# Patient Record
Sex: Female | Born: 1935 | Race: White | Hispanic: No | Smoking: Former smoker
Health system: Southern US, Community
[De-identification: ages and names within clinical notes are randomized; demographics above are authoritative.]

## PROBLEM LIST (undated history)

## (undated) DIAGNOSIS — F419 Anxiety disorder, unspecified: Secondary | ICD-10-CM

## (undated) DIAGNOSIS — S32591A Other specified fracture of right pubis, initial encounter for closed fracture: Secondary | ICD-10-CM

## (undated) DIAGNOSIS — S32599A Other specified fracture of unspecified pubis, initial encounter for closed fracture: Secondary | ICD-10-CM

## (undated) DIAGNOSIS — S32609A Unspecified fracture of unspecified ischium, initial encounter for closed fracture: Secondary | ICD-10-CM

## (undated) DIAGNOSIS — F0391 Unspecified dementia with behavioral disturbance: Secondary | ICD-10-CM

## (undated) DIAGNOSIS — F0390 Unspecified dementia without behavioral disturbance: Secondary | ICD-10-CM

## (undated) DIAGNOSIS — F03918 Unspecified dementia, unspecified severity, with other behavioral disturbance: Secondary | ICD-10-CM

## (undated) DIAGNOSIS — S32502A Unspecified fracture of left pubis, initial encounter for closed fracture: Secondary | ICD-10-CM

## (undated) DIAGNOSIS — I10 Essential (primary) hypertension: Secondary | ICD-10-CM

## (undated) DIAGNOSIS — M1990 Unspecified osteoarthritis, unspecified site: Secondary | ICD-10-CM

## (undated) DIAGNOSIS — L8990 Pressure ulcer of unspecified site, unspecified stage: Secondary | ICD-10-CM

## (undated) DIAGNOSIS — S329XXA Fracture of unspecified parts of lumbosacral spine and pelvis, initial encounter for closed fracture: Secondary | ICD-10-CM

## (undated) DIAGNOSIS — S32592A Other specified fracture of left pubis, initial encounter for closed fracture: Secondary | ICD-10-CM

## (undated) DIAGNOSIS — D649 Anemia, unspecified: Secondary | ICD-10-CM

## (undated) DIAGNOSIS — S42201A Unspecified fracture of upper end of right humerus, initial encounter for closed fracture: Secondary | ICD-10-CM

## (undated) DIAGNOSIS — D696 Thrombocytopenia, unspecified: Secondary | ICD-10-CM

## (undated) DIAGNOSIS — R251 Tremor, unspecified: Secondary | ICD-10-CM

## (undated) DIAGNOSIS — N179 Acute kidney failure, unspecified: Secondary | ICD-10-CM

## (undated) HISTORY — DX: Unspecified dementia with behavioral disturbance: F03.91

## (undated) HISTORY — DX: Unspecified dementia, unspecified severity, with other behavioral disturbance: F03.918

## (undated) HISTORY — DX: Acute kidney failure, unspecified: N17.9

## (undated) HISTORY — PX: WRIST SURGERY: SHX841

## (undated) HISTORY — DX: Essential (primary) hypertension: I10

## (undated) HISTORY — DX: Fracture of unspecified parts of lumbosacral spine and pelvis, initial encounter for closed fracture: S32.9XXA

## (undated) HISTORY — PX: ADENOIDECTOMY: SHX5191

## (undated) HISTORY — DX: Unspecified fracture of left pubis, initial encounter for closed fracture: S32.502A

## (undated) HISTORY — DX: Unspecified fracture of unspecified ischium, initial encounter for closed fracture: S32.609A

## (undated) HISTORY — DX: Tremor, unspecified: R25.1

## (undated) HISTORY — DX: Thrombocytopenia, unspecified: D69.6

## (undated) HISTORY — DX: Other specified fracture of right pubis, initial encounter for closed fracture: S32.591A

## (undated) HISTORY — DX: Unspecified fracture of upper end of right humerus, initial encounter for closed fracture: S42.201A

## (undated) HISTORY — PX: TONSILLECTOMY: SUR1361

## (undated) HISTORY — DX: Pressure ulcer of unspecified site, unspecified stage: L89.90

## (undated) HISTORY — DX: Other specified fracture of left pubis, initial encounter for closed fracture: S32.592A

## (undated) HISTORY — DX: Other specified fracture of unspecified pubis, initial encounter for closed fracture: S32.599A

## (undated) HISTORY — PX: SHOULDER SURGERY: SHX246

---

## 1999-10-11 ENCOUNTER — Emergency Department (HOSPITAL_COMMUNITY): Admission: EM | Admit: 1999-10-11 | Discharge: 1999-10-11 | Payer: Self-pay | Admitting: Emergency Medicine

## 1999-10-11 ENCOUNTER — Encounter: Payer: Self-pay | Admitting: Emergency Medicine

## 1999-10-28 ENCOUNTER — Encounter: Payer: Self-pay | Admitting: Orthopedic Surgery

## 1999-10-28 ENCOUNTER — Inpatient Hospital Stay (HOSPITAL_COMMUNITY): Admission: RE | Admit: 1999-10-28 | Discharge: 1999-10-30 | Payer: Self-pay | Admitting: Orthopedic Surgery

## 2010-11-14 ENCOUNTER — Encounter: Payer: Self-pay | Admitting: *Deleted

## 2010-11-14 ENCOUNTER — Emergency Department (HOSPITAL_COMMUNITY): Payer: Medicare Other

## 2010-11-14 ENCOUNTER — Emergency Department (HOSPITAL_COMMUNITY)
Admission: EM | Admit: 2010-11-14 | Discharge: 2010-11-14 | Disposition: A | Discharge status: Home / Self Care | Payer: Medicare Other | Attending: Emergency Medicine | Admitting: Emergency Medicine

## 2010-11-14 ENCOUNTER — Other Ambulatory Visit: Payer: Self-pay

## 2010-11-14 DIAGNOSIS — R5383 Other fatigue: Secondary | ICD-10-CM | POA: Insufficient documentation

## 2010-11-14 DIAGNOSIS — R5381 Other malaise: Secondary | ICD-10-CM | POA: Insufficient documentation

## 2010-11-14 DIAGNOSIS — R0602 Shortness of breath: Secondary | ICD-10-CM | POA: Insufficient documentation

## 2010-11-14 DIAGNOSIS — R002 Palpitations: Secondary | ICD-10-CM | POA: Insufficient documentation

## 2010-11-14 LAB — DIFFERENTIAL
Basophils Absolute: 0 10*3/uL (ref 0.0–0.1)
Eosinophils Absolute: 0 10*3/uL (ref 0.0–0.7)
Lymphs Abs: 1.3 10*3/uL (ref 0.7–4.0)
Neutrophils Relative %: 66 % (ref 43–77)

## 2010-11-14 LAB — COMPREHENSIVE METABOLIC PANEL
BUN: 16 mg/dL (ref 6–23)
CO2: 25 mEq/L (ref 19–32)
Calcium: 9.7 mg/dL (ref 8.4–10.5)
Chloride: 105 mEq/L (ref 96–112)
Creatinine, Ser: 0.94 mg/dL (ref 0.50–1.10)
GFR calc Af Amer: 66 mL/min — ABNORMAL LOW (ref >90)
GFR calc non Af Amer: 57 mL/min — ABNORMAL LOW (ref >90)
Glucose, Bld: 89 mg/dL (ref 70–99)
Total Bilirubin: 0.4 mg/dL (ref 0.3–1.2)

## 2010-11-14 LAB — CBC
MCH: 31 pg (ref 26.0–34.0)
MCHC: 34.1 g/dL (ref 30.0–36.0)
Platelets: 173 10*3/uL (ref 150–400)
RBC: 4.48 MIL/uL (ref 3.87–5.11)

## 2010-11-14 LAB — POCT I-STAT TROPONIN I: Troponin i, poc: 0 ng/mL (ref 0.00–0.08)

## 2010-11-14 NOTE — ED Provider Notes (Cosign Needed)
History     CSN: [id number] Arrival date & time: 11/14/2010 10:27 AM   First MD Initiated Contact with Patient 11/14/10 1144      Chief Complaint  Patient presents with  . Tachycardia    (Consider location/radiation/quality/duration/timing/severity/associated sxs/prior treatment) Patient is a 77 y.o. female presenting with palpitations. The history is provided by the patient (the pt states she has had some episodes of palpitaions.  today the palpitaions lasted about one hour).  Palpitations  This is a recurrent problem. The current episode started more than 2 days ago. The problem occurs rarely. The problem has been resolved. Associated with: nothing. Pertinent negatives include no diaphoresis, no fever, no chest pain, no orthopnea, no abdominal pain, no headaches, no back pain, no weakness and no cough. She has tried nothing for the symptoms. Risk factors include no known risk factors. Her past medical history does not include hyperthyroidism.    History reviewed. No pertinent past medical history.  Past Surgical History  Procedure Date  . Wrist surgery   . Tonsillectomy   . Adenoidectomy   . Shoulder surgery     right    No family history on file.  History  Substance Use Topics  . Smoking status: Former Games developer  . Smokeless tobacco: Not on file  . Alcohol Use: Yes     occassional wine    OB History    Grav Para Term Preterm Abortions TAB SAB Ect Mult Living                  Review of Systems  Constitutional: Negative for fever, diaphoresis and fatigue.  HENT: Negative for congestion, sinus pressure and ear discharge.   Eyes: Negative for discharge.  Respiratory: Negative for cough.   Cardiovascular: Positive for palpitations. Negative for chest pain and orthopnea.  Gastrointestinal: Negative for abdominal pain and diarrhea.  Genitourinary: Negative for frequency and hematuria.  Musculoskeletal: Negative for back pain.  Skin: Negative for rash.  Neurological:  Negative for seizures, weakness and headaches.  Hematological: Negative.   Psychiatric/Behavioral: Negative for hallucinations.    Allergies  Ether; Penicillins; and Amitriptyline  Home Medications   Current Outpatient Rx  Name Route Sig Dispense Refill  . B COMPLEX PO TABS Oral Take 1 tablet by mouth daily.      Marland Kitchen OVER THE COUNTER MEDICATION Oral Take 3 tablets by mouth 2 (two) times daily. Gi revive     . OVER THE COUNTER MEDICATION Oral Take 2 tablets by mouth daily. alginol     . OVER THE COUNTER MEDICATION Oral Take 1 tablet by mouth 3 (three) times daily. Prima calm     . OVER THE COUNTER MEDICATION Oral Take 6 tablets by mouth daily as needed. For anxiety. Ignatia amara     . OVER THE COUNTER MEDICATION Oral Take 1 tablet by mouth 3 (three) times daily. paramin     . OVER THE COUNTER MEDICATION Oral Take 1 tablet by mouth 2 (two) times daily.      Marland Kitchen OVER THE COUNTER MEDICATION Oral Take 1 tablet by mouth 2 (two) times daily. Vitamin b-12, b-6 folic     . OVER THE COUNTER MEDICATION Oral Take 1 tablet by mouth 2 (two) times daily. Fem herb     . OVER THE COUNTER MEDICATION Oral Take 1 tablet by mouth daily. Cardio plus     . OVER THE COUNTER MEDICATION Oral Take 1 tablet by mouth 4 (four) times daily. White parasite     .  OVER THE COUNTER MEDICATION Oral Take 1 tablet by mouth 2 (two) times daily. Kern-700       BP 152/79  Pulse 79  Temp(Src) 97.7 F (36.5 C) (Oral)  Resp 16  Wt 127 lb (57.607 kg)  SpO2 100%  Physical Exam  Constitutional: She is oriented to person, place, and time. She appears well-developed.  HENT:  Head: Normocephalic and atraumatic.  Eyes: Conjunctivae and EOM are normal. No scleral icterus.  Neck: Neck supple. No thyromegaly present.  Cardiovascular: Normal rate and regular rhythm.  Exam reveals no gallop and no friction rub.   No murmur heard. Pulmonary/Chest: No stridor. She has no wheezes. She has no rales. She exhibits no tenderness.    Abdominal: She exhibits no distension. There is no tenderness. There is no rebound.  Musculoskeletal: Normal range of motion. She exhibits no edema.  Lymphadenopathy:    She has no cervical adenopathy.  Neurological: She is oriented to person, place, and time. Coordination normal.  Skin: No rash noted. No erythema.  Psychiatric: She has a normal mood and affect. Her behavior is normal.    ED Course  Procedures (including critical care time)  Labs Reviewed  COMPREHENSIVE METABOLIC PANEL - Abnormal; Notable for the following:    GFR calc non Af Amer 57 (*)    GFR calc Af Amer 66 (*)    All other components within normal limits  CBC  DIFFERENTIAL  POCT I-STAT TROPONIN I  I-STAT TROPONIN I   Dg Chest 2 View  11/14/2010  *RADIOLOGY REPORT*  Clinical Data: Palpitations, weakness, shortness of breath  CHEST - 2 VIEW  Comparison: None  Findings: Normal heart size and pulmonary vascularity. Tortuous thoracic aorta. Emphysematous changes without infiltrate or effusion. No pneumothorax. Bones diffusely demineralized.  IMPRESSION: Emphysematous changes. No acute abnormalities.  Original Report Authenticated By: Lollie Marrow, M.D.     1. Palpitations      Date: 11/14/2010  Rate96  Rhythm: normal sinus rhythm  QRS Axis: normal  Intervals: normal  ST/T Wave abnormalities: normal  Conduction Disutrbances:none  Narrative Interpretation:   Old EKG Reviewed: none available  Results for orders placed during the hospital encounter of 11/14/10  CBC      Component Value Range   WBC 5.5  4.0 - 10.5 (K/uL)   RBC 4.48  3.87 - 5.11 (MIL/uL)   Hemoglobin 13.9  12.0 - 15.0 (g/dL)   HCT 16.1  09.6 - 04.5 (%)   MCV 91.1  78.0 - 100.0 (fL)   MCH 31.0  26.0 - 34.0 (pg)   MCHC 34.1  30.0 - 36.0 (g/dL)   RDW 40.9  81.1 - 91.4 (%)   Platelets 173  150 - 400 (K/uL)  DIFFERENTIAL      Component Value Range   Neutrophils Relative 66  43 - 77 (%)   Neutro Abs 3.6  1.7 - 7.7 (K/uL)   Lymphocytes  Relative 23  12 - 46 (%)   Lymphs Abs 1.3  0.7 - 4.0 (K/uL)   Monocytes Relative 11  3 - 12 (%)   Monocytes Absolute 0.6  0.1 - 1.0 (K/uL)   Eosinophils Relative 1  0 - 5 (%)   Eosinophils Absolute 0.0  0.0 - 0.7 (K/uL)   Basophils Relative 0  0 - 1 (%)   Basophils Absolute 0.0  0.0 - 0.1 (K/uL)  COMPREHENSIVE METABOLIC PANEL      Component Value Range   Sodium 140  135 - 145 (mEq/L)  Potassium 4.0  3.5 - 5.1 (mEq/L)   Chloride 105  96 - 112 (mEq/L)   CO2 25  19 - 32 (mEq/L)   Glucose, Bld 89  70 - 99 (mg/dL)   BUN 16  6 - 23 (mg/dL)   Creatinine, Ser 2.95  0.50 - 1.10 (mg/dL)   Calcium 9.7  8.4 - 62.1 (mg/dL)   Total Protein 6.5  6.0 - 8.3 (g/dL)   Albumin 3.9  3.5 - 5.2 (g/dL)   AST 24  0 - 37 (U/L)   ALT 19  0 - 35 (U/L)   Alkaline Phosphatase 54  39 - 117 (U/L)   Total Bilirubin 0.4  0.3 - 1.2 (mg/dL)   GFR calc non Af Amer 57 (*) >90 (mL/min)   GFR calc Af Amer 66 (*) >90 (mL/min)  POCT I-STAT TROPONIN I      Component Value Range   Troponin i, poc 0.00  0.00 - 0.08 (ng/mL)   Comment 3            Dg Chest 2 View  11/14/2010  *RADIOLOGY REPORT*  Clinical Data: Palpitations, weakness, shortness of breath  CHEST - 2 VIEW  Comparison: None  Findings: Normal heart size and pulmonary vascularity. Tortuous thoracic aorta. Emphysematous changes without infiltrate or effusion. No pneumothorax. Bones diffusely demineralized.  IMPRESSION: Emphysematous changes. No acute abnormalities.  Original Report Authenticated By: Lollie Marrow, M.D.      MDM  Palpitaions,  Anxiety, cardiac problem, chest muscle spasm        Benny Lennert, MD 11/15/10 1009

## 2010-11-14 NOTE — ED Notes (Signed)
Pt a/o x 4, resting quietly, skin warm and dry, respirations even and unlabored, no acute distress noted, no needs identified at this time, awaiting disposition

## 2010-11-14 NOTE — ED Notes (Signed)
Patient is resting comfortably. Pt given po fluids, tolerating well, no acute distress noted, awaiting disposition, family remains at bedside

## 2010-11-14 NOTE — ED Notes (Signed)
Pt states "I've had this before and I take the med and it helps, had an episode yesterday but today it has lasted longer than it ever has before"

## 2010-11-14 NOTE — ED Notes (Signed)
Pt. Ambulated to the bathroom, gait steady.  

## 2010-11-17 NOTE — Discharge Instructions (Signed)
Follow up with your md next week.  Return as neededy

## 2012-12-28 ENCOUNTER — Emergency Department (HOSPITAL_COMMUNITY): Payer: Medicare Other

## 2012-12-28 ENCOUNTER — Emergency Department (HOSPITAL_COMMUNITY)
Admission: EM | Admit: 2012-12-28 | Discharge: 2012-12-28 | Disposition: A | Discharge status: Home / Self Care | Payer: Medicare Other | Attending: Emergency Medicine | Admitting: Emergency Medicine

## 2012-12-28 ENCOUNTER — Encounter (HOSPITAL_COMMUNITY): Payer: Self-pay | Admitting: Emergency Medicine

## 2012-12-28 DIAGNOSIS — Z87891 Personal history of nicotine dependence: Secondary | ICD-10-CM | POA: Insufficient documentation

## 2012-12-28 DIAGNOSIS — R000 Tachycardia, unspecified: Secondary | ICD-10-CM | POA: Insufficient documentation

## 2012-12-28 DIAGNOSIS — R0989 Other specified symptoms and signs involving the circulatory and respiratory systems: Secondary | ICD-10-CM | POA: Insufficient documentation

## 2012-12-28 DIAGNOSIS — R531 Weakness: Secondary | ICD-10-CM

## 2012-12-28 DIAGNOSIS — J3489 Other specified disorders of nose and nasal sinuses: Secondary | ICD-10-CM | POA: Insufficient documentation

## 2012-12-28 DIAGNOSIS — R5381 Other malaise: Secondary | ICD-10-CM | POA: Insufficient documentation

## 2012-12-28 DIAGNOSIS — R630 Anorexia: Secondary | ICD-10-CM | POA: Insufficient documentation

## 2012-12-28 DIAGNOSIS — IMO0001 Reserved for inherently not codable concepts without codable children: Secondary | ICD-10-CM | POA: Insufficient documentation

## 2012-12-28 DIAGNOSIS — R42 Dizziness and giddiness: Secondary | ICD-10-CM | POA: Insufficient documentation

## 2012-12-28 DIAGNOSIS — Z792 Long term (current) use of antibiotics: Secondary | ICD-10-CM | POA: Insufficient documentation

## 2012-12-28 DIAGNOSIS — Z880 Allergy status to penicillin: Secondary | ICD-10-CM | POA: Insufficient documentation

## 2012-12-28 LAB — TROPONIN I: Troponin I: <0.3 ng/mL (ref <0.30)

## 2012-12-28 LAB — CBC WITH DIFFERENTIAL/PLATELET
Basophils Absolute: 0 10*3/uL (ref 0.0–0.1)
Eosinophils Absolute: 0 10*3/uL (ref 0.0–0.7)
HCT: 42.2 % (ref 36.0–46.0)
Lymphocytes Relative: 8 % — ABNORMAL LOW (ref 12–46)
MCHC: 33.9 g/dL (ref 30.0–36.0)
Monocytes Relative: 15 % — ABNORMAL HIGH (ref 3–12)
Neutro Abs: 4.5 10*3/uL (ref 1.7–7.7)
Neutrophils Relative %: 77 % (ref 43–77)
Platelets: 113 10*3/uL — ABNORMAL LOW (ref 150–400)
RDW: 13.9 % (ref 11.5–15.5)
WBC: 5.9 10*3/uL (ref 4.0–10.5)

## 2012-12-28 LAB — HEPATIC FUNCTION PANEL
ALT: 30 U/L (ref 0–35)
Alkaline Phosphatase: 73 U/L (ref 39–117)
Bilirubin, Direct: 0.1 mg/dL (ref 0.0–0.3)
Indirect Bilirubin: 0.4 mg/dL (ref 0.3–0.9)

## 2012-12-28 LAB — URINALYSIS, ROUTINE W REFLEX MICROSCOPIC
Ketones, ur: 15 mg/dL — AB
Leukocytes, UA: NEGATIVE
Nitrite: NEGATIVE
Protein, ur: 30 mg/dL — AB
pH: 6 (ref 5.0–8.0)

## 2012-12-28 LAB — BASIC METABOLIC PANEL
CO2: 25 mEq/L (ref 19–32)
Chloride: 100 mEq/L (ref 96–112)
Creatinine, Ser: 0.99 mg/dL (ref 0.50–1.10)
GFR calc Af Amer: 61 mL/min — ABNORMAL LOW (ref >90)
Sodium: 137 mEq/L (ref 135–145)

## 2012-12-28 LAB — URINE MICROSCOPIC-ADD ON

## 2012-12-28 LAB — POCT I-STAT TROPONIN I: Troponin i, poc: 0 ng/mL (ref 0.00–0.08)

## 2012-12-28 MED ORDER — SODIUM CHLORIDE 0.9 % IV BOLUS (SEPSIS): 1000.0000 mL | Freq: Once | INTRAVENOUS | Status: DC

## 2012-12-28 MED ORDER — SODIUM CHLORIDE 0.9 % IV BOLUS (SEPSIS)
1000.0000 mL | Freq: Once | INTRAVENOUS | Status: AC
  Administered 2012-12-28: 1000 mL via INTRAVENOUS

## 2012-12-28 MED ORDER — CEPHALEXIN 500 MG PO CAPS: 500.0000 mg | ORAL_CAPSULE | Freq: Four times a day (QID) | ORAL | Status: DC

## 2012-12-28 MED ORDER — IOHEXOL 350 MG/ML SOLN
100.0000 mL | Freq: Once | INTRAVENOUS | Status: AC | PRN
  Administered 2012-12-28: 75 mL via INTRAVENOUS

## 2012-12-28 NOTE — ED Provider Notes (Signed)
CSN: [id number]     Arrival date & time 12/28/12  1236 History   First MD Initiated Contact with Patient 12/28/12 1344     Chief Complaint  Patient presents with  . Weakness  . Dizziness  . Chest Congestion    (Consider location/radiation/quality/duration/timing/severity/associated sxs/prior Treatment) HPI Comments: Patient presents with a three-day history of generalized weakness. Contrary to triage note she denies dizziness. Denies lightheadedness or vertigo. She denies chest pain. She denies shortness of breath. Daughter reports she's had some congestion in her cough and not eating and drinking very well. She denies any fevers, nausea, vomiting or abdominal pain. She denies chest pain, headache, back pain. She's had no recent travel. She denied a flu shot. She's had no sick contacts.  The history is provided by the patient.    History reviewed. No pertinent past medical history. Past Surgical History  Procedure Laterality Date  . Wrist surgery    . Tonsillectomy    . Adenoidectomy    . Shoulder surgery      right   No family history on file. History  Substance Use Topics  . Smoking status: Former Games developer  . Smokeless tobacco: Not on file  . Alcohol Use: Yes     Comment: occassional wine   OB History   Grav Para Term Preterm Abortions TAB SAB Ect Mult Living                 Review of Systems  Constitutional: Positive for activity change, appetite change and fatigue. Negative for fever.  HENT: Positive for congestion and rhinorrhea.   Respiratory: Negative for cough, chest tightness and shortness of breath.   Cardiovascular: Negative for chest pain.  Gastrointestinal: Negative for nausea, vomiting and abdominal pain.  Genitourinary: Negative for dysuria and hematuria.  Musculoskeletal: Positive for arthralgias and myalgias. Negative for back pain.  Neurological: Positive for dizziness and weakness. Negative for light-headedness.  A complete 10 system review of systems  was obtained and all systems are negative except as noted in the HPI and PMH.    Allergies  Ether; Penicillins; and Amitriptyline  Home Medications   Current Outpatient Rx  Name  Route  Sig  Dispense  Refill  . OVER THE COUNTER MEDICATION   Oral   Take 1 tablet by mouth 3 (three) times daily. paramin         . OVER THE COUNTER MEDICATION   Oral   Take 1 tablet by mouth 2 (two) times daily. Fem herb         . OVER THE COUNTER MEDICATION   Oral   Take 1 tablet by mouth daily. Cardio plus          . cephALEXin (KEFLEX) 500 MG capsule   Oral   Take 1 capsule (500 mg total) by mouth 4 (four) times daily.   40 capsule   0   . OVER THE COUNTER MEDICATION   Oral   Take 3 tablets by mouth 2 (two) times daily. Gi revive          BP 128/71  Pulse 103  Temp(Src) 98.5 F (36.9 C) (Oral)  Resp 16  SpO2 96% Physical Exam  Constitutional: She is oriented to person, place, and time. She appears well-developed and well-nourished. No distress.  HENT:  Head: Normocephalic and atraumatic.  Mouth/Throat: No oropharyngeal exudate.  Eyes: Conjunctivae and EOM are normal. Pupils are equal, round, and reactive to light.  Neck: Normal range of motion. Neck supple.  Cardiovascular:  Normal rate, normal heart sounds and intact distal pulses.   No murmur heard. tachycardic  Pulmonary/Chest: Effort normal and breath sounds normal. No respiratory distress.  Abdominal: Soft. There is no tenderness. There is no rebound and no guarding.  Musculoskeletal: Normal range of motion. She exhibits no edema and no tenderness.  +2 DP and radial pulses  Neurological: She is alert and oriented to person, place, and time. No cranial nerve deficit. She exhibits normal muscle tone. Coordination normal.  CN 2-12 intact, no ataxia on finger to nose, no nystagmus, 5/5 strength throughout, no pronator drift, Romberg negative, normal gait.   Skin: Skin is warm.    ED Course  Procedures (including  critical care time) Labs Review Labs Reviewed  CBC WITH DIFFERENTIAL - Abnormal; Notable for the following:    Platelets 113 (*)    Lymphocytes Relative 8 (*)    Monocytes Relative 15 (*)    Lymphs Abs 0.5 (*)    All other components within normal limits  BASIC METABOLIC PANEL - Abnormal; Notable for the following:    Glucose, Bld 146 (*)    GFR calc non Af Amer 53 (*)    GFR calc Af Amer 61 (*)    All other components within normal limits  URINALYSIS, ROUTINE W REFLEX MICROSCOPIC - Abnormal; Notable for the following:    Hgb urine dipstick LARGE (*)    Bilirubin Urine SMALL (*)    Ketones, ur 15 (*)    Protein, ur 30 (*)    All other components within normal limits  PRO B NATRIURETIC PEPTIDE - Abnormal; Notable for the following:    Pro B Natriuretic peptide (BNP) 592.7 (*)    All other components within normal limits  D-DIMER, QUANTITATIVE - Abnormal; Notable for the following:    D-Dimer, Quant 0.78 (*)    All other components within normal limits  HEPATIC FUNCTION PANEL - Abnormal; Notable for the following:    AST 39 (*)    All other components within normal limits  URINE MICROSCOPIC-ADD ON - Abnormal; Notable for the following:    Squamous Epithelial / LPF FEW (*)    Bacteria, UA MANY (*)    All other components within normal limits  URINE CULTURE  TROPONIN I  POCT I-STAT TROPONIN I   Imaging Review Dg Chest 2 View  12/28/2012   CLINICAL DATA:  Weakness, dizziness and shortness of breath.  EXAM: CHEST - 2 VIEW  COMPARISON:  11/14/2010  FINDINGS: Lungs show stable COPD. No infiltrates, edema, nodule or pneumothorax are identified. No pleural fluid is seen. The heart size and mediastinal contours are stable. Stable degenerative changes and scoliosis present of the thoracic spine.  IMPRESSION: Stable COPD.  No acute findings.   Electronically Signed   By: Irish Lack M.D.   On: 12/28/2012 13:16   Ct Angio Chest Pe W/cm &/or Wo Cm  12/28/2012   CLINICAL DATA:   Shortness of breath.  EXAM: CT ANGIOGRAPHY CHEST WITH CONTRAST  TECHNIQUE: Multidetector CT imaging of the chest was performed using the standard protocol during bolus administration of intravenous contrast. Multiplanar CT image reconstructions including MIPs were obtained to evaluate the vascular anatomy.  CONTRAST:  75 mL OMNIPAQUE IOHEXOL 350 MG/ML SOLN  COMPARISON:  PA and lateral chest 12/28/2012 at 1304 hr.  FINDINGS: No pulmonary embolus is identified. There is no pleural or pericardial effusion. Heart size is mildly enlarged. The thyroid gland is enlarged and heterogeneous without discrete lesion. No axillary, hilar or  mediastinal lymphadenopathy. Lungs demonstrate only mild dependent atelectasis. Visualized intra-abdominal contents show no focal abnormality. Thoracic scoliosis is seen. No lytic or sclerotic lesion is identified.  Review of the MIP images confirms the above findings.  IMPRESSION: Negative for pulmonary embolus.  No acute disease.   Electronically Signed   By: Drusilla Kanner M.D.   On: 12/28/2012 16:48    EKG Interpretation    Date/Time:  Wednesday December 28 2012 12:53:40 EST Ventricular Rate:  127 PR Interval:  148 QRS Duration: 79 QT Interval:  296 QTC Calculation: 430 R Axis:   -97 Text Interpretation:  Sinus tachycardia Left anterior fascicular block Low voltage, extremity and precordial leads RSR' in V1 or V2, right VCD or RVH Consider anterior infarct Baseline wander in lead(s) V6 Rate faster Confirmed by Ziare Cryder  MD, Edwyn Inclan ([id number]) on 12/28/2012 2:02:31 PM            MDM   1. Weakness    3 days of generalized weakness.  Tachycardia on exam and appears dry. Will give IVF and check labs. No focal neuro deficits.  Has had exposure to flu.  No fever. EKG with sinus tach Blood, ketones and bacteria in urine. Culture sent.  No flank pain or dysuria. No back or abdominal pain. D-dimer elevated.  CTPE negative. HR improved to 90s on recheck tolerating PO and  ambulatory, requesting to go home. Follow up with PCP this week. Return precautions discussed. BP 128/71  Pulse 103  Temp(Src) 98.5 F (36.9 C) (Oral)  Resp 16  SpO2 96%     Glynn Octave, MD 12/28/12 [phone]

## 2012-12-28 NOTE — ED Notes (Signed)
Pt's daughter reports weakness and cough-reports pt has been around someone with the flu over the weekend.  Pt is A&Ox 4.

## 2012-12-28 NOTE — ED Notes (Signed)
Per pt, states increased weakness since Sunday, dizziness and productive cough and chest congestion

## 2012-12-28 NOTE — ED Notes (Signed)
Bed: WA15 Expected date:  Expected time:  Means of arrival:  Comments: Triage 1  

## 2012-12-28 NOTE — ED Notes (Signed)
Patient is resting comfortably. Patient tolerated fluid trial well

## 2012-12-28 NOTE — ED Notes (Signed)
IV was d/c'd CDI

## 2012-12-28 NOTE — Progress Notes (Signed)
   CARE MANAGEMENT ED NOTE 12/28/2012  Patient:  Yolanda Shaw, Yolanda Shaw   Account Number:  [id number]  Date Initiated:  12/28/2012  Documentation initiated by:  Edd Arbour  Subjective/Objective Assessment:   79 yr old [employer] choice ppo states pcp is Dr Charlette Caffey in [place]     Subjective/Objective Assessment Detail:     Action/Plan:   EPIC updated   Action/Plan Detail:   Anticipated DC Date:  12/28/2012     Status Recommendation to Physician:   Result of Recommendation:    Other ED Services  Consult Working Plan    DC Planning Services  PCP issues  Other  Outpatient Services - Pt will follow up    Choice offered to / List presented to:            Status of service:    ED Comments:   ED Comments Detail:

## 2012-12-29 LAB — URINE CULTURE: Culture: NO GROWTH

## 2013-01-03 ENCOUNTER — Emergency Department (HOSPITAL_COMMUNITY): Payer: Medicare Other

## 2013-01-03 ENCOUNTER — Encounter (HOSPITAL_COMMUNITY): Payer: Self-pay | Admitting: Emergency Medicine

## 2013-01-03 ENCOUNTER — Emergency Department (HOSPITAL_COMMUNITY)
Admission: EM | Admit: 2013-01-03 | Discharge: 2013-01-03 | Disposition: A | Discharge status: Home / Self Care | Payer: Medicare Other | Attending: Emergency Medicine | Admitting: Emergency Medicine

## 2013-01-03 DIAGNOSIS — R0600 Dyspnea, unspecified: Secondary | ICD-10-CM

## 2013-01-03 DIAGNOSIS — R748 Abnormal levels of other serum enzymes: Secondary | ICD-10-CM

## 2013-01-03 DIAGNOSIS — R0609 Other forms of dyspnea: Secondary | ICD-10-CM | POA: Insufficient documentation

## 2013-01-03 DIAGNOSIS — Z792 Long term (current) use of antibiotics: Secondary | ICD-10-CM | POA: Insufficient documentation

## 2013-01-03 DIAGNOSIS — R0989 Other specified symptoms and signs involving the circulatory and respiratory systems: Secondary | ICD-10-CM | POA: Insufficient documentation

## 2013-01-03 DIAGNOSIS — Z87891 Personal history of nicotine dependence: Secondary | ICD-10-CM | POA: Insufficient documentation

## 2013-01-03 DIAGNOSIS — Z880 Allergy status to penicillin: Secondary | ICD-10-CM | POA: Insufficient documentation

## 2013-01-03 LAB — CBC WITH DIFFERENTIAL/PLATELET
Basophils Absolute: 0 10*3/uL (ref 0.0–0.1)
Eosinophils Relative: 0 % (ref 0–5)
HCT: 43.5 % (ref 36.0–46.0)
Hemoglobin: 14.7 g/dL (ref 12.0–15.0)
Lymphocytes Relative: 30 % (ref 12–46)
Lymphs Abs: 1.4 10*3/uL (ref 0.7–4.0)
MCV: 91.4 fL (ref 78.0–100.0)
Monocytes Absolute: 0.4 10*3/uL (ref 0.1–1.0)
Monocytes Relative: 9 % (ref 3–12)
Neutrophils Relative %: 61 % (ref 43–77)
RBC: 4.76 MIL/uL (ref 3.87–5.11)
RDW: 13.4 % (ref 11.5–15.5)
WBC: 4.6 10*3/uL (ref 4.0–10.5)

## 2013-01-03 LAB — COMPREHENSIVE METABOLIC PANEL
ALT: 20 U/L (ref 0–35)
CO2: 26 mEq/L (ref 19–32)
Calcium: 9.6 mg/dL (ref 8.4–10.5)
Chloride: 101 mEq/L (ref 96–112)
Creatinine, Ser: 0.96 mg/dL (ref 0.50–1.10)
GFR calc Af Amer: 64 mL/min — ABNORMAL LOW (ref >90)
GFR calc non Af Amer: 55 mL/min — ABNORMAL LOW (ref >90)
Glucose, Bld: 88 mg/dL (ref 70–99)
Potassium: 4.3 mEq/L (ref 3.7–5.3)
Sodium: 139 mEq/L (ref 137–147)
Total Bilirubin: 0.4 mg/dL (ref 0.3–1.2)

## 2013-01-03 LAB — URINALYSIS, ROUTINE W REFLEX MICROSCOPIC
Glucose, UA: NEGATIVE mg/dL
Ketones, ur: NEGATIVE mg/dL
Leukocytes, UA: NEGATIVE
Protein, ur: NEGATIVE mg/dL
Urobilinogen, UA: 0.2 mg/dL (ref 0.0–1.0)

## 2013-01-03 LAB — URINE MICROSCOPIC-ADD ON

## 2013-01-03 MED ORDER — SODIUM CHLORIDE 0.9 % IV BOLUS (SEPSIS): 1000.0000 mL | Freq: Once | INTRAVENOUS | Status: DC

## 2013-01-03 MED ORDER — SODIUM CHLORIDE 0.9 % IV BOLUS (SEPSIS)
1000.0000 mL | Freq: Once | INTRAVENOUS | Status: AC
  Administered 2013-01-03: 1000 mL via INTRAVENOUS

## 2013-01-03 NOTE — ED Provider Notes (Signed)
CSN: [id number]     Arrival date & time 01/03/13  1539 History   First MD Initiated Contact with Patient 01/03/13 1549     Chief Complaint  Patient presents with  . Shortness of Breath   (Consider location/radiation/quality/duration/timing/severity/associated sxs/prior Treatment) HPI Patient presents with concerns of dyspnea. Symptoms began earlier today without clear precipitant. Since onset symptoms of persistent, with no clear alleviating or exacerbating factors.  There is no concurrent pain, lightheadedness, syncope, nausea, vomiting. There is mild associated congestion. Patient notes that after evaluation here one week ago she was generally well.  She states this presentation is similar, but not to the same as last week's evaluation here.  She has not seen a physician in the interim.  History reviewed. No pertinent past medical history. Past Surgical History  Procedure Laterality Date  . Wrist surgery    . Tonsillectomy    . Adenoidectomy    . Shoulder surgery      right   History reviewed. No pertinent family history. History  Substance Use Topics  . Smoking status: Former Games developer  . Smokeless tobacco: Not on file  . Alcohol Use: Yes     Comment: occassional wine   OB History   Grav Para Term Preterm Abortions TAB SAB Ect Mult Living                 Review of Systems  Constitutional:       Per HPI, otherwise negative  HENT:       Per HPI, otherwise negative  Respiratory:       Per HPI, otherwise negative  Cardiovascular:       Per HPI, otherwise negative  Gastrointestinal: Negative for vomiting.  Endocrine:       Negative aside from HPI  Genitourinary:       Neg aside from HPI   Musculoskeletal:       Per HPI, otherwise negative  Skin: Negative.   Neurological: Negative for syncope.    Allergies  Ether; Penicillins; and Amitriptyline  Home Medications   Current Outpatient Rx  Name  Route  Sig  Dispense  Refill  . cephALEXin (KEFLEX) 500 MG  capsule   Oral   Take 1 capsule (500 mg total) by mouth 4 (four) times daily.   40 capsule   0   . OVER THE COUNTER MEDICATION   Oral   Take 1 tablet by mouth 3 (three) times daily. paramin         . OVER THE COUNTER MEDICATION   Oral   Take 1 tablet by mouth 2 (two) times daily. Fem herb         . OVER THE COUNTER MEDICATION   Oral   Take 1 tablet by mouth daily. Cardio plus          . OVER THE COUNTER MEDICATION   Oral   Take 3 tablets by mouth 2 (two) times daily. Gi revive          BP 169/105  Pulse 96  Temp(Src) 97.6 F (36.4 C) (Oral)  Resp 16  SpO2 99% Physical Exam  Nursing note and vitals reviewed. Constitutional: She is oriented to person, place, and time. She appears well-developed and well-nourished. No distress.  HENT:  Head: Normocephalic and atraumatic.  Eyes: Conjunctivae and EOM are normal.  Cardiovascular: Normal rate and regular rhythm.   Pulmonary/Chest: Effort normal and breath sounds normal. No stridor. No respiratory distress. She has no wheezes. She has no rales.  Abdominal: She exhibits no distension.  Musculoskeletal: She exhibits no edema.  Neurological: She is alert and oriented to person, place, and time. No cranial nerve deficit.  Skin: Skin is warm and dry.  Psychiatric: She has a normal mood and affect.    ED Course  Procedures (including critical care time) Labs Review Labs Reviewed  CBC WITH DIFFERENTIAL  COMPREHENSIVE METABOLIC PANEL  LIPASE, BLOOD  TROPONIN I  URINALYSIS, ROUTINE W REFLEX MICROSCOPIC   Imaging Review No results found.  EKG Interpretation    Date/Time:  Tuesday January 03 2013 16:08:56 EST Ventricular Rate:  87 PR Interval:  145 QRS Duration: 72 QT Interval:  363 QTC Calculation: 437 R Axis:   18 Text Interpretation:  Sinus rhythm Atrial premature complex RSR' in V1 or V2, right VCD or RVH Sinus rhythm Artifact RSR' or QR pattern in V1 suggests right ventricular conduction delay less  artefact than on earlier ECG from today. Abnormal ekg Confirmed by Gerhard Munch  MD [phone]) on 01/03/2013 4:51:40 PM           Pulse oximetry 97% room air normal  After the initial evaluation I reviewed the patient's chart.  7:07 PM Patient in no distress.  She and her family member aware of all results. She has next day f/u.   MDM   1. Dyspnea   2. Elevated lipase    patient presents with concern of intermittent ongoing dyspnea.  Patient actually describes a sensation of difficulty getting a full breath secondary to fullness about her epigastrum.  Patient has no risk factors for PE, had CT angiography performed within the past week, and is not hypoxic, tachycardic or tachypneic. Patient's evaluation is most notable for demonstration of elevated lipase.  Patient does not drink alcohol, has no abdominal discomfort, no overt stigmata for malignancy.  Patient doesn't next followup, and she will discuss this lab abnormality with her physician.  On discharge she was in stable condition, with no evidence of distress.     Gerhard Munch, MD 01/03/13 [phone]

## 2013-01-03 NOTE — ED Notes (Signed)
Pt states she has had sob since this am. Pt speaking in complete sentences with no difficulty. Pt ambulated from wheelchair to bed with no difficulty. Pt states she was here on December 24 for similar issue. Pt states she has had cold like symptoms recently. Denies dizziness/lightheadedness.

## 2014-03-02 DIAGNOSIS — I4891 Unspecified atrial fibrillation: Secondary | ICD-10-CM | POA: Insufficient documentation

## 2014-04-13 DIAGNOSIS — H26493 Other secondary cataract, bilateral: Secondary | ICD-10-CM | POA: Diagnosis not present

## 2014-06-29 ENCOUNTER — Encounter (HOSPITAL_COMMUNITY): Payer: Self-pay | Admitting: Emergency Medicine

## 2014-06-29 ENCOUNTER — Emergency Department (HOSPITAL_COMMUNITY): Payer: Medicare Other

## 2014-06-29 ENCOUNTER — Observation Stay (HOSPITAL_COMMUNITY)
Admission: EM | Admit: 2014-06-29 | Discharge: 2014-07-02 | Disposition: A | Discharge status: Skilled Nursing Facility | Payer: Medicare Other | Attending: Internal Medicine | Admitting: Internal Medicine

## 2014-06-29 DIAGNOSIS — S32591A Other specified fracture of right pubis, initial encounter for closed fracture: Secondary | ICD-10-CM | POA: Diagnosis present

## 2014-06-29 DIAGNOSIS — M25552 Pain in left hip: Secondary | ICD-10-CM

## 2014-06-29 DIAGNOSIS — Z886 Allergy status to analgesic agent status: Secondary | ICD-10-CM | POA: Diagnosis not present

## 2014-06-29 DIAGNOSIS — S32609A Unspecified fracture of unspecified ischium, initial encounter for closed fracture: Secondary | ICD-10-CM | POA: Insufficient documentation

## 2014-06-29 DIAGNOSIS — Z87891 Personal history of nicotine dependence: Secondary | ICD-10-CM | POA: Insufficient documentation

## 2014-06-29 DIAGNOSIS — S32592A Other specified fracture of left pubis, initial encounter for closed fracture: Secondary | ICD-10-CM | POA: Diagnosis not present

## 2014-06-29 DIAGNOSIS — Y998 Other external cause status: Secondary | ICD-10-CM | POA: Insufficient documentation

## 2014-06-29 DIAGNOSIS — L8990 Pressure ulcer of unspecified site, unspecified stage: Secondary | ICD-10-CM | POA: Diagnosis not present

## 2014-06-29 DIAGNOSIS — Y92010 Kitchen of single-family (private) house as the place of occurrence of the external cause: Secondary | ICD-10-CM | POA: Diagnosis not present

## 2014-06-29 DIAGNOSIS — Z880 Allergy status to penicillin: Secondary | ICD-10-CM | POA: Insufficient documentation

## 2014-06-29 DIAGNOSIS — R2689 Other abnormalities of gait and mobility: Secondary | ICD-10-CM | POA: Diagnosis not present

## 2014-06-29 DIAGNOSIS — R251 Tremor, unspecified: Secondary | ICD-10-CM | POA: Insufficient documentation

## 2014-06-29 DIAGNOSIS — W010XXA Fall on same level from slipping, tripping and stumbling without subsequent striking against object, initial encounter: Secondary | ICD-10-CM | POA: Diagnosis not present

## 2014-06-29 DIAGNOSIS — S32602A Unspecified fracture of left ischium, initial encounter for closed fracture: Secondary | ICD-10-CM

## 2014-06-29 DIAGNOSIS — D696 Thrombocytopenia, unspecified: Secondary | ICD-10-CM | POA: Diagnosis not present

## 2014-06-29 DIAGNOSIS — S32502A Unspecified fracture of left pubis, initial encounter for closed fracture: Secondary | ICD-10-CM | POA: Diagnosis not present

## 2014-06-29 DIAGNOSIS — S329XXA Fracture of unspecified parts of lumbosacral spine and pelvis, initial encounter for closed fracture: Secondary | ICD-10-CM | POA: Diagnosis present

## 2014-06-29 DIAGNOSIS — Y93E9 Activity, other interior property and clothing maintenance: Secondary | ICD-10-CM | POA: Diagnosis not present

## 2014-06-29 DIAGNOSIS — R52 Pain, unspecified: Secondary | ICD-10-CM | POA: Diagnosis not present

## 2014-06-29 DIAGNOSIS — Z888 Allergy status to other drugs, medicaments and biological substances status: Secondary | ICD-10-CM | POA: Insufficient documentation

## 2014-06-29 DIAGNOSIS — S32512A Fracture of superior rim of left pubis, initial encounter for closed fracture: Secondary | ICD-10-CM | POA: Diagnosis not present

## 2014-06-29 HISTORY — DX: Other specified fracture of right pubis, initial encounter for closed fracture: S32.591A

## 2014-06-29 HISTORY — DX: Other specified fracture of left pubis, initial encounter for closed fracture: S32.592A

## 2014-06-29 LAB — BASIC METABOLIC PANEL
Anion gap: 8 (ref 5–15)
BUN: 16 mg/dL (ref 6–20)
CO2: 24 mmol/L (ref 22–32)
Calcium: 9.2 mg/dL (ref 8.9–10.3)
Chloride: 107 mmol/L (ref 101–111)
Creatinine, Ser: 0.91 mg/dL (ref 0.44–1.00)
GFR calc Af Amer: >60 mL/min (ref >60)
GFR calc non Af Amer: 58 mL/min — ABNORMAL LOW (ref >60)
Glucose, Bld: 118 mg/dL — ABNORMAL HIGH (ref 65–99)
POTASSIUM: 3.8 mmol/L (ref 3.5–5.1)
SODIUM: 139 mmol/L (ref 135–145)

## 2014-06-29 LAB — CBC
HCT: 39.6 % (ref 36.0–46.0)
HEMOGLOBIN: 13.2 g/dL (ref 12.0–15.0)
MCH: 31.4 pg (ref 26.0–34.0)
MCHC: 33.3 g/dL (ref 30.0–36.0)
MCV: 94.1 fL (ref 78.0–100.0)
Platelets: 135 10*3/uL — ABNORMAL LOW (ref 150–400)
RBC: 4.21 MIL/uL (ref 3.87–5.11)
RDW: 13.9 % (ref 11.5–15.5)
WBC: 12.2 10*3/uL — ABNORMAL HIGH (ref 4.0–10.5)

## 2014-06-29 LAB — PROTIME-INR
INR: 1.11 (ref 0.00–1.49)
Prothrombin Time: 14.5 seconds (ref 11.6–15.2)

## 2014-06-29 MED ORDER — SODIUM CHLORIDE 0.9 % IV BOLUS (SEPSIS)
1000.0000 mL | Freq: Once | INTRAVENOUS | Status: AC
  Administered 2014-06-29: 1000 mL via INTRAVENOUS

## 2014-06-29 MED ORDER — FENTANYL CITRATE (PF) 100 MCG/2ML IJ SOLN
50.0000 ug | Freq: Once | INTRAMUSCULAR | Status: AC
Start: 2014-06-29 — End: 2014-06-29
  Administered 2014-06-29: 50 ug via INTRAVENOUS
  Filled 2014-06-29: qty 2

## 2014-06-29 MED ORDER — FENTANYL CITRATE (PF) 100 MCG/2ML IJ SOLN
50.0000 ug | Freq: Once | INTRAMUSCULAR | Status: AC
  Administered 2014-06-29: 50 ug via INTRAVENOUS
  Filled 2014-06-29: qty 2

## 2014-06-29 NOTE — ED Notes (Signed)
Brought in by EMS from home with c/o left hip pain after her fall tonight.  Pt reports that she was trying to kill a bug on the floor when she lost her balance and fell on her left side.  Pt has had immediate pain to left hip after her fall.  Pt denies hitting head during her fall.  Pt was given Fentanyl 50 mcg IV and pelvis immobilized en route to ED.

## 2014-06-29 NOTE — ED Notes (Signed)
Bed: WA06 Expected date:  Expected time:  Means of arrival:  Comments: Pt in room, waiting on ride 

## 2014-06-30 DIAGNOSIS — S32602A Unspecified fracture of left ischium, initial encounter for closed fracture: Secondary | ICD-10-CM

## 2014-06-30 DIAGNOSIS — D696 Thrombocytopenia, unspecified: Secondary | ICD-10-CM

## 2014-06-30 DIAGNOSIS — S32502A Unspecified fracture of left pubis, initial encounter for closed fracture: Secondary | ICD-10-CM | POA: Diagnosis not present

## 2014-06-30 DIAGNOSIS — S329XXA Fracture of unspecified parts of lumbosacral spine and pelvis, initial encounter for closed fracture: Secondary | ICD-10-CM | POA: Diagnosis present

## 2014-06-30 DIAGNOSIS — S32592A Other specified fracture of left pubis, initial encounter for closed fracture: Secondary | ICD-10-CM | POA: Diagnosis not present

## 2014-06-30 DIAGNOSIS — S32512A Fracture of superior rim of left pubis, initial encounter for closed fracture: Secondary | ICD-10-CM | POA: Diagnosis not present

## 2014-06-30 DIAGNOSIS — M25552 Pain in left hip: Secondary | ICD-10-CM | POA: Diagnosis not present

## 2014-06-30 DIAGNOSIS — S32609A Unspecified fracture of unspecified ischium, initial encounter for closed fracture: Secondary | ICD-10-CM | POA: Insufficient documentation

## 2014-06-30 DIAGNOSIS — L8990 Pressure ulcer of unspecified site, unspecified stage: Secondary | ICD-10-CM | POA: Insufficient documentation

## 2014-06-30 DIAGNOSIS — S32501A Unspecified fracture of right pubis, initial encounter for closed fracture: Secondary | ICD-10-CM

## 2014-06-30 DIAGNOSIS — R251 Tremor, unspecified: Secondary | ICD-10-CM

## 2014-06-30 HISTORY — DX: Tremor, unspecified: R25.1

## 2014-06-30 HISTORY — DX: Pressure ulcer of unspecified site, unspecified stage: L89.90

## 2014-06-30 HISTORY — DX: Thrombocytopenia, unspecified: D69.6

## 2014-06-30 HISTORY — DX: Fracture of unspecified parts of lumbosacral spine and pelvis, initial encounter for closed fracture: S32.9XXA

## 2014-06-30 LAB — BASIC METABOLIC PANEL
Anion gap: 6 (ref 5–15)
BUN: 14 mg/dL (ref 6–20)
CO2: 27 mmol/L (ref 22–32)
Calcium: 8.5 mg/dL — ABNORMAL LOW (ref 8.9–10.3)
Chloride: 103 mmol/L (ref 101–111)
Creatinine, Ser: 0.89 mg/dL (ref 0.44–1.00)
GFR calc non Af Amer: 60 mL/min — ABNORMAL LOW (ref >60)
Glucose, Bld: 142 mg/dL — ABNORMAL HIGH (ref 65–99)
Potassium: 3.5 mmol/L (ref 3.5–5.1)
Sodium: 136 mmol/L (ref 135–145)

## 2014-06-30 LAB — CBC
HCT: 36.6 % (ref 36.0–46.0)
Hemoglobin: 12.2 g/dL (ref 12.0–15.0)
MCH: 31.3 pg (ref 26.0–34.0)
MCHC: 33.3 g/dL (ref 30.0–36.0)
MCV: 93.8 fL (ref 78.0–100.0)
PLATELETS: 131 10*3/uL — AB (ref 150–400)
RBC: 3.9 MIL/uL (ref 3.87–5.11)
RDW: 13.8 % (ref 11.5–15.5)
WBC: 7.4 10*3/uL (ref 4.0–10.5)

## 2014-06-30 MED ORDER — HYDROMORPHONE HCL 1 MG/ML IJ SOLN
0.5000 mg | INTRAMUSCULAR | Status: DC | PRN
  Administered 2014-06-30 – 2014-07-01 (×3): 0.5 mg via INTRAVENOUS
  Filled 2014-06-30 (×3): qty 1

## 2014-06-30 MED ORDER — ENOXAPARIN SODIUM 40 MG/0.4ML ~~LOC~~ SOLN
40.0000 mg | SUBCUTANEOUS | Status: DC
  Administered 2014-06-30: 40 mg via SUBCUTANEOUS
  Filled 2014-06-30 (×3): qty 0.4

## 2014-06-30 MED ORDER — ACETAMINOPHEN 325 MG PO TABS
650.0000 mg | ORAL_TABLET | Freq: Four times a day (QID) | ORAL | Status: DC | PRN
  Administered 2014-06-30: 650 mg via ORAL
  Filled 2014-06-30: qty 2

## 2014-06-30 MED ORDER — SODIUM CHLORIDE 0.9 % IV SOLN
INTRAVENOUS | Status: DC
  Administered 2014-06-30: 22:00:00 via INTRAVENOUS

## 2014-06-30 MED ORDER — ONDANSETRON HCL 4 MG/2ML IJ SOLN: 4.0000 mg | Freq: Four times a day (QID) | INTRAMUSCULAR | Status: DC | PRN

## 2014-06-30 MED ORDER — OXYCODONE HCL 5 MG PO TABS
5.0000 mg | ORAL_TABLET | ORAL | Status: DC | PRN
  Administered 2014-07-01 – 2014-07-02 (×3): 5 mg via ORAL
  Filled 2014-06-30 (×3): qty 1

## 2014-06-30 MED ORDER — ACETAMINOPHEN 500 MG PO TABS
1000.0000 mg | ORAL_TABLET | Freq: Three times a day (TID) | ORAL | Status: DC
  Administered 2014-06-30 (×2): 1000 mg via ORAL
  Filled 2014-06-30 (×6): qty 2

## 2014-06-30 MED ORDER — ACETAMINOPHEN 650 MG RE SUPP: 650.0000 mg | Freq: Four times a day (QID) | RECTAL | Status: DC | PRN

## 2014-06-30 MED ORDER — ALUM & MAG HYDROXIDE-SIMETH 200-200-20 MG/5ML PO SUSP: 30.0000 mL | Freq: Four times a day (QID) | ORAL | Status: DC | PRN

## 2014-06-30 MED ORDER — ONDANSETRON HCL 4 MG PO TABS: 4.0000 mg | ORAL_TABLET | Freq: Four times a day (QID) | ORAL | Status: DC | PRN

## 2014-06-30 MED ORDER — SODIUM CHLORIDE 0.9 % IV SOLN
INTRAVENOUS | Status: DC
  Administered 2014-06-30: 03:00:00 via INTRAVENOUS

## 2014-06-30 MED ORDER — POLYETHYLENE GLYCOL 3350 17 G PO PACK
17.0000 g | PACK | Freq: Every day | ORAL | Status: DC
  Administered 2014-06-30: 17 g via ORAL
  Filled 2014-06-30 (×4): qty 1

## 2014-06-30 NOTE — Progress Notes (Deleted)
OT  Note  Patient Details Name: Miral Breheny MRN: [id number] DOB: [birth year]   Cancelled Treatment:    Noted pt from SNF returning to SNF. Will defer OT eval to SNF Marco Raper, [facility] 06/30/2014, 2:50 PM

## 2014-06-30 NOTE — Progress Notes (Signed)
Physical Therapy Treatment Patient Details Name: Yolanda Shaw MRN: [id number] DOB: 01/04/1932 Today's Date: 06/30/2014    History of Present Illness 80 yo female admitted with L sup/inf pubic rami fractures.     PT Comments    Progressing very slowly with mobility. Limited by pain. Continue to recommend SNF.   Follow Up Recommendations  SNF;Supervision/Assistance - 24 hour     Equipment Recommendations  Rolling walker with 5" wheels;Hospital bed;Wheelchair (measurements PT);Wheelchair cushion (measurements PT)    Recommendations for Other Services OT consult     Precautions / Restrictions Precautions Precautions: Fall Restrictions Weight Bearing Restrictions: No LLE Weight Bearing: Weight bearing as tolerated    Mobility  Bed Mobility Overal bed mobility: Needs Assistance Bed Mobility: Sit to Supine     Supine to sit: Mod assist;+2 for physical assistance;+2 for safety/equipment;HOB elevated Sit to supine: Mod assist;+2 for physical assistance   General bed mobility comments: Assist for trunk and bil LEs. Increased time. Utilized bedpad for scooting, positioning.   Transfers Overall transfer level: Needs assistance Equipment used: Rolling walker (2 wheeled) Transfers: Sit to/from Stand Sit to Stand: Mod assist;+2 physical assistance;+2 safety/equipment;From elevated surface Stand pivot transfers: Mod assist;+2 physical assistance;+2 safety/equipment       General transfer comment: Assist to rise stabilize, control descent. Multimodal cues for safety, technique hand placement. Pt was able to perform partial stand pivot this session. Remains limited by pain.   Ambulation/Gait             General Gait Details: 2-3 very small backwards steps towards the bed. Pt was unable to make it completely back to bed so bed moved closer to pt.    Stairs            Wheelchair Mobility    Modified Rankin (Stroke Patients Only)       Balance Overall balance  assessment: History of Falls;Needs assistance         Standing balance support: Bilateral upper extremity supported;During functional activity Standing balance-Leahy Scale: Poor Standing balance comment: needs RW                    Cognition Arousal/Alertness: Awake/alert Behavior During Therapy: WFL for tasks assessed/performed Overall Cognitive Status: Within Functional Limits for tasks assessed                      Exercises      General Comments        Pertinent Vitals/Pain Pain Assessment: Faces Faces Pain Scale: Hurts whole lot Pain Location: L LE with mobility Pain Descriptors / Indicators: Sharp;Sore;Aching Pain Intervention(s): Monitored during session;Repositioned    Home Living Family/patient expects to be discharged to:: Private residence Living Arrangements: Children   Type of Home: House Home Access: Stairs to enter   Home Layout: One level Home Equipment: None      Prior Function Level of Independence: Independent          PT Goals (current goals can now be found in the care plan section) Acute Rehab PT Goals Patient Stated Goal: less pain PT Goal Formulation: With patient Time For Goal Achievement: 07/14/14 Potential to Achieve Goals: Good Progress towards PT goals: Progressing toward goals (very slowly)    Frequency  Min 3X/week    PT Plan Current plan remains appropriate    Co-evaluation             End of Session Equipment Utilized During Treatment: Gait belt Activity Tolerance: Patient limited by  pain;Patient limited by fatigue Patient left: in bed;with call bell/phone within reach;with family/visitor present     Time: 1610-9604 PT Time Calculation (min) (ACUTE ONLY): 15 min  Charges:  $Therapeutic Activity: 8-22 mins                    G Codes:      Rebeca Alert, MPT Pager: [phone]

## 2014-06-30 NOTE — Evaluation (Signed)
Physical Therapy Evaluation Patient Details Name: Sanaz Scarlett MRN: [id number] DOB: [birth year] Today's Date: 06/30/2014   History of Present Illness  81 yo female admitted with L sup/inf pubic rami fractures.   Clinical Impression  On eval, pt required Mod assist +2 for mobility-able to stand but not able to pivot or take steps due to pain. Recommend ST rehab at SNF if pt/family will agree.     Follow Up Recommendations SNF;Supervision/Assistance - 24 hour    Equipment Recommendations  Rolling walker with 5" wheels;Hospital bed;Wheelchair (measurements PT);Wheelchair cushion (measurements PT)    Recommendations for Other Services OT consult     Precautions / Restrictions Precautions Precautions: Fall Restrictions Weight Bearing Restrictions: No LLE Weight Bearing: Weight bearing as tolerated      Mobility  Bed Mobility Overal bed mobility: Needs Assistance Bed Mobility: Supine to Sit     Supine to sit: Mod assist;+2 for physical assistance;+2 for safety/equipment;HOB elevated     General bed mobility comments: Assist for trunk and bil LEs. Increased time. Utilized bedpad for scooting, positioning.   Transfers Overall transfer level: Needs assistance Equipment used: Rolling walker (2 wheeled) Transfers: Sit to/from [employer] Sit to Stand: Mod assist;+2 physical assistance;+2 safety/equipment;From elevated surface Stand pivot transfers: Mod assist;+2 physical assistance;+2 safety/equipment       General transfer comment: Assist to rise stabilize, control descent. Multimodal cues for safety, technique hand placement. Attempted stand pivot from bed to recliner but pt was unable to complete task due to pain. Bed moved and recliner brought up behind pt prior to sitting.   Ambulation/Gait             General Gait Details: NT-pt unable  Stairs            Wheelchair Mobility    Modified Rankin (Stroke Patients Only)       Balance  Overall balance assessment: History of Falls;Needs assistance         Standing balance support: Bilateral upper extremity supported;During functional activity Standing balance-Leahy Scale: Poor                               Pertinent Vitals/Pain Pain Assessment: Faces Faces Pain Scale: Hurts whole lot Pain Location: L LE with mobility Pain Descriptors / Indicators: Sharp;Sore;Aching Pain Intervention(s): Monitored during session;Repositioned    Home Living Family/patient expects to be discharged to:: Private residence Living Arrangements: Children   Type of Home: House Home Access: Stairs to enter   Secretary/administrator of Steps: 1 Home Layout: One level Home Equipment: None      Prior Function Level of Independence: Independent               Hand Dominance        Extremity/Trunk Assessment   Upper Extremity Assessment: Generalized weakness           Lower Extremity Assessment: Generalized weakness      Cervical / Trunk Assessment: Kyphotic  Communication   Communication: No difficulties  Cognition Arousal/Alertness: Awake/alert Behavior During Therapy: WFL for tasks assessed/performed Overall Cognitive Status: Within Functional Limits for tasks assessed                      General Comments      Exercises        Assessment/Plan    PT Assessment Patient needs continued PT services  PT Diagnosis Difficulty walking;Abnormality of gait;Generalized weakness;Acute pain  PT Problem List Decreased strength;Decreased range of motion;Decreased activity tolerance;Decreased balance;Decreased mobility;Decreased knowledge of use of DME;Pain  PT Treatment Interventions DME instruction;Gait training;Functional mobility training;Therapeutic activities;Therapeutic exercise;Patient/family education;Balance training   PT Goals (Current goals can be found in the Care Plan section) Acute Rehab PT Goals Patient Stated Goal: less pain PT  Goal Formulation: With patient Time For Goal Achievement: 07/14/14 Potential to Achieve Goals: Good    Frequency Min 3X/week   Barriers to discharge        Co-evaluation               End of Session Equipment Utilized During Treatment: Gait belt Activity Tolerance: Patient limited by pain Patient left: in chair;with call bell/phone within reach           Time: 1032-1053 PT Time Calculation (min) (ACUTE ONLY): 21 min   Charges:   PT Evaluation $Initial PT Evaluation Tier I: 1 Procedure     PT G Codes:        Rebeca Alert, MPT Pager: [phone]

## 2014-06-30 NOTE — Plan of Care (Signed)
Problem: Phase II Progression Outcomes Goal: Progress activity as tolerated unless otherwise ordered Outcome: Progressing Seen by PT.  Pt up in chair for about 2 hours.  Required mod assist x 2, per PT report.  Attempting to reposition pt in bed and chair, but pt having difficulty finding comfortable position outside of supine.

## 2014-06-30 NOTE — ED Provider Notes (Signed)
CSN: [id number]     Arrival date & time 06/29/14  2032 History   First MD Initiated Contact with Patient 06/29/14 2053     Chief Complaint  Patient presents with  . Fall  . Hip Pain     (Consider location/radiation/quality/duration/timing/severity/associated sxs/prior Treatment) HPI Comments: Fall on left hip all trying to kill a bug. Landed on linoleum. No head injury or loss of consciousness  Patient is a 81 y.o. female presenting with fall. The history is provided by the patient.  Fall This is a new problem. The current episode started 3 to 5 hours ago. The problem occurs constantly. The problem has not changed since onset.Pertinent negatives include no chest pain, no abdominal pain and no shortness of breath. Nothing aggravates the symptoms. Nothing relieves the symptoms. She has tried nothing for the symptoms.    History reviewed. No pertinent past medical history. Past Surgical History  Procedure Laterality Date  . Wrist surgery    . Tonsillectomy    . Adenoidectomy    . Shoulder surgery      right   History reviewed. No pertinent family history. History  Substance Use Topics  . Smoking status: Former Games developer  . Smokeless tobacco: Not on file  . Alcohol Use: Yes     Comment: occassional wine   OB History    No data available     Review of Systems  Constitutional: Negative for fever and chills.  Respiratory: Negative for shortness of breath.   Cardiovascular: Negative for chest pain.  Gastrointestinal: Negative for abdominal pain.  All other systems reviewed and are negative.     Allergies  Ether; Penicillins; and Amitriptyline  Home Medications   Prior to Admission medications   Medication Sig Start Date End Date Taking? Authorizing Provider  Ascorbic Acid (VITAMIN C PO) Take 1 tablet by mouth daily.   Yes Historical Provider, MD  Cholecalciferol (VITAMIN D PO) Take 1 tablet by mouth daily.   Yes Historical Provider, MD  Multiple Vitamins-Minerals  (MULTIVITAMIN ADULT PO) Take 1 tablet by mouth daily.   Yes Historical Provider, MD  OVER THE COUNTER MEDICATION Take 1 tablet by mouth 3 (three) times daily. Cardio plus   Yes Historical Provider, MD  cephALEXin (KEFLEX) 500 MG capsule Take 1 capsule (500 mg total) by mouth 4 (four) times daily. Patient not taking: Reported on 06/29/2014 12/28/12   Glynn Octave, MD  OVER THE COUNTER MEDICATION Take 1 tablet by mouth 3 (three) times daily. paramin    Historical Provider, MD   BP 138/80 mmHg  Pulse 88  Temp(Src) 98.3 F (36.8 C) (Oral)  Resp 18  SpO2 97% Physical Exam  Constitutional: She is oriented to person, place, and time. She appears well-developed and well-nourished. No distress.  HENT:  Head: Normocephalic and atraumatic.  Mouth/Throat: Oropharynx is clear and moist.  Eyes: EOM are normal. Pupils are equal, round, and reactive to light.  Neck: Normal range of motion. Neck supple.  Cardiovascular: Normal rate and regular rhythm.  Exam reveals no friction rub.   No murmur heard. Pulmonary/Chest: Effort normal and breath sounds normal. No respiratory distress. She has no wheezes. She has no rales.  Abdominal: Soft. She exhibits no distension. There is no tenderness. There is no rebound.  Musculoskeletal: She exhibits no edema.       Left hip: She exhibits decreased range of motion (Due to pain) and bony tenderness (lateral). She exhibits no deformity.  Neurological: She is alert and oriented to person, place,  and time.  Skin: She is not diaphoretic.  Nursing note and vitals reviewed.   ED Course  Procedures (including critical care time) Labs Review Labs Reviewed  CBC - Abnormal; Notable for the following:    WBC 12.2 (*)    Platelets 135 (*)    All other components within normal limits  BASIC METABOLIC PANEL - Abnormal; Notable for the following:    Glucose, Bld 118 (*)    GFR calc non Af Amer 58 (*)    All other components within normal limits  PROTIME-INR     Imaging Review Dg Hip Unilat With Pelvis 2-3 Views Left  06/29/2014   CLINICAL DATA:  Left hip pain after falling night. Lost her balance and fell on the left side. Immediate pain in the hip.  EXAM: LEFT HIP (WITH PELVIS) 2-3 VIEWS  COMPARISON:  None.  FINDINGS: There are acute fractures of the left superior and inferior pubic rami. The femurs appear intact. No evidence for dislocation. Bones appear osteopenic.  IMPRESSION: Fractures of the left superior and inferior pubic rami.   Electronically Signed   By: Norva Pavlov M.D.   On: 06/29/2014 22:45     EKG Interpretation   Date/Time:  Friday June 29 2014 21:44:06 EDT Ventricular Rate:  81 PR Interval:  177 QRS Duration: 72 QT Interval:  382 QTC Calculation: 443 R Axis:   44 Text Interpretation:  Ventricular-paced complexes No further rhythm  analysis attempted due to paced rhythm Minimal ST elevation, inferior  leads No significant change since last tracing Confirmed by Gwendolyn Grant  MD,  Jolette Lana ([id number]) on 06/30/2014 12:01:56 AM      MDM   Final diagnoses:  Left hip pain  Ischium fracture, left, closed, initial encounter  Fracture of left pubis, closed, initial encounter    81 year old female here after a fall at home. Mechanical, slipped in the kitchen while trying to sort of bug. Landed on her left hip. She's been unable to bear weight ever since. Vitals are stable here. Complaining of left hip pain. Here left leg with intact distal pulses. Left leg is not externally rotated and is not shortened. She has no pain with internal and neck short rotation of the hip. She does have pain with lateral compression of head. X-ray shows inferior and superior pubic rami fractures. I spoke with Dr. Magnus Ivan with orthopedics who recommended weightbearing as tolerated. Patient admitted by Dr. Lovell Sheehan for further management.  I have reviewed all labs and imaging and considered them in my medical decision making.   Elwin Mocha, MD 06/30/14  [phone]

## 2014-06-30 NOTE — Care Management Note (Addendum)
Case Management Note  Patient Details  Name: Deavian Sawada MRN: [id number] Date of Birth: May 21, 1931  Subjective/Objective:     Bilateral pelvic fractures s/p fall                Action/Plan: Expected Discharge Date:                  Expected Discharge Plan:  Home w Home Health Services  In-House Referral:     Discharge planning Services  CM Consult  Post Acute Care Choice:    Choice offered to:  Adult Children  DME Arranged:    DME Agency:     HH Arranged:    HH Agency:     Status of Service:  In process, will continue to follow  Medicare Important Message Given:    Date Medicare IM Given:    Medicare IM give by:    Date Additional Medicare IM Given:    Additional Medicare Important Message give by:     If discussed at Long Length of Stay Meetings, dates discussed:    Additional Comments: Discuss patient's discharge preferences per patient and son patient wants to go home at time of discharge.  Son and sister in room at time of discussion.  Son confirmed he does live with patient. Choice list given to son.  Will follow up closer to d/c for final arrangements.   Barnett Applebaum, RN 06/30/2014, 3:49 PM

## 2014-06-30 NOTE — Consult Note (Signed)
Reason for Consult:  Left-sided pelvic fractures Referring Physician: EDP  Yolanda Shaw is an 81 y.o. female.  HPI:   81 yo female who reports a history of increasing weakness and weight loss.  She sustained an accidental mechanical fall last evening in her home.  Denies any syncopal episode.  Also denies any chest pain, SOB, GI/GU symptoms other than weight loss and problems with writing and balance in general.  She does report left-sided pelvic pain after this fall and x-rays do show a non-displaced pubic rami fracture.  Her pain is 7 out of ten with weight bearing.  She is currently seeing her PCP in [place] who is working her balance/coordination and weakness issues.  History reviewed. No pertinent past medical history.  Past Surgical History  Procedure Laterality Date  . Wrist surgery    . Tonsillectomy    . Adenoidectomy    . Shoulder surgery      right    History reviewed. No pertinent family history.  Social History:  reports that she has quit smoking. She does not have any smokeless tobacco history on file. She reports that she drinks alcohol. She reports that she does not use illicit drugs.  Allergies:  Allergies  Allergen Reactions  . Ether Other (See Comments)    Hard to wake up after procedure  . Penicillins Other (See Comments)    welps  . Amitriptyline Rash    Medications: I have reviewed the patient's current medications.  Results for orders placed or performed during the hospital encounter of 06/29/14 (from the past 48 hour(s))  CBC     Status: Abnormal   Collection Time: 06/29/14  9:36 PM  Result Value Ref Range   WBC 12.2 (H) 4.0 - 10.5 K/uL   RBC 4.21 3.87 - 5.11 MIL/uL   Hemoglobin 13.2 12.0 - 15.0 g/dL   HCT 39.6 36.0 - 46.0 %   MCV 94.1 78.0 - 100.0 fL   MCH 31.4 26.0 - 34.0 pg   MCHC 33.3 30.0 - 36.0 g/dL   RDW 13.9 11.5 - 15.5 %   Platelets 135 (L) 150 - 400 K/uL  Basic metabolic panel     Status: Abnormal   Collection Time: 06/29/14  9:36  PM  Result Value Ref Range   Sodium 139 135 - 145 mmol/L   Potassium 3.8 3.5 - 5.1 mmol/L   Chloride 107 101 - 111 mmol/L   CO2 24 22 - 32 mmol/L   Glucose, Bld 118 (H) 65 - 99 mg/dL   BUN 16 6 - 20 mg/dL   Creatinine, Ser 0.91 0.44 - 1.00 mg/dL   Calcium 9.2 8.9 - 10.3 mg/dL   GFR calc non Af Amer 58 (L) >60 mL/min   GFR calc Af Amer >60 >60 mL/min    Comment: (NOTE) The eGFR has been calculated using the CKD EPI equation. This calculation has not been validated in all clinical situations. eGFR's persistently <60 mL/min signify possible Chronic Kidney Disease.    Anion gap 8 5 - 15  Protime-INR     Status: None   Collection Time: 06/29/14  9:36 PM  Result Value Ref Range   Prothrombin Time 14.5 11.6 - 15.2 seconds   INR 1.11 0.00 - 5.05  Basic metabolic panel     Status: Abnormal   Collection Time: 06/30/14  5:06 AM  Result Value Ref Range   Sodium 136 135 - 145 mmol/L   Potassium 3.5 3.5 - 5.1 mmol/L   Chloride 103  101 - 111 mmol/L   CO2 27 22 - 32 mmol/L   Glucose, Bld 142 (H) 65 - 99 mg/dL   BUN 14 6 - 20 mg/dL   Creatinine, Ser 0.89 0.44 - 1.00 mg/dL   Calcium 8.5 (L) 8.9 - 10.3 mg/dL   GFR calc non Af Amer 60 (L) >60 mL/min   GFR calc Af Amer >60 >60 mL/min    Comment: (NOTE) The eGFR has been calculated using the CKD EPI equation. This calculation has not been validated in all clinical situations. eGFR's persistently <60 mL/min signify possible Chronic Kidney Disease.    Anion gap 6 5 - 15  CBC     Status: Abnormal   Collection Time: 06/30/14  5:06 AM  Result Value Ref Range   WBC 7.4 4.0 - 10.5 K/uL   RBC 3.90 3.87 - 5.11 MIL/uL   Hemoglobin 12.2 12.0 - 15.0 g/dL   HCT 36.6 36.0 - 46.0 %   MCV 93.8 78.0 - 100.0 fL   MCH 31.3 26.0 - 34.0 pg   MCHC 33.3 30.0 - 36.0 g/dL   RDW 13.8 11.5 - 15.5 %   Platelets 131 (L) 150 - 400 K/uL    Dg Hip Unilat With Pelvis 2-3 Views Left  06/29/2014   CLINICAL DATA:  Left hip pain after falling night. Lost her balance  and fell on the left side. Immediate pain in the hip.  EXAM: LEFT HIP (WITH PELVIS) 2-3 VIEWS  COMPARISON:  None.  FINDINGS: There are acute fractures of the left superior and inferior pubic rami. The femurs appear intact. No evidence for dislocation. Bones appear osteopenic.  IMPRESSION: Fractures of the left superior and inferior pubic rami.   Electronically Signed   By: Nolon Nations M.D.   On: 06/29/2014 22:45    Review of Systems  Constitutional: Positive for weight loss and malaise/fatigue.  Musculoskeletal: Positive for myalgias.  Neurological: Positive for weakness.   Blood pressure 127/64, pulse 67, temperature 98.3 F (36.8 C), temperature source Oral, resp. rate 15, height '5\' 7"'  (1.702 m), weight 52.209 kg (115 lb 1.6 oz), SpO2 97 %. Physical Exam  Constitutional: She is oriented to person, place, and time. She appears well-developed.  HENT:  Head: Normocephalic and atraumatic.  Eyes: Pupils are equal, round, and reactive to light.  Neck: Normal range of motion. Neck supple.  Cardiovascular: Normal rate.   Respiratory: Effort normal and breath sounds normal.  GI: Soft. Bowel sounds are normal.  Musculoskeletal:       Left hip: She exhibits decreased strength and bony tenderness.  Neurological: She is oriented to person, place, and time.  Skin: Skin is warm and dry.  Psychiatric: She has a normal mood and affect.   She is able to push down with her left foot and I can rotate her left hip  Assessment/Plan: Non-displaced left-side pubic rami fracture in an elderly patient with a history of increasing weakness and weight-loss 1)  Will need PT and OT for mobility, ADL's, WBAT left hip, balance and coordination 2)  Will follow.  I have spoken to her and her family in length at the bedside.  Ariyanna Oien Y 06/30/2014, 10:29 AM

## 2014-06-30 NOTE — Progress Notes (Signed)
PATIENT DETAILS Name: Yolanda Shaw Age: 81 y.o. Sex: female Date of Birth: 1931-06-28 Admit Date: 06/29/2014 Admitting Physician Ron Parker, MD POE:UMPNTIR, Oleh Genin, MD  Subjective: Continues to have pain in her pelvic area  Assessment/Plan: Principal Problem: Bilateral pubic rami fractures: Secondary to a mechanical fall. Seen by orthopedics, recommendations are weightbearing as tolerated. Seen by physical therapy-recommendations are for SNF. Have consulted social work. Continue pain control-start scheduled Tylenol, attempt to minimize narcotics.  Active Problems: Tremor: Being worked up as an outpatient at [facility]. Patient has a scheduled appointment with neurology in the next few weeks. Defer further workup to the outpatient setting.  Disposition: Remain inpatient-SNF vs home health PT on discharge  Antimicrobial agents  See below  Anti-infectives    None      DVT Prophylaxis: Prophylactic Lovenox  Code Status: Full code   Family Communication Son at bedside  Procedures: None  CONSULTS:  orthopedic surgery  Time spent 30 minutes-Greater than 50% of this time was spent in counseling, explanation of diagnosis, planning of further management, and coordination of care.  MEDICATIONS: Scheduled Meds:  Continuous Infusions: . sodium chloride 50 mL/hr at 06/30/14 0303   PRN Meds:.acetaminophen **OR** acetaminophen, alum & mag hydroxide-simeth, HYDROmorphone (DILAUDID) injection, ondansetron **OR** ondansetron (ZOFRAN) IV, oxyCODONE    PHYSICAL EXAM: Vital signs in last 24 hours: Filed Vitals:   06/30/14 0015 06/30/14 0048 06/30/14 0526 06/30/14 1413  BP:  145/85 127/64 140/75  Pulse:  83 67 75  Temp:  98 F (36.7 C) 98.3 F (36.8 C) 98.2 F (36.8 C)  TempSrc:  Oral Oral Oral  Resp: 13 16 15 16   Height:  5\' 7"  (1.702 m)    Weight:  52.209 kg (115 lb 1.6 oz)    SpO2:  96% 97% 97%    Weight change:  Filed Weights   06/30/14  0048  Weight: 52.209 kg (115 lb 1.6 oz)   Body mass index is 18.02 kg/(m^2).   Gen Exam: Awake and alert with clear speech.   Neck: Supple, No JVD.   Chest: B/L Clear.   CVS: S1 S2 Regular, no murmurs.  Abdomen: soft, BS +, non tender, non distended.  Extremities: no edema, lower extremities warm to touch. Neurologic: Non Focal.   Skin: No Rash.   Wounds: N/A.   Intake/Output from previous day:  Intake/Output Summary (Last 24 hours) at 06/30/14 1614 Last data filed at 06/30/14 0600  Gross per 24 hour  Intake 1147.5 ml  Output    850 ml  Net  297.5 ml     LAB RESULTS: CBC  Recent Labs Lab 06/29/14 2136 06/30/14 0506  WBC 12.2* 7.4  HGB 13.2 12.2  HCT 39.6 36.6  PLT 135* 131*  MCV 94.1 93.8  MCH 31.4 31.3  MCHC 33.3 33.3  RDW 13.9 13.8    Chemistries   Recent Labs Lab 06/29/14 2136 06/30/14 0506  NA 139 136  K 3.8 3.5  CL 107 103  CO2 24 27  GLUCOSE 118* 142*  BUN 16 14  CREATININE 0.91 0.89  CALCIUM 9.2 8.5*    CBG: No results for input(s): GLUCAP in the last 168 hours.  GFR Estimated Creatinine Clearance: 41.5 mL/min (by C-G formula based on Cr of 0.89).  Coagulation profile  Recent Labs Lab 06/29/14 2136  INR 1.11    Cardiac Enzymes No results for input(s): CKMB, TROPONINI, MYOGLOBIN in the last 168  hours.  Invalid input(s): CK  Invalid input(s): POCBNP No results for input(s): DDIMER in the last 72 hours. No results for input(s): HGBA1C in the last 72 hours. No results for input(s): CHOL, HDL, LDLCALC, TRIG, CHOLHDL, LDLDIRECT in the last 72 hours. No results for input(s): TSH, T4TOTAL, T3FREE, THYROIDAB in the last 72 hours.  Invalid input(s): FREET3 No results for input(s): VITAMINB12, FOLATE, FERRITIN, TIBC, IRON, RETICCTPCT in the last 72 hours. No results for input(s): LIPASE, AMYLASE in the last 72 hours.  Urine Studies No results for input(s): UHGB, CRYS in the last 72 hours.  Invalid input(s): UACOL, UAPR, USPG, UPH,  UTP, UGL, UKET, UBIL, UNIT, UROB, ULEU, UEPI, UWBC, URBC, UBAC, CAST, UCOM, BILUA  MICROBIOLOGY: No results found for this or any previous visit (from the past 240 hour(s)).  RADIOLOGY STUDIES/RESULTS: Dg Hip Unilat With Pelvis 2-3 Views Left  06/29/2014   CLINICAL DATA:  Left hip pain after falling night. Lost her balance and fell on the left side. Immediate pain in the hip.  EXAM: LEFT HIP (WITH PELVIS) 2-3 VIEWS  COMPARISON:  None.  FINDINGS: There are acute fractures of the left superior and inferior pubic rami. The femurs appear intact. No evidence for dislocation. Bones appear osteopenic.  IMPRESSION: Fractures of the left superior and inferior pubic rami.   Electronically Signed   By: Norva Pavlov M.D.   On: 06/29/2014 22:45    Jeoffrey Massed, MD  [facility] Pager:[phone]  If 7PM-7AM, please contact night-coverage [email or web address] Password [email or web address] 06/30/2014, 4:14 PM   LOS: 1 day

## 2014-06-30 NOTE — H&P (Signed)
[facility] Admission History and Physical       Yolanda Shaw ZOX:[id number] DOB: 01-01-1932 DOA: 06/29/2014  Referring physician:  PCP: Yolanda Churches, MD  Specialists:   Chief Complaint: Left Hip Pain after Fall  HPI: Yolanda Shaw is a 81 y.o. female with no previous medical problems who was going into her kitchen tonight and she bent over to swat an insect on the floor and fell onto her left hip and had increased pain.  She was brought to the ED and on imaging was found to have fractures of the left superior and Inferior pelvic rami and was referred for medical admission.       Review of Systems:  Constitutional: No Weight Loss, No Weight Gain, Night Sweats, Fevers, Chills, Dizziness, Light Headedness, Fatigue, or Generalized Weakness HEENT: No Headaches, Difficulty Swallowing,Tooth/Dental Problems,Sore Throat,  No Sneezing, Rhinitis, Ear Ache, Nasal Congestion, or Post Nasal Drip,  Cardio-vascular:  No Chest pain, Orthopnea, PND, Edema in Lower Extremities, Anasarca, Dizziness, Palpitations  Resp: No Dyspnea, No DOE, No Productive Cough, No Non-Productive Cough, No Hemoptysis, No Wheezing.    GI: No Heartburn, Indigestion, Abdominal Pain, Nausea, Vomiting, Diarrhea, Constipation, Hematemesis, Hematochezia, Melena, Change in Bowel Habits,  Loss of Appetite  GU: No Dysuria, No Change in Color of Urine, No Urgency or Urinary Frequency, No Flank pain.  Musculoskeletal: No Joint Pain or Swelling, No Decreased Range of Motion, No Back Pain.  Neurologic: No Syncope, No Seizures, +Tremors, Muscle Weakness, Paresthesia, Vision Disturbance or Loss, No Diplopia, No Vertigo, No Difficulty Walking,  Skin: No Rash or Lesions. Psych: No Change in Mood or Affect, No Depression or Anxiety, No Memory loss, No Confusion, or Hallucinations   History reviewed. No pertinent past medical history.   Past Surgical History  Procedure Laterality Date  . Wrist surgery    . Tonsillectomy    .  Adenoidectomy    . Shoulder surgery      right      Prior to Admission medications   Medication Sig Start Date End Date Taking? Authorizing Provider  Ascorbic Acid (VITAMIN C PO) Take 1 tablet by mouth daily.   Yes Historical Provider, MD  Cholecalciferol (VITAMIN D PO) Take 1 tablet by mouth daily.   Yes Historical Provider, MD  Multiple Vitamins-Minerals (MULTIVITAMIN ADULT PO) Take 1 tablet by mouth daily.   Yes Historical Provider, MD  OVER THE COUNTER MEDICATION Take 1 tablet by mouth 3 (three) times daily. Cardio plus   Yes Historical Provider, MD  cephALEXin (KEFLEX) 500 MG capsule Take 1 capsule (500 mg total) by mouth 4 (four) times daily. Patient not taking: Reported on 06/29/2014 12/28/12   Glynn Octave, MD  OVER THE COUNTER MEDICATION Take 1 tablet by mouth 3 (three) times daily. paramin    Historical Provider, MD     Allergies  Allergen Reactions  . Ether Other (See Comments)    Hard to wake up after procedure  . Penicillins Other (See Comments)    welps  . Amitriptyline Rash    Social History: Lives Independently, Son lives in her home.     reports that she has quit smoking. She does not have any smokeless tobacco history on file. She reports that she drinks alcohol. She reports that she does not use illicit drugs.     Family History:     Patient reports that her Parents and children 3 Sons and 1 Daughter have NO Medical Problems   Physical Exam:  GEN:  Pleasant Elderly  Thin 81 y.o. Caucasian female examined and in no acute distress; cooperative with exam Filed Vitals:   06/29/14 2345 06/30/14 0000 06/30/14 0015 06/30/14 0048  BP:    145/85  Pulse:    83  Temp:    98 F (36.7 C)  TempSrc:    Oral  Resp: 16 13 13 16   SpO2:    96%   Blood pressure 145/85, pulse 83, temperature 98 F (36.7 C), temperature source Oral, resp. rate 16, SpO2 96 %. PSYCH: She is alert and oriented x4; does not appear anxious does not appear depressed; affect is normal HEENT:  Normocephalic and Atraumatic, Mucous membranes pink; PERRLA; EOM intact; Fundi:  Benign;  No scleral icterus, Nares: Patent, Oropharynx: Clear, Fair Dentition,    Neck:  FROM, No Cervical Lymphadenopathy nor Thyromegaly or Carotid Bruit; No JVD; Breasts:: Not examined CHEST WALL: No tenderness CHEST: Normal respiration, clear to auscultation bilaterally HEART: Regular rate and rhythm; no murmurs rubs or gallops BACK: No kyphosis or scoliosis; No CVA tenderness ABDOMEN: Positive Bowel Sounds, Scaphoid, Soft Non-Tender, No Rebound or Guarding; No Masses, No Organomegaly. Rectal Exam: Not done EXTREMITIES: No Cyanosis Clubbing or Edema Genitalia: not examined PULSES: 2+ and symmetric SKIN: Normal hydration no rash or ulceration CNS:  Alert and Oriented x 4, No Focal Deficits Vascular: pulses palpable throughout    Labs on Admission:  Basic Metabolic Panel:  Recent Labs Lab 06/29/14 2136  NA 139  K 3.8  CL 107  CO2 24  GLUCOSE 118*  BUN 16  CREATININE 0.91  CALCIUM 9.2   Liver Function Tests: No results for input(s): AST, ALT, ALKPHOS, BILITOT, PROT, ALBUMIN in the last 168 hours. No results for input(s): LIPASE, AMYLASE in the last 168 hours. No results for input(s): AMMONIA in the last 168 hours. CBC:  Recent Labs Lab 06/29/14 2136  WBC 12.2*  HGB 13.2  HCT 39.6  MCV 94.1  PLT 135*   Cardiac Enzymes: No results for input(s): CKTOTAL, CKMB, CKMBINDEX, TROPONINI in the last 168 hours.  BNP (last 3 results) No results for input(s): BNP in the last 8760 hours.  ProBNP (last 3 results) No results for input(s): PROBNP in the last 8760 hours.  CBG: No results for input(s): GLUCAP in the last 168 hours.  Radiological Exams on Admission: Dg Hip Unilat With Pelvis 2-3 Views Left  06/29/2014   CLINICAL DATA:  Left hip pain after falling night. Lost her balance and fell on the left side. Immediate pain in the hip.  EXAM: LEFT HIP (WITH PELVIS) 2-3 VIEWS  COMPARISON:   None.  FINDINGS: There are acute fractures of the left superior and inferior pubic rami. The femurs appear intact. No evidence for dislocation. Bones appear osteopenic.  IMPRESSION: Fractures of the left superior and inferior pubic rami.   Electronically Signed   By: Norva Pavlov M.D.   On: 06/29/2014 22:45      Assessment/Plan:   81 y.o. female with  Active Problems:   1.   Bilateral pubic rami fractures/Pelvic fracture   Pain control with IV Dilaudid PRN   consider Options for Rehab Center Placement    Patient feels that she should be able to be managed at home     2.   Thrombocytopenia   Monitor PLTs     3.   Tremor- ? Benign Essential Tremor   Check TSH   Refer to Neuro for Outpatient evaluation     4.   DVT prophylaxis   SCDs  Code Status:     FULL CODE       Family Communication:    No Family Present    Disposition Plan:    Inpatient Status        Time spent:  12 Minutes      Ron Parker [facility] Pager [phone]   If 7AM -7PM Please Contact the Day Rounding Team MD for [facility]  If 7PM-7AM, Please Contact Night-Floor Coverage  [email or web address] Password [email or web address] 06/30/2014, 1:01 AM     ADDENDUM:   Patient was seen and examined on 06/30/2014

## 2014-07-01 DIAGNOSIS — S32592A Other specified fracture of left pubis, initial encounter for closed fracture: Secondary | ICD-10-CM | POA: Diagnosis not present

## 2014-07-01 DIAGNOSIS — S32502A Unspecified fracture of left pubis, initial encounter for closed fracture: Secondary | ICD-10-CM | POA: Diagnosis not present

## 2014-07-01 DIAGNOSIS — S32501A Unspecified fracture of right pubis, initial encounter for closed fracture: Secondary | ICD-10-CM | POA: Diagnosis not present

## 2014-07-01 DIAGNOSIS — R251 Tremor, unspecified: Secondary | ICD-10-CM | POA: Diagnosis not present

## 2014-07-01 MED ORDER — OXYCODONE HCL 5 MG PO TABS: 5.0000 mg | ORAL_TABLET | Freq: Four times a day (QID) | ORAL | Status: DC | PRN

## 2014-07-01 MED ORDER — ACETAMINOPHEN 500 MG PO TABS: 1000.0000 mg | ORAL_TABLET | Freq: Three times a day (TID) | ORAL | Status: DC | PRN

## 2014-07-01 MED ORDER — POLYETHYLENE GLYCOL 3350 17 G PO PACK: 17.0000 g | PACK | Freq: Every day | ORAL | Status: DC

## 2014-07-01 NOTE — Plan of Care (Signed)
Problem: Phase I Progression Outcomes Goal: OOB as tolerated unless otherwise ordered Outcome: Progressing Pt able to walk a few steps with PT.  Able to stay out of bed up in chair for sustained period today.  Agreed to take pain meds this pm which improved tolerance.

## 2014-07-01 NOTE — Progress Notes (Signed)
Patient ID: Yolanda Shaw, female   DOB: Jun 07, 1931, 81 y.o.   MRN: [id number] No acute changes.  Mild to moderate pain from her left-sided pelvic fractures, but only taking tylenol.  Very slow mobility with therapy.  Continue WBAT with therapy and HHPT is possible.  I can see her in follow-up in 2 weeks at my office.

## 2014-07-01 NOTE — Progress Notes (Addendum)
PATIENT DETAILS Name: Yolanda Shaw Age: 81 y.o. Sex: female Date of Birth: 04-11-1931 Admit Date: 06/29/2014 Admitting Physician Ron Parker, MD ZOX:[id number], Oleh Genin, MD  Subjective: Pain well controlled-wants tylenol only prn. Requesting discharge  Assessment/Plan: Principal Problem: Bilateral pubic rami fractures: Secondary to a mechanical fall. Seen by orthopedics, recommendations are weightbearing as tolerated. Seen by physical therapy-recommendations are for SNF-however patient is wanting to [facility] home. Will speak with family and will likely arrange for Home health services-if SNF is not an option. Change tylenol to prn for mild pain and Oxycodone for moderate pain.  Active Problems: Tremor: Being worked up as an outpatient at [facility]. Patient has a scheduled appointment with neurology in the next few weeks. Defer further workup to the outpatient setting.  Disposition: Remain inpatient- home health PT on discharge  Antimicrobial agents  See below  Anti-infectives    None      DVT Prophylaxis: Prophylactic Lovenox  Code Status: Full code   Family Communication None at bedside  Procedures: None  CONSULTS:  orthopedic surgery  Time spent 25 minutes-Greater than 50% of this time was spent in counseling, explanation of diagnosis, planning of further management, and coordination of care.  MEDICATIONS: Scheduled Meds: . acetaminophen  1,000 mg Oral TID  . enoxaparin (LOVENOX) injection  40 mg Subcutaneous Q24H  . polyethylene glycol  17 g Oral Daily   Continuous Infusions:   PRN Meds:.alum & mag hydroxide-simeth, HYDROmorphone (DILAUDID) injection, ondansetron **OR** ondansetron (ZOFRAN) IV, oxyCODONE    PHYSICAL EXAM: Vital signs in last 24 hours: Filed Vitals:   06/30/14 1413 06/30/14 2050 07/01/14 0020 07/01/14 0430  BP: 140/75 128/76 146/84 159/91  Pulse: 75 65 72 98  Temp: 98.2 F (36.8 C) 98.2 F (36.8 C) 97.9 F (36.6 C)  97.9 F (36.6 C)  TempSrc: Oral Oral Oral Oral  Resp: Height:      Weight:      SpO2: 97% 97% 98% 97%    Weight change:  Filed Weights   06/30/14 0048  Weight: 52.209 kg (115 lb 1.6 oz)   Body mass index is 18.02 kg/(m^2).   Gen Exam: Awake and alert with clear speech.   Neck: Supple, No JVD.   Chest: B/L Clear.   CVS: S1 S2 Regular, no murmurs.  Abdomen: soft, BS +, non tender, non distended.  Extremities: no edema, lower extremities warm to touch. Neurologic: Non Focal.   Skin: No Rash.   Wounds: N/A.   Intake/Output from previous day:  Intake/Output Summary (Last 24 hours) at 07/01/14 0859 Last data filed at 07/01/14 0600  Gross per 24 hour  Intake 1110.83 ml  Output   1400 ml  Net -289.17 ml     LAB RESULTS: CBC  Recent Labs Lab 06/29/14 2136 06/30/14 0506  WBC 12.2* 7.4  HGB 13.2 12.2  HCT 39.6 36.6  PLT 135* 131*  MCV 94.1 93.8  MCH 31.4 31.3  MCHC 33.3 33.3  RDW 13.9 13.8    Chemistries   Recent Labs Lab 06/29/14 2136 06/30/14 0506  NA 139 136  K 3.8 3.5  CL 107 103  CO2 24 27  GLUCOSE 118* 142*  BUN 16 14  CREATININE 0.91 0.89  CALCIUM 9.2 8.5*    CBG: No results for input(s): GLUCAP in the last 168 hours.  GFR Estimated Creatinine Clearance: 41.5 mL/min (by C-G formula based on Cr  of 0.89).  Coagulation profile  Recent Labs Lab 06/29/14 2136  INR 1.11    Cardiac Enzymes No results for input(s): CKMB, TROPONINI, MYOGLOBIN in the last 168 hours.  Invalid input(s): CK  Invalid input(s): POCBNP No results for input(s): DDIMER in the last 72 hours. No results for input(s): HGBA1C in the last 72 hours. No results for input(s): CHOL, HDL, LDLCALC, TRIG, CHOLHDL, LDLDIRECT in the last 72 hours. No results for input(s): TSH, T4TOTAL, T3FREE, THYROIDAB in the last 72 hours.  Invalid input(s): FREET3 No results for input(s): VITAMINB12, FOLATE, FERRITIN, TIBC, IRON, RETICCTPCT in the last 72 hours. No results  for input(s): LIPASE, AMYLASE in the last 72 hours.  Urine Studies No results for input(s): UHGB, CRYS in the last 72 hours.  Invalid input(s): UACOL, UAPR, USPG, UPH, UTP, UGL, UKET, UBIL, UNIT, UROB, ULEU, UEPI, UWBC, URBC, UBAC, CAST, UCOM, BILUA  MICROBIOLOGY: No results found for this or any previous visit (from the past 240 hour(s)).  RADIOLOGY STUDIES/RESULTS: Dg Hip Unilat With Pelvis 2-3 Views Left  06/29/2014   CLINICAL DATA:  Left hip pain after falling night. Lost her balance and fell on the left side. Immediate pain in the hip.  EXAM: LEFT HIP (WITH PELVIS) 2-3 VIEWS  COMPARISON:  None.  FINDINGS: There are acute fractures of the left superior and inferior pubic rami. The femurs appear intact. No evidence for dislocation. Bones appear osteopenic.  IMPRESSION: Fractures of the left superior and inferior pubic rami.   Electronically Signed   By: Norva Pavlov M.D.   On: 06/29/2014 22:45    Jeoffrey Massed, MD  [facility] Pager:[phone]  If 7PM-7AM, please contact night-coverage [email or web address] Password [email or web address] 07/01/2014, 8:59 AM   LOS: 2 days

## 2014-07-01 NOTE — Discharge Summary (Addendum)
PATIENT DETAILS Name: Yolanda Shaw Age: 81 y.o. Sex: female Date of Birth: 09-16-1931 MRN: [id number]. Admitting Physician: Ron Parker, MD WUJ:[id number], Oleh Genin, MD  Admit Date: 06/29/2014 Discharge date: 07/02/2014  Recommendations for Outpatient Follow-up:  1. Ensure follow up with Dr Lanny Cramp.  PRIMARY DISCHARGE DIAGNOSIS:  Principal Problem:   Bilateral pubic rami fractures Active Problems:   Pelvic fracture   Thrombocytopenia   Tremor   Fracture of left pubis   Ischium fracture   Left hip pain   Pressure ulcer      PAST MEDICAL HISTORY: History reviewed. No pertinent past medical history.  DISCHARGE MEDICATIONS: Current Discharge Medication List    START taking these medications   Details  !! acetaminophen (TYLENOL) 500 MG tablet Take 2 tablets (1,000 mg total) by mouth every 8 (eight) hours as needed. Refills: 0    !! acetaminophen (TYLENOL) 500 MG tablet Take 2 tablets (1,000 mg total) by mouth 3 (three) times daily as needed for mild pain.    oxyCODONE (OXY IR/ROXICODONE) 5 MG immediate release tablet Take 1 tablet (5 mg total) by mouth every 6 (six) hours as needed for moderate pain. Qty: 30 tablet, Refills: 0    polyethylene glycol (MIRALAX / GLYCOLAX) packet Take 17 g by mouth daily. Qty: 14 each, Refills: 0     !! - Potential duplicate medications found. Please discuss with provider.    CONTINUE these medications which have NOT CHANGED   Details  Ascorbic Acid (VITAMIN C PO) Take 1 tablet by mouth daily.    Cholecalciferol (VITAMIN D PO) Take 1 tablet by mouth daily.    Multiple Vitamins-Minerals (MULTIVITAMIN ADULT PO) Take 1 tablet by mouth daily.    OVER THE COUNTER MEDICATION Take 1 tablet by mouth 3 (three) times daily. Cardio plus      STOP taking these medications     cephALEXin (KEFLEX) 500 MG capsule         ALLERGIES:   Allergies  Allergen Reactions  . Ether Other (See Comments)    Hard to wake up after  procedure  . Penicillins Other (See Comments)    welps  . Amitriptyline Rash    BRIEF HPI:  See H&P, Labs, Consult and Test reports for all details in brief, patient is a 81 year old female who presented to the ED after a mechanical fall, she was found to have bilateral pubic fractures. She was subsequently admitted for further evaluation and treatment  CONSULTATIONS:   orthopedic surgery  PERTINENT RADIOLOGIC STUDIES: Dg Hip Unilat With Pelvis 2-3 Views Left  06/29/2014   CLINICAL DATA:  Left hip pain after falling night. Lost her balance and fell on the left side. Immediate pain in the hip.  EXAM: LEFT HIP (WITH PELVIS) 2-3 VIEWS  COMPARISON:  None.  FINDINGS: There are acute fractures of the left superior and inferior pubic rami. The femurs appear intact. No evidence for dislocation. Bones appear osteopenic.  IMPRESSION: Fractures of the left superior and inferior pubic rami.   Electronically Signed   By: Norva Pavlov M.D.   On: 06/29/2014 22:45     PERTINENT LAB RESULTS: CBC:  Recent Labs  06/29/14 2136 06/30/14 0506  WBC 12.2* 7.4  HGB 13.2 12.2  HCT 39.6 36.6  PLT 135* 131*   CMET CMP     Component Value Date/Time   NA 136 06/30/2014 0506   K 3.5 06/30/2014 0506   CL 103 06/30/2014 0506   CO2 27 06/30/2014 0506   GLUCOSE  142* 06/30/2014 0506   BUN 14 06/30/2014 0506   CREATININE 0.89 06/30/2014 0506   CALCIUM 8.5* 06/30/2014 0506   PROT 7.1 01/03/2013 1710   ALBUMIN 4.0 01/03/2013 1710   AST 26 01/03/2013 1710   ALT 20 01/03/2013 1710   ALKPHOS 66 01/03/2013 1710   BILITOT 0.4 01/03/2013 1710   GFRNONAA 60* 06/30/2014 0506   GFRAA >60 06/30/2014 0506    GFR Estimated Creatinine Clearance: 41.5 mL/min (by C-G formula based on Cr of 0.89). No results for input(s): LIPASE, AMYLASE in the last 72 hours. No results for input(s): CKTOTAL, CKMB, CKMBINDEX, TROPONINI in the last 72 hours. Invalid input(s): POCBNP No results for input(s): DDIMER in the last  72 hours. No results for input(s): HGBA1C in the last 72 hours. No results for input(s): CHOL, HDL, LDLCALC, TRIG, CHOLHDL, LDLDIRECT in the last 72 hours. No results for input(s): TSH, T4TOTAL, T3FREE, THYROIDAB in the last 72 hours.  Invalid input(s): FREET3 No results for input(s): VITAMINB12, FOLATE, FERRITIN, TIBC, IRON, RETICCTPCT in the last 72 hours. Coags:  Recent Labs  06/29/14 2136  INR 1.11   Microbiology: No results found for this or any previous visit (from the past 240 hour(s)).   BRIEF HOSPITAL COURSE:  Bilateral pubic rami fractures: Secondary to a mechanical fall. Seen by orthopedics, recommendations are weightbearing as tolerated. Seen by physical therapy-recommendations are for SNF. Change tylenol to prn for mild pain and Oxycodone for moderate pain.  Active Problems: Tremor: Being worked up as an outpatient at [facility]. Patient has a scheduled appointment with neurology in the next few weeks. Defer further workup to the outpatient setting.  TODAY-DAY OF DISCHARGE:  Subjective:   Yolanda Shaw today has no headache,no chest abdominal pain,no new weakness tingling or numbness. Pain is relatively well controlled with current narcotics/analgesics  Objective:   Blood pressure 141/92, pulse 88, temperature 98.6 F (37 C), temperature source Oral, resp. rate 14, height 5\' 7"  (1.702 m), weight 52.209 kg (115 lb 1.6 oz), SpO2 96 %.  Intake/Output Summary (Last 24 hours) at 07/02/14 0921 Last data filed at 07/02/14 0800  Gross per 24 hour  Intake    860 ml  Output   1575 ml  Net   -715 ml   Filed Weights   06/30/14 0048  Weight: 52.209 kg (115 lb 1.6 oz)    Exam Awake Alert, Oriented *3, No new F.N deficits, Normal affect [place].AT,PERRAL Supple Neck,No JVD, No cervical lymphadenopathy appriciated.  Symmetrical Chest wall movement, Good air movement bilaterally, CTAB RRR,No Gallops,Rubs or new Murmurs, No Parasternal Heave +ve B.Sounds, Abd Soft, Non tender,  No organomegaly appriciated, No rebound -guarding or rigidity. No Cyanosis, Clubbing or edema, No new Rash or bruise  DISCHARGE CONDITION: Stable  DISPOSITION: SNF  DISCHARGE INSTRUCTIONS:    Activity:  As tolerated with Full fall precautions use walker/cane & assistance as needed  Diet recommendation: Regular Diet   Discharge Instructions    Call MD for:  severe uncontrolled pain    Complete by:  As directed      Diet general    Complete by:  As directed      Increase activity slowly    Complete by:  As directed            Follow-up Information    Follow up with Riley Churches, MD. Schedule an appointment as soon as possible for a visit in 1 week.   Specialty:  Internal Medicine   Contact information:   [address] [place] [address] [phone]       Follow up with Kathryne Hitch, MD. Schedule an appointment as soon as possible for a visit in 2 weeks.   Specialty:  Orthopedic Surgery   Contact information:   [address] [place] [address] [phone]      Total Time spent on discharge equals 25  minutes.  SignedJeoffrey Massed 07/02/2014 9:21 AM

## 2014-07-01 NOTE — Progress Notes (Signed)
Utilization review completed on 06/30/14.  Raymond Gurney RN, BSN, CM

## 2014-07-01 NOTE — Clinical Social Work Note (Signed)
Clinical Social Work Assessment  Patient Details  Name: Yolanda Shaw MRN: [id number] Date of Birth: 1931-07-15  Date of referral:  07/01/14               Reason for consult:  Facility Placement                Permission sought to share information with:  Facility Sport and exercise psychologist, Family Supports Permission granted to share information::  Yes, Verbal Permission Granted  Name::        Agency::     Relationship::     Contact Information:     Housing/Transportation Living arrangements for the past 2 months:  Single Family Home Source of Information:  Patient, Adult Children, Other (Comment Required) (grandson Psychologist, prison and probation services) Patient Interpreter Needed:  None Criminal Activity/Legal Involvement Pertinent to Current Situation/Hospitalization:    Significant Relationships:  Adult Children, Other(Comment) (grandson Psychologist, prison and probation services) Lives with:  Adult Children Do you feel safe going back to the place where you live?    Need for family participation in patient care:  Yes (Comment)  Care giving concerns:  Pt's son Leroy Sea and grandson Merrily Pew are concerned if pt refuses to go to SNF she will not e safe at home   Social Worker assessment / plan:  CSW met with pt and her son Leroy Sea, grandson [name] at bedside.  CSW explained role.  CSW provided explanation of SNF process.  CSW encouraged pt and family to discuss history and needs.  CSW provided supportive listening.  CSW will fax pt information to SNF's in [place] area.  Employment status:  Retired Surveyor, [employer] Care PT Recommendations:  [place] / Referral to community resources:     Patient/Family's Response to care:  Pt stated that she did not want to go to SNF and would be ok at home.  Pt stated that she would know if she needed SNF.  Pt stated that she was able to stand up for PT yesterday.  Pt resistant to rehab but finally agreed to let CSW send information out.  Pt's son Leroy Sea lives with her and does not  work.  He is available but can not lift more than 15 pounds due to his own health issues.  Pt's grandson very supportive and calm redirected pt back to rehab and eventually she agreed to go to rehab although she gets confused at times and backtracks.  Patient/Family's Understanding of and Emotional Response to Diagnosis, Current Treatment, and Prognosis:  Pt has limited understanding of her diagnosis.  Family appears to understand the need for SNF and helped to calmly talk to pt about her needing rehab  Emotional Assessment Appearance:  Appears stated age Attitude/Demeanor/Rapport:  Angry, Uncooperative Affect (typically observed):  Agitated, Angry, Withdrawn, Irritable Orientation:  Fluctuating Orientation (Suspected and/or reported Sundowners) Alcohol / Substance use:    Psych involvement (Current and /or in the community):     Discharge Needs  Concerns to be addressed:   (pt wavers on going to SNF unsafe if she goes home) Readmission within the last 30 days:    Current discharge risk:    Barriers to Discharge:      Caswell Corwin 07/01/2014, 3:37 PM

## 2014-07-01 NOTE — Care Management Note (Addendum)
Case Management Note  Patient Details  Name: Yolanda Shaw MRN: [id number] Date of Birth: January 25, 1931  Subjective/Objective: bilateral pelvic fracture                    Action/Plan: To snf at discharge vs home with home health  Expected Discharge Date:                  Expected Discharge Plan:  Home w Home Health Services  In-House Referral:     Discharge planning Services  CM Consult  Post Acute Care Choice:    Choice offered to:  Adult Children  DME Arranged:    DME Agency:     HH Arranged:    HH Agency:     Status of Service:  In process, will continue to follow  Medicare Important Message Given:    Date Medicare IM Given:    Medicare IM give by:    Date Additional Medicare IM Given:    Additional Medicare Important Message give by:     If discussed at Long Length of Stay Meetings, dates discussed:    Additional Comments:  Addendum to 06/30/14 Patient not meeting Inpatient status Dr. Quillian Quince notified for Second level review decision for Observation status. Hospitalist on duty made aware of UR review results.   Spoke to patient son and grandson this am explained Code 44 status change from Inpatient to Observation status as of 06/30/14, reviewed the form and answered questions for family and patient.  Copy of signed form given to grandson in the presence of son and patient. Social worker had spoken too family prior to my entering room.  SNF search in process,  further CM needs pending results.     Barnett Applebaum, RN 07/01/2014, 11:52 AM

## 2014-07-01 NOTE — Progress Notes (Signed)
Physical Therapy Treatment Patient Details Name: Yolanda Shaw MRN: [id number] DOB: [birth year] Today's Date: 07/01/2014    History of Present Illness 81 yo female admitted with L sup/inf pubic rami fractures.     PT Comments    Progressing very slowly with mobility. Continues to require +2 for assistance with safe mobility. Family present during session today. Explained that pt will require +2 assist for ADLs and mobility at home. They observed session and are attempting to get pt to agree to placement. Continue to recommend ST rehab at SNF if this is possible. Otherwise, pt will need 24 hour care at home.  Follow Up Recommendations  Supervision/Assistance - 24 hour;SNF     Equipment Recommendations  Rolling walker with 5" wheels;Wheelchair ;Wheelchair cushion ;3in1    Recommendations for Other Services OT consult     Precautions / Restrictions Precautions Precautions: Fall Restrictions Weight Bearing Restrictions: No LLE Weight Bearing: Weight bearing as tolerated    Mobility  Bed Mobility               General bed mobility comments: pt oob in recliner  Transfers Overall transfer level: Needs assistance Equipment used: Rolling walker (2 wheeled) Transfers: Sit to/from Stand Sit to Stand: Mod assist         General transfer comment: Assist to rise, stabilize, control descent. VCS safety, technique, hand/LE placement  Ambulation/Gait Ambulation/Gait assistance: Min assist;+2 physical assistance;+2 safety/equipment Ambulation Distance (Feet): 5 Feet Assistive device: Rolling walker (2 wheeled) Gait Pattern/deviations: Step-to pattern;Trunk flexed;Antalgic     General Gait Details: VCs safety, technique, posture, step length, distance from RW. Assist to stabilize. followed closely with recliner.    Stairs            Wheelchair Mobility    Modified Rankin (Stroke Patients Only)       Balance           Standing balance support: Bilateral  upper extremity supported;During functional activity Standing balance-Leahy Scale: Poor                      Cognition Arousal/Alertness: Awake/alert Behavior During Therapy: WFL for tasks assessed/performed Overall Cognitive Status: Impaired/Different from baseline Area of Impairment: Memory;Safety/judgement     Memory: Decreased short-term memory;Decreased recall of precautions   Safety/Judgement: Decreased awareness of deficits;Decreased awareness of safety          Exercises      General Comments        Pertinent Vitals/Pain Pain Assessment: Faces Faces Pain Scale: Hurts even more Pain Location: L LE with activity Pain Descriptors / Indicators: Sore;Sharp;Aching Pain Intervention(s): Monitored during session;Limited activity within patient's tolerance;Repositioned    Home Living                      Prior Function            PT Goals (current goals can now be found in the care plan section) Progress towards PT goals: Progressing toward goals (slowly)    Frequency  Min 3X/week    PT Plan Discharge plan needs to be updated    Co-evaluation             End of Session Equipment Utilized During Treatment: Gait belt Activity Tolerance: Patient limited by fatigue;Patient limited by pain Patient left: in chair;with call bell/phone within reach;with family/visitor present     Time: 1136-1150 PT Time Calculation (min) (ACUTE ONLY): 14 min  Charges:  $Gait Training: 8-22 mins  G Codes:      Weston Anna, MPT Pager: [phone]

## 2014-07-01 NOTE — Discharge Instructions (Signed)
Increase activities as comfort allows. Only use a walker when up and moving.

## 2014-07-01 NOTE — Progress Notes (Signed)
OT Note  Patient Details Name: Yolanda Shaw MRN: [id number] DOB: [birth year]   Cancelled Treatment:    Spoke with pt and grandson regarding role of OT and ADL activity . Pt and family agreeable to SNF. OT shared with [place], SW as well as PT.  Will perform OT eval next day. Alba Cory 07/01/2014, 12:52 PM

## 2014-07-01 NOTE — Progress Notes (Signed)
   06/30/14 1129  PT Time Calculation  PT Start Time (ACUTE ONLY) 1032  PT Stop Time (ACUTE ONLY) 1053  PT Time Calculation (min) (ACUTE ONLY) 21 min  PT G-Codes **NOT FOR INPATIENT CLASS**  Functional Assessment Tool Used (clinical judgement)  Functional Limitation Mobility: Walking and moving around  Mobility: Walking and Moving Around Current Status (R4431) CK  Mobility: Walking and Moving Around Goal Status (V4008) CJ  PT General Charges  $$ ACUTE PT VISIT 1 Procedure  PT Evaluation  $Initial PT Evaluation Tier I 1 Procedure   Rebeca Alert, MPT [phone]

## 2014-07-02 DIAGNOSIS — R251 Tremor, unspecified: Secondary | ICD-10-CM | POA: Diagnosis not present

## 2014-07-02 DIAGNOSIS — S32509A Unspecified fracture of unspecified pubis, initial encounter for closed fracture: Secondary | ICD-10-CM | POA: Diagnosis not present

## 2014-07-02 DIAGNOSIS — S32502A Unspecified fracture of left pubis, initial encounter for closed fracture: Secondary | ICD-10-CM | POA: Diagnosis not present

## 2014-07-02 DIAGNOSIS — S32592A Other specified fracture of left pubis, initial encounter for closed fracture: Secondary | ICD-10-CM | POA: Diagnosis not present

## 2014-07-02 DIAGNOSIS — S32501A Unspecified fracture of right pubis, initial encounter for closed fracture: Secondary | ICD-10-CM | POA: Diagnosis not present

## 2014-07-02 MED ORDER — OXYCODONE HCL 5 MG PO TABS: 5.0000 mg | ORAL_TABLET | Freq: Four times a day (QID) | ORAL | Status: DC | PRN

## 2014-07-02 MED ORDER — ACETAMINOPHEN 500 MG PO TABS: 1000.0000 mg | ORAL_TABLET | Freq: Three times a day (TID) | ORAL | Status: DC | PRN

## 2014-07-02 NOTE — Evaluation (Signed)
Occupational Therapy Evaluation Patient Details Name: Yolanda Shaw MRN: [id number] DOB: Jun 01, 1931 Today's Date: 07/02/2014    History of Present Illness 81 yo female admitted with L sup/inf pubic rami fractures.    Clinical Impression   Pt admitted with pubic fracture. Pt currently with functional limitations due to the deficits listed below (see OT Problem List).  Pt will benefit from skilled OT to increase their safety and independence with ADL and functional mobility for ADL to facilitate discharge to venue listed below.      Follow Up Recommendations  SNF    Equipment Recommendations  3 in 1 bedside comode    Recommendations for Other Services       Precautions / Restrictions Precautions Precautions: Fall Restrictions Weight Bearing Restrictions: Yes LLE Weight Bearing: Weight bearing as tolerated      Mobility Bed Mobility                  Transfers Overall transfer level: Needs assistance Equipment used: Rolling walker (2 wheeled) Transfers: Sit to/from Stand Sit to Stand: Mod assist Stand pivot transfers: Mod assist;+2 safety/equipment       General transfer comment: Assist to rise, stabilize, control descent. VCS safety, technique, hand/LE placement         ADL Overall ADL's : Needs assistance/impaired                     Lower Body Dressing: Maximal assistance;Sit to/from stand   Toilet Transfer: Moderate assistance;BSC;Cueing for safety;Cueing for sequencing;Stand-pivot;RW   Toileting- Clothing Manipulation and Hygiene: Maximal assistance;Sit to/from stand       Functional mobility during ADLs: Maximal assistance;Rolling walker                 Pertinent Vitals/Pain Faces Pain Scale: Hurts even more Pain Location: LLE Pain Descriptors / Indicators: Sore;Sharp Pain Intervention(s): Limited activity within patient's tolerance;Monitored during session     Hand Dominance     Extremity/Trunk Assessment Upper Extremity  Assessment Upper Extremity Assessment: Generalized weakness           Communication Communication Communication: No difficulties   Cognition Arousal/Alertness: Awake/alert Behavior During Therapy: WFL for tasks assessed/performed Overall Cognitive Status: Impaired/Different from baseline Area of Impairment: Memory;Safety/judgement     Memory: Decreased short-term memory;Decreased recall of precautions   Safety/Judgement: Decreased awareness of deficits;Decreased awareness of safety                    Home Living Family/patient expects to be discharged to:: Private residence Living Arrangements: Children Available Help at Discharge: Family Type of Home: House Home Access: Stairs to enter Secretary/administrator of Steps: 1   Home Layout: One level     Bathroom Shower/Tub: Chief Strategy Officer: Standard     Home Equipment: None          Prior Functioning/Environment Level of Independence: Independent             OT Diagnosis: Generalized weakness   OT Problem List: Decreased strength;Decreased activity tolerance;Pain   OT Treatment/Interventions: Self-care/ADL training    OT Goals(Current goals can be found in the care plan section) Acute Rehab OT Goals Patient Stated Goal: less pain OT Goal Formulation: With patient Time For Goal Achievement: 07/09/14 Potential to Achieve Goals: Good  OT Frequency: Min 2X/week   Barriers to D/C:            Co-evaluation  End of Session    Activity Tolerance: Patient tolerated treatment well Patient left: in chair;with call bell/phone within reach;with family/visitor present   Time: 1010-1030 OT Time Calculation (min): 20 min Charges:  OT General Charges $OT Visit: 1 Procedure OT Evaluation $Initial OT Evaluation Tier I: 1 Procedure G-Codes: OT G-codes **NOT FOR INPATIENT CLASS** Functional Assessment Tool Used: clinical observation Functional Limitation: Self  care Self Care Current Status (Z6109): At least 60 percent but less than 80 percent impaired, limited or restricted Self Care Goal Status (U0454): At least 1 percent but less than 20 percent impaired, limited or restricted  Madai Nuccio, [place]ung 07/02/2014, 10:37 AM

## 2014-07-02 NOTE — Progress Notes (Signed)
Physical Therapy Treatment Patient Details Name: Yolanda Shaw MRN: [id number] DOB: 08/26/1931 Today's Date: 07/02/2014    History of Present Illness 81 yo female admitted with L sup/inf pubic rami fractures.     PT Comments    Pt OOB in recliner after OT session.  Very uncomfortable sitting.  Assisted with amb a limited distance due to pain level and instability.  Poor self corrective awareness and delayed balance response in which pt demonstrated X 2 posterior LOB (therapist recovered).  Max c/o L hip pain and limited activity tolerance.  HIGH FALL RISK.   Follow Up Recommendations  Supervision/Assistance - 24 hour;SNF     Equipment Recommendations  Rolling walker with 5" wheels;Wheelchair (measurements PT);Wheelchair cushion (measurements PT);3in1 (PT)    Recommendations for Other Services       Precautions / Restrictions Precautions Precautions: Fall Restrictions Weight Bearing Restrictions: No LLE Weight Bearing: Weight bearing as tolerated    Mobility  Bed Mobility               General bed mobility comments: pt oob in recliner  Transfers Overall transfer level: Needs assistance Equipment used: Rolling walker (2 wheeled) Transfers: Sit to/from Stand Sit to Stand: Mod assist Stand pivot transfers: Mod assist;+2 safety/equipment       General transfer comment: 50% VC's on proper tech and hand placement.  Severe posterior lean with LOB three times attempting to rise out of chair.  Delayed self correction, therapist recovered.    Ambulation/Gait Ambulation/Gait assistance: Min assist;Mod assist Ambulation Distance (Feet): 7 Feet Assistive device: Rolling walker (2 wheeled) Gait Pattern/deviations: Step-to pattern;Decreased stance time - left;Trunk flexed Gait velocity: decreased   General Gait Details: 75% Vc's pn proper walker to self distance and sequencing.  Poor self midline awareness.  Posterior LOB x 2 in which Therapist recovered.  No c/o dizziness.   HIGH FALL RISK.   Stairs            Wheelchair Mobility    Modified Rankin (Stroke Patients Only)       Balance                                    Cognition Arousal/Alertness: Awake/alert Behavior During Therapy: WFL for tasks assessed/performed Overall Cognitive Status: Impaired/Different from baseline Area of Impairment: Memory;Safety/judgement     Memory: Decreased short-term memory;Decreased recall of precautions   Safety/Judgement: Decreased awareness of deficits;Decreased awareness of safety          Exercises      General Comments        Pertinent Vitals/Pain Pain Assessment: 0-10 Pain Score: 8  Faces Pain Scale: Hurts even more Pain Location: L hip Pain Descriptors / Indicators: Constant Pain Intervention(s): Monitored during session;Repositioned    Home Living Family/patient expects to be discharged to:: Private residence Living Arrangements: Children Available Help at Discharge: Family Type of Home: House Home Access: Stairs to enter   Home Layout: One level Home Equipment: None      Prior Function Level of Independence: Independent          PT Goals (current goals can now be found in the care plan section) Acute Rehab PT Goals Patient Stated Goal: less pain Progress towards PT goals: Progressing toward goals    Frequency  Min 3X/week    PT Plan      Co-evaluation             End  of Session Equipment Utilized During Treatment: Gait belt Activity Tolerance: Patient limited by fatigue Patient left: in chair;with call bell/phone within reach;with family/visitor present;with chair alarm set     Time: [id number] PT Time Calculation (min) (ACUTE ONLY): 13 min  Charges:  $Gait Training: 8-22 mins                    G Codes:      [name]  PTA [facility]  Acute  Rehab Pager      [phone]

## 2014-07-02 NOTE — Progress Notes (Signed)
Report called to [facility]. All questions answered. Pt left with PTAR in NAD. IV d/c'ed with no issues. Grandson at bedside. Pt alert and oriented at time of discharge.

## 2014-07-02 NOTE — Progress Notes (Addendum)
CSW continuing to follow.   CSW received notification from MD that pt medically ready for discharge today.  CSW met with pt and pt grandson, [name] at bedside to discuss SNF bed offers. CSW explained that pt has insurance that requires authorization for placement and discussed that it will be important to have more than one option in mind as pt cannot remain in hospital until authorization received.   CSW reviewed list of SNF bed offers and pt first choice is [facility] and pt second choice is [facility].   CSW contacted [facility] and facility has bed availability and has initiated SNF authorization process. Per Isaias Cowman, facility will not accept a letter of guarantee if insurance authorization not received by this afternoon.   CSW contacted [facility] and left a message with admissions coordinator.  CSW contacted [facility] Director, Dr. Reynaldo Minium regarding review of pt clinical information to seek approval for letter of guarantee until insurance authorization approved if insurance authorization does not come through by this afternoon. CSW to discuss with [facility] if facility would be willing to take a Letter of Guarantee until insurance authorization received.  CSW has notified pt and pt grandson, Merrily Pew that pt will have to go to facility in which can accept pt today given the insurance situation.   CSW to continue to follow.   Addendum 10:30 am:  CSW continuing to follow.   CSW received phone call from [facility] coordinator, Levi Aland stating that she began process with [facility] and received notification from [facility] that pt primary insurance is NOT [employer] Medicare and [employer] Medicare is only for prescription coverage.  CSW followed up with pt and pt grandson, [name] at bedside. CSW inquired with pt about primary insurance and pt stated that she is unsure of her primary insurance, pt states that she thinks her  primary insurance is traditional medicare. Pt grandson stated that he would go to pt home and gather her insurance cards in order to determine pt primary insurance.  CSW discussed with pt and pt grandson that if primary insurance is Medicare Part A & B that pt is observation only and pt only option for rehab at SNF would be to pay privately. Pt and pt grandson agreeable to CSW inquiring about private pay rates at [facility] and [place] and will discuss with pt son, Leroy Sea what best option will be for disposition.   CSW to await pt grandson to return with pt insurance cards to confirm pt primary insurance-anticipate that pt primary insurance is Medicare Part A & B.   CSW to continue to follow for pt disposition planning.   Addendum 12:30 pm:  CSW followed up with pt and pt grandson. CSW was able to confirm from review of pt insurance information that pt primary insurance is Medicare Part A and B. CSW discussed with pt and pt grandson that option is for SNF as private pay or return home with home health services. CSW provided private pay rate for [facility] and pt and pt grandson are agreeable to private pay at [facility] for ten days.   CSW notified [facility] and confirmed bed availability for today.  CSW to facilitate pt discharge needs this afternoon.   Alison Murray, MSW, [place] Work [phone]

## 2014-07-02 NOTE — Progress Notes (Signed)
Pt for discharge to [facility] as private pay.  CSW facilitated pt discharge needs including contacting facility, faxing pt discharge information via [facility], discussing with pt and pt grandson at bedside, providing RN phone number to call report, and arranging ambulance transport for pt to [facility].  Pt anxious about rehab, but pt grandson has been supportive and encouraged pt that rehab at [facility] is best plan.   No further social work needs identified at this time.  CSW signing off.   Loletta SpecterSuzanna Coila Wardell, MSW, LCSW Clinical Social Work [phone]

## 2014-07-02 NOTE — Plan of Care (Signed)
Problem: Phase I Progression Outcomes Goal: OOB as tolerated unless otherwise ordered Outcome: Completed/Met Date Met:  07/02/14 Pt OOB to [facility] and ambulates some.

## 2014-07-02 NOTE — Clinical Social Work Placement (Signed)
   CLINICAL SOCIAL WORK PLACEMENT  NOTE  Date:  07/02/2014  Patient Details  Name: Yolanda Shaw MRN: [id number] Date of Birth: Jun 13, 1931  Clinical Social Work is seeking post-discharge placement for this patient at the Skilled  Nursing Facility level of care (*CSW will initial, date and re-position this form in  chart as items are completed):  Yes   Patient/family provided with [facility] Clinical Social Work Department's list of facilities offering this level of care within the geographic area requested by the patient (or if unable, by the patient's family).  Yes   Patient/family informed of their freedom to choose among providers that offer the needed level of care, that participate in Medicare, Medicaid or managed care program needed by the patient, have an available bed and are willing to accept the patient.  Yes   Patient/family informed of [facility]'s ownership interest in [facility] and [facility], as well as of the fact that they are under no obligation to receive care at these facilities.  PASRR submitted to EDS on 07/01/14     PASRR number received on 07/01/14     Existing PASRR number confirmed on       FL2 transmitted to all facilities in geographic area requested by pt/family on 07/01/14     FL2 transmitted to all facilities within larger geographic area on       Patient informed that his/her managed care company has contracts with or will negotiate with certain facilities, including the following:        Yes   Patient/family informed of bed offers received.  Patient chooses bed at [facility]     Physician recommends and patient chooses bed at      Patient to be transferred to [facility] on 07/02/14.  Patient to be transferred to facility by ambulance [facility])     Patient family notified on 07/02/14 of transfer.  Name of family member notified:  pt and pt grandson, [name] notified at bedside     PHYSICIAN Please sign FL2     Additional Comment:    Pt d/c to [facility] as private pay as pt is Medicare primary insurance and did not have 3-night inpatient stay for insurance to cover SNF.  _______________________________________________ Orson Eva, LCSW 07/02/2014, 3:27 PM

## 2014-07-03 DIAGNOSIS — S32592D Other specified fracture of left pubis, subsequent encounter for fracture with routine healing: Secondary | ICD-10-CM | POA: Diagnosis not present

## 2014-07-03 DIAGNOSIS — R2681 Unsteadiness on feet: Secondary | ICD-10-CM | POA: Diagnosis not present

## 2014-07-03 DIAGNOSIS — M6281 Muscle weakness (generalized): Secondary | ICD-10-CM | POA: Diagnosis not present

## 2014-07-03 DIAGNOSIS — R278 Other lack of coordination: Secondary | ICD-10-CM | POA: Diagnosis not present

## 2014-07-03 DIAGNOSIS — S32591D Other specified fracture of right pubis, subsequent encounter for fracture with routine healing: Secondary | ICD-10-CM | POA: Diagnosis not present

## 2014-07-03 DIAGNOSIS — Z9181 History of falling: Secondary | ICD-10-CM | POA: Diagnosis not present

## 2014-07-04 DIAGNOSIS — S32591D Other specified fracture of right pubis, subsequent encounter for fracture with routine healing: Secondary | ICD-10-CM | POA: Diagnosis not present

## 2014-07-04 DIAGNOSIS — D696 Thrombocytopenia, unspecified: Secondary | ICD-10-CM | POA: Diagnosis not present

## 2014-07-04 DIAGNOSIS — M6281 Muscle weakness (generalized): Secondary | ICD-10-CM | POA: Diagnosis not present

## 2014-07-04 DIAGNOSIS — R2681 Unsteadiness on feet: Secondary | ICD-10-CM | POA: Diagnosis not present

## 2014-07-04 DIAGNOSIS — Z9181 History of falling: Secondary | ICD-10-CM | POA: Diagnosis not present

## 2014-07-04 DIAGNOSIS — R278 Other lack of coordination: Secondary | ICD-10-CM | POA: Diagnosis not present

## 2014-07-04 DIAGNOSIS — S32592D Other specified fracture of left pubis, subsequent encounter for fracture with routine healing: Secondary | ICD-10-CM | POA: Diagnosis not present

## 2014-07-05 ENCOUNTER — Non-Acute Institutional Stay (SKILLED_NURSING_FACILITY): Payer: Medicare Other | Admitting: Internal Medicine

## 2014-07-05 DIAGNOSIS — S32502S Unspecified fracture of left pubis, sequela: Secondary | ICD-10-CM | POA: Diagnosis not present

## 2014-07-05 DIAGNOSIS — K5900 Constipation, unspecified: Secondary | ICD-10-CM

## 2014-07-05 DIAGNOSIS — S32592D Other specified fracture of left pubis, subsequent encounter for fracture with routine healing: Secondary | ICD-10-CM | POA: Diagnosis not present

## 2014-07-05 DIAGNOSIS — R251 Tremor, unspecified: Secondary | ICD-10-CM | POA: Diagnosis not present

## 2014-07-05 DIAGNOSIS — R278 Other lack of coordination: Secondary | ICD-10-CM | POA: Diagnosis not present

## 2014-07-05 DIAGNOSIS — S32591D Other specified fracture of right pubis, subsequent encounter for fracture with routine healing: Secondary | ICD-10-CM | POA: Diagnosis not present

## 2014-07-05 DIAGNOSIS — M6281 Muscle weakness (generalized): Secondary | ICD-10-CM | POA: Diagnosis not present

## 2014-07-05 DIAGNOSIS — Z9181 History of falling: Secondary | ICD-10-CM | POA: Diagnosis not present

## 2014-07-05 DIAGNOSIS — R2681 Unsteadiness on feet: Secondary | ICD-10-CM | POA: Diagnosis not present

## 2014-07-05 NOTE — Progress Notes (Signed)
Patient ID: Yolanda Shaw, female   DOB: May 05, 1931, 81 y.o.   MRN: [id number]     Facility: [facility]    PCP: Riley Churches, MD  Code Status: full code  Allergies  Allergen Reactions  . Ether Other (See Comments)    Hard to wake up after procedure  . Penicillins Other (See Comments)    welps  . Amitriptyline Rash    Chief Complaint  Patient presents with  . New Admit To SNF     HPI:  81 year old patient is here for short term rehabilitation post hospital admission from 06/29/14-07/02/14 post fall with left superior and inferior pubic rami fracture. She was seen by orthopedics, no surgery performed and advised WBAT with pain medication. She is seen in her room today. She denies any concerns. Had a bowel movement this am. No concerns from staff  Review of Systems:  Constitutional: Negative for fever, chills, diaphoresis.  HENT: Negative for headache, congestion, nasal discharge Eyes: Negative for eye pain, blurred vision, double vision and discharge.  Respiratory: Negative for cough, shortness of breath and wheezing.   Cardiovascular: Negative for chest pain, palpitations, leg swelling.  Gastrointestinal: Negative for heartburn, nausea, vomiting, abdominal pain Genitourinary: Negative for dysuria  Musculoskeletal: Negative for back pain, falls Skin: Negative for itching, rash.  Neurological: Negative for dizziness, tingling, focal weakness Psychiatric/Behavioral: Negative for depression   No past medical history on file. Past Surgical History  Procedure Laterality Date  . Wrist surgery    . Tonsillectomy    . Adenoidectomy    . Shoulder surgery      right   Social History:   reports that she has quit smoking. She does not have any smokeless tobacco history on file. She reports that she drinks alcohol. She reports that she does not use illicit drugs.  No family history on file.  Medications: Patient's Medications  New Prescriptions   No medications on file  Previous Medications   ACETAMINOPHEN (TYLENOL) 500 MG TABLET    Take 2 tablets (1,000 mg total) by mouth every 8 (eight) hours as needed.   ACETAMINOPHEN (TYLENOL) 500 MG TABLET    Take 2 tablets (1,000 mg total) by mouth 3 (three) times daily as needed for mild pain.   ASCORBIC ACID (VITAMIN C PO)    Take 1 tablet by mouth daily.   CHOLECALCIFEROL (VITAMIN D PO)    Take 1 tablet by mouth daily.   MULTIPLE VITAMINS-MINERALS (MULTIVITAMIN ADULT PO)    Take 1 tablet by mouth daily.   OVER THE COUNTER MEDICATION    Take 1 tablet by mouth 3 (three) times daily. Cardio plus   OXYCODONE (OXY IR/ROXICODONE) 5 MG IMMEDIATE RELEASE TABLET    Take 1 tablet (5 mg total) by mouth every 6 (six) hours as needed for moderate pain.   POLYETHYLENE GLYCOL (MIRALAX / GLYCOLAX) PACKET    Take 17 g by mouth daily.  Modified Medications   No medications on file  Discontinued Medications   No medications on file     Physical Exam: Filed Vitals:   07/05/14 1836  BP: 121/76  Pulse: 83  Temp: 97.8 F (36.6 C)  Resp: 16  SpO2: 98%    General- elderly female, thin built, in no acute distress Head- normocephalic, atraumatic Throat- moist mucus membrane Eyes- PERRLA, EOMI, no pallor, no icterus, no discharge, normal conjunctiva, normal sclera Neck- no cervical lymphadenopathy Cardiovascular- normal s1,s2, no murmurs, palpable dorsalis pedis, no leg edema  Respiratory- bilateral clear to auscultation, no wheeze, no rhonchi, no crackles, no use of accessory muscles Abdomen- bowel sounds present, soft, non tender Musculoskeletal- able to move all 4 extremities, limited ROM at left hip.   Neurological- no focal deficit Skin- warm and dry Psychiatry- alert and oriented  Labs reviewed: Basic Metabolic Panel:  Recent Labs  16/10/9604/24/16 2136 06/30/14 0506  NA 139 136  K 3.8 3.5  CL 107 103  CO2 24 27  GLUCOSE 118* 142*  BUN 16 14  CREATININE 0.91 0.89  CALCIUM 9.2 8.5*    CBC:  Recent Labs  06/29/14 2136 06/30/14 0506  WBC 12.2* 7.4  HGB 13.2 12.2  HCT 39.6 36.6  MCV 94.1 93.8  PLT 135* 131*    Radiological Exams: 06/29/14 left hip xray IMPRESSION: Fractures of the left superior and inferior pubic rami.    Assessment/Plan  Left pubic rami fracture Will have her work with physical therapy and occupational therapy team to help with gait training and muscle strengthening exercises.fall precautions. Skin care. Encourage to be out of bed. WBAT. Continue oxyIR 5 mg 1 tab q6h prn pain with tylenol 500 mg 2 tab tid prn. Continue vitamin d supplement. Has follow up with orthopedics  [facility]ors [facility], has outpatient follow up  Constipation Stable, continue miralax daily with her on narcotics   Goals of care: short term rehabilitation   Labs/tests ordered: cbc with diff, cmp  Family/ staff Communication: reviewed care plan with patient and nursing supervisor    Oneal GroutMAHIMA Kaisen Ackers, MD  [facility] [phone] (Monday-Friday 8 am - 5 pm) [phone] (afterhours)

## 2014-07-06 DIAGNOSIS — R2681 Unsteadiness on feet: Secondary | ICD-10-CM | POA: Diagnosis not present

## 2014-07-06 DIAGNOSIS — S32591D Other specified fracture of right pubis, subsequent encounter for fracture with routine healing: Secondary | ICD-10-CM | POA: Diagnosis not present

## 2014-07-06 DIAGNOSIS — Z9181 History of falling: Secondary | ICD-10-CM | POA: Diagnosis not present

## 2014-07-06 DIAGNOSIS — S32592D Other specified fracture of left pubis, subsequent encounter for fracture with routine healing: Secondary | ICD-10-CM | POA: Diagnosis not present

## 2014-07-06 DIAGNOSIS — M6281 Muscle weakness (generalized): Secondary | ICD-10-CM | POA: Diagnosis not present

## 2014-07-08 DIAGNOSIS — Z9181 History of falling: Secondary | ICD-10-CM | POA: Diagnosis not present

## 2014-07-08 DIAGNOSIS — S32591D Other specified fracture of right pubis, subsequent encounter for fracture with routine healing: Secondary | ICD-10-CM | POA: Diagnosis not present

## 2014-07-08 DIAGNOSIS — S32592D Other specified fracture of left pubis, subsequent encounter for fracture with routine healing: Secondary | ICD-10-CM | POA: Diagnosis not present

## 2014-07-08 DIAGNOSIS — M6281 Muscle weakness (generalized): Secondary | ICD-10-CM | POA: Diagnosis not present

## 2014-07-08 DIAGNOSIS — R2681 Unsteadiness on feet: Secondary | ICD-10-CM | POA: Diagnosis not present

## 2014-07-09 DIAGNOSIS — S32592D Other specified fracture of left pubis, subsequent encounter for fracture with routine healing: Secondary | ICD-10-CM | POA: Diagnosis not present

## 2014-07-09 DIAGNOSIS — R2681 Unsteadiness on feet: Secondary | ICD-10-CM | POA: Diagnosis not present

## 2014-07-09 DIAGNOSIS — M6281 Muscle weakness (generalized): Secondary | ICD-10-CM | POA: Diagnosis not present

## 2014-07-09 DIAGNOSIS — S32591D Other specified fracture of right pubis, subsequent encounter for fracture with routine healing: Secondary | ICD-10-CM | POA: Diagnosis not present

## 2014-07-09 DIAGNOSIS — Z9181 History of falling: Secondary | ICD-10-CM | POA: Diagnosis not present

## 2014-07-10 DIAGNOSIS — R2681 Unsteadiness on feet: Secondary | ICD-10-CM | POA: Diagnosis not present

## 2014-07-10 DIAGNOSIS — Z9181 History of falling: Secondary | ICD-10-CM | POA: Diagnosis not present

## 2014-07-10 DIAGNOSIS — M6281 Muscle weakness (generalized): Secondary | ICD-10-CM | POA: Diagnosis not present

## 2014-07-10 DIAGNOSIS — S32591D Other specified fracture of right pubis, subsequent encounter for fracture with routine healing: Secondary | ICD-10-CM | POA: Diagnosis not present

## 2014-07-10 DIAGNOSIS — S32592D Other specified fracture of left pubis, subsequent encounter for fracture with routine healing: Secondary | ICD-10-CM | POA: Diagnosis not present

## 2014-07-11 ENCOUNTER — Encounter: Payer: Self-pay | Admitting: Nurse Practitioner

## 2014-07-11 ENCOUNTER — Non-Acute Institutional Stay (SKILLED_NURSING_FACILITY): Payer: Medicare Other | Admitting: Nurse Practitioner

## 2014-07-11 DIAGNOSIS — S32591D Other specified fracture of right pubis, subsequent encounter for fracture with routine healing: Secondary | ICD-10-CM | POA: Diagnosis not present

## 2014-07-11 DIAGNOSIS — M5410 Radiculopathy, site unspecified: Secondary | ICD-10-CM | POA: Diagnosis not present

## 2014-07-11 DIAGNOSIS — K5900 Constipation, unspecified: Secondary | ICD-10-CM

## 2014-07-11 DIAGNOSIS — Z9181 History of falling: Secondary | ICD-10-CM | POA: Diagnosis not present

## 2014-07-11 DIAGNOSIS — S32502S Unspecified fracture of left pubis, sequela: Secondary | ICD-10-CM

## 2014-07-11 DIAGNOSIS — M6281 Muscle weakness (generalized): Secondary | ICD-10-CM | POA: Diagnosis not present

## 2014-07-11 DIAGNOSIS — R2681 Unsteadiness on feet: Secondary | ICD-10-CM | POA: Diagnosis not present

## 2014-07-11 DIAGNOSIS — S32592D Other specified fracture of left pubis, subsequent encounter for fracture with routine healing: Secondary | ICD-10-CM | POA: Diagnosis not present

## 2014-07-11 DIAGNOSIS — M5418 Radiculopathy, sacral and sacrococcygeal region: Secondary | ICD-10-CM

## 2014-07-11 NOTE — Progress Notes (Signed)
Patient ID: Yolanda Shaw, female   DOB: 09/15/1931, 81 y.o.   MRN: [id number]    Nursing Home Location:  [facility]   Place of Service: SNF (31)  PCP: Riley ChurchesBRASHER, BRUCE U, MD  Allergies  Allergen Reactions  . Ether Other (See Comments)    Hard to wake up after procedure  . Penicillins Other (See Comments)    welps  . Amitriptyline Rash    Chief Complaint  Patient presents with  . Discharge Note    Discharge from SNF    HPI:  Patient is a 81 y.o. female seen today at [facility] for discharge home with Son. here for short term rehabilitation post hospital admission from 06/29/14-07/02/14 post fall with left superior and inferior pubic rami fracture. She was seen by orthopedics, no surgery performed and advised WBAT with pain medication. Family reported she sat down hard at home and Pt reports tail bone pain that has been ongoing since fracture. Patient currently doing well with therapy, now stable to discharge home with home health.  Review of Systems:  Review of Systems  Constitutional: Negative for activity change, appetite change, fatigue and unexpected weight change.  HENT: Negative for congestion and hearing loss.   Eyes: Negative.   Respiratory: Negative for cough and shortness of breath.   Cardiovascular: Negative for chest pain, palpitations and leg swelling.  Gastrointestinal: Negative for abdominal pain, diarrhea and constipation.  Genitourinary: Negative for dysuria and difficulty urinating.  Musculoskeletal: Positive for arthralgias (hip and tail bone pain). Negative for myalgias.  Skin: Negative for color change and wound.  Neurological: Negative for dizziness and weakness.  Psychiatric/Behavioral: Negative for behavioral problems, confusion and agitation.    History reviewed. No pertinent past medical history. Past Surgical History  Procedure Laterality Date  . Wrist surgery    . Tonsillectomy    . Adenoidectomy    . Shoulder  surgery      right   Social History:   reports that she has quit smoking. She does not have any smokeless tobacco history on file. She reports that she drinks alcohol. She reports that she does not use illicit drugs.  History reviewed. No pertinent family history.  Medications: Patient's Medications  New Prescriptions   No medications on file  Previous Medications   ACETAMINOPHEN (TYLENOL) 500 MG TABLET    Take 2 tablets (1,000 mg total) by mouth every 8 (eight) hours as needed.   ASCORBIC ACID (VITAMIN C PO)    Take 1 tablet by mouth daily. 500 mg   CHOLECALCIFEROL (VITAMIN D PO)    Take 1 tablet by mouth daily. 1000 iu   MULTIPLE VITAMINS-MINERALS (MULTIVITAMIN ADULT PO)    Take 1 tablet by mouth daily.   OVER THE COUNTER MEDICATION    Take 1 tablet by mouth 3 (three) times daily. Cardio plus   OXYCODONE (OXY IR/ROXICODONE) 5 MG IMMEDIATE RELEASE TABLET    Take 1 tablet (5 mg total) by mouth every 6 (six) hours as needed for moderate pain.   POLYETHYLENE GLYCOL (MIRALAX / GLYCOLAX) PACKET    Take 17 g by mouth daily.  Modified Medications   No medications on file  Discontinued Medications   ACETAMINOPHEN (TYLENOL) 500 MG TABLET    Take 2 tablets (1,000 mg total) by mouth 3 (three) times daily as needed for mild pain.     Physical Exam: Filed Vitals:   07/11/14 1111  BP: 119/71  Pulse: 80  Temp: 99.3 F (  37.4 C)  TempSrc: Oral  Resp: 20  Height:  (1.651 m)  Weight: 113 lb (51.256 kg)  SpO2: 96%    Physical Exam  Constitutional: She is oriented to person, place, and time. No distress.    Thin elderly female, NAD  HENT:  Head: Normocephalic and atraumatic.  Mouth/Throat: Oropharynx is clear and moist. No oropharyngeal exudate.  Eyes: Conjunctivae are normal. Pupils are equal, round, and reactive to light.  Neck: Normal range of motion. Neck supple.  Cardiovascular: Normal rate, regular rhythm and normal heart sounds.   Pulmonary/Chest: Effort normal and breath  sounds normal.  Abdominal: Soft. Bowel sounds are normal.  Musculoskeletal: She exhibits tenderness. She exhibits no edema.  Neurological: She is alert and oriented to person, place, and time.  Skin: Skin is warm and dry. She is not diaphoretic.  Psychiatric: She has a normal mood and affect.    Labs reviewed: Basic Metabolic Panel:  Recent Labs  43/32/95 2136 06/30/14 0506  NA 139 136  K 3.8 3.5  CL 107 103  CO2 24 27  GLUCOSE 118* 142*  BUN 16 14  CREATININE 0.91 0.89  CALCIUM 9.2 8.5*   Liver Function Tests: No results for input(s): AST, ALT, ALKPHOS, BILITOT, PROT, ALBUMIN in the last 8760 hours. No results for input(s): LIPASE, AMYLASE in the last 8760 hours. No results for input(s): AMMONIA in the last 8760 hours. CBC:  Recent Labs  06/29/14 2136 06/30/14 0506  WBC 12.2* 7.4  HGB 13.2 12.2  HCT 39.6 36.6  MCV 94.1 93.8  PLT 135* 131*   TSH: No results for input(s): TSH in the last 8760 hours. A1C: No results found for: HGBA1C Lipid Panel: No results for input(s): CHOL, HDL, LDLCALC, TRIG, CHOLHDL, LDLDIRECT in the last 8760 hours.   Assessment/Plan  1. Fracture of left pubis, sequela Medical management, has done well with rehab. -pain controlled with oxycodone. -cont vit d supplement -cont outpt follow up with ortho   2. Constipation, unspecified constipation type -controlled on current regimen   3. Radicular pain of sacrum -most likely due to pelvic fracture, cont oxycodone 5 mg as needed - to follow up with orthopedics   pt is stable for discharge-will need PT/OT per home health. DME needed includes FWW and 3-1. Rx written.  will need to follow up with PCP within 2 weeks.    Janene Harvey. Biagio Borg  [facility] [phone] 8 am - 5 pm) [phone] (after hours)

## 2014-07-14 DIAGNOSIS — S32591D Other specified fracture of right pubis, subsequent encounter for fracture with routine healing: Secondary | ICD-10-CM | POA: Diagnosis not present

## 2014-07-14 DIAGNOSIS — Z79891 Long term (current) use of opiate analgesic: Secondary | ICD-10-CM | POA: Diagnosis not present

## 2014-07-14 DIAGNOSIS — S32592D Other specified fracture of left pubis, subsequent encounter for fracture with routine healing: Secondary | ICD-10-CM | POA: Diagnosis not present

## 2014-07-14 DIAGNOSIS — Z9181 History of falling: Secondary | ICD-10-CM | POA: Diagnosis not present

## 2014-07-17 DIAGNOSIS — S32591D Other specified fracture of right pubis, subsequent encounter for fracture with routine healing: Secondary | ICD-10-CM | POA: Diagnosis not present

## 2014-07-17 DIAGNOSIS — S32592D Other specified fracture of left pubis, subsequent encounter for fracture with routine healing: Secondary | ICD-10-CM | POA: Diagnosis not present

## 2014-07-17 DIAGNOSIS — Z79891 Long term (current) use of opiate analgesic: Secondary | ICD-10-CM | POA: Diagnosis not present

## 2014-07-17 DIAGNOSIS — Z9181 History of falling: Secondary | ICD-10-CM | POA: Diagnosis not present

## 2014-07-18 DIAGNOSIS — S32591D Other specified fracture of right pubis, subsequent encounter for fracture with routine healing: Secondary | ICD-10-CM | POA: Diagnosis not present

## 2014-07-18 DIAGNOSIS — Z9181 History of falling: Secondary | ICD-10-CM | POA: Diagnosis not present

## 2014-07-18 DIAGNOSIS — Z79891 Long term (current) use of opiate analgesic: Secondary | ICD-10-CM | POA: Diagnosis not present

## 2014-07-18 DIAGNOSIS — S32592D Other specified fracture of left pubis, subsequent encounter for fracture with routine healing: Secondary | ICD-10-CM | POA: Diagnosis not present

## 2014-07-18 DIAGNOSIS — S32512D Fracture of superior rim of left pubis, subsequent encounter for fracture with routine healing: Secondary | ICD-10-CM | POA: Diagnosis not present

## 2014-07-19 DIAGNOSIS — Z9181 History of falling: Secondary | ICD-10-CM | POA: Diagnosis not present

## 2014-07-19 DIAGNOSIS — S32591D Other specified fracture of right pubis, subsequent encounter for fracture with routine healing: Secondary | ICD-10-CM | POA: Diagnosis not present

## 2014-07-19 DIAGNOSIS — Z79891 Long term (current) use of opiate analgesic: Secondary | ICD-10-CM | POA: Diagnosis not present

## 2014-07-19 DIAGNOSIS — S32592D Other specified fracture of left pubis, subsequent encounter for fracture with routine healing: Secondary | ICD-10-CM | POA: Diagnosis not present

## 2014-07-20 DIAGNOSIS — S32592D Other specified fracture of left pubis, subsequent encounter for fracture with routine healing: Secondary | ICD-10-CM | POA: Diagnosis not present

## 2014-07-20 DIAGNOSIS — Z9181 History of falling: Secondary | ICD-10-CM | POA: Diagnosis not present

## 2014-07-20 DIAGNOSIS — Z79891 Long term (current) use of opiate analgesic: Secondary | ICD-10-CM | POA: Diagnosis not present

## 2014-07-20 DIAGNOSIS — S32591D Other specified fracture of right pubis, subsequent encounter for fracture with routine healing: Secondary | ICD-10-CM | POA: Diagnosis not present

## 2014-07-23 DIAGNOSIS — Z9181 History of falling: Secondary | ICD-10-CM | POA: Diagnosis not present

## 2014-07-23 DIAGNOSIS — Z79891 Long term (current) use of opiate analgesic: Secondary | ICD-10-CM | POA: Diagnosis not present

## 2014-07-23 DIAGNOSIS — S32592D Other specified fracture of left pubis, subsequent encounter for fracture with routine healing: Secondary | ICD-10-CM | POA: Diagnosis not present

## 2014-07-23 DIAGNOSIS — S32591D Other specified fracture of right pubis, subsequent encounter for fracture with routine healing: Secondary | ICD-10-CM | POA: Diagnosis not present

## 2014-07-24 DIAGNOSIS — S32592D Other specified fracture of left pubis, subsequent encounter for fracture with routine healing: Secondary | ICD-10-CM | POA: Diagnosis not present

## 2014-07-24 DIAGNOSIS — S32591D Other specified fracture of right pubis, subsequent encounter for fracture with routine healing: Secondary | ICD-10-CM | POA: Diagnosis not present

## 2014-07-24 DIAGNOSIS — Z79891 Long term (current) use of opiate analgesic: Secondary | ICD-10-CM | POA: Diagnosis not present

## 2014-07-24 DIAGNOSIS — Z9181 History of falling: Secondary | ICD-10-CM | POA: Diagnosis not present

## 2014-07-25 DIAGNOSIS — S32512S Fracture of superior rim of left pubis, sequela: Secondary | ICD-10-CM | POA: Diagnosis not present

## 2014-07-26 DIAGNOSIS — Z9181 History of falling: Secondary | ICD-10-CM | POA: Diagnosis not present

## 2014-07-26 DIAGNOSIS — S32591D Other specified fracture of right pubis, subsequent encounter for fracture with routine healing: Secondary | ICD-10-CM | POA: Diagnosis not present

## 2014-07-26 DIAGNOSIS — Z79891 Long term (current) use of opiate analgesic: Secondary | ICD-10-CM | POA: Diagnosis not present

## 2014-07-26 DIAGNOSIS — S32592D Other specified fracture of left pubis, subsequent encounter for fracture with routine healing: Secondary | ICD-10-CM | POA: Diagnosis not present

## 2014-07-27 DIAGNOSIS — Z79891 Long term (current) use of opiate analgesic: Secondary | ICD-10-CM | POA: Diagnosis not present

## 2014-07-27 DIAGNOSIS — S32591D Other specified fracture of right pubis, subsequent encounter for fracture with routine healing: Secondary | ICD-10-CM | POA: Diagnosis not present

## 2014-07-27 DIAGNOSIS — Z9181 History of falling: Secondary | ICD-10-CM | POA: Diagnosis not present

## 2014-07-27 DIAGNOSIS — S32592D Other specified fracture of left pubis, subsequent encounter for fracture with routine healing: Secondary | ICD-10-CM | POA: Diagnosis not present

## 2014-07-30 DIAGNOSIS — Z79891 Long term (current) use of opiate analgesic: Secondary | ICD-10-CM | POA: Diagnosis not present

## 2014-07-30 DIAGNOSIS — S32592D Other specified fracture of left pubis, subsequent encounter for fracture with routine healing: Secondary | ICD-10-CM | POA: Diagnosis not present

## 2014-07-30 DIAGNOSIS — Z9181 History of falling: Secondary | ICD-10-CM | POA: Diagnosis not present

## 2014-07-30 DIAGNOSIS — S32591D Other specified fracture of right pubis, subsequent encounter for fracture with routine healing: Secondary | ICD-10-CM | POA: Diagnosis not present

## 2014-07-31 DIAGNOSIS — S32591D Other specified fracture of right pubis, subsequent encounter for fracture with routine healing: Secondary | ICD-10-CM | POA: Diagnosis not present

## 2014-07-31 DIAGNOSIS — S32592D Other specified fracture of left pubis, subsequent encounter for fracture with routine healing: Secondary | ICD-10-CM | POA: Diagnosis not present

## 2014-07-31 DIAGNOSIS — Z9181 History of falling: Secondary | ICD-10-CM | POA: Diagnosis not present

## 2014-07-31 DIAGNOSIS — Z79891 Long term (current) use of opiate analgesic: Secondary | ICD-10-CM | POA: Diagnosis not present

## 2014-08-02 DIAGNOSIS — Z79891 Long term (current) use of opiate analgesic: Secondary | ICD-10-CM | POA: Diagnosis not present

## 2014-08-02 DIAGNOSIS — S32592D Other specified fracture of left pubis, subsequent encounter for fracture with routine healing: Secondary | ICD-10-CM | POA: Diagnosis not present

## 2014-08-02 DIAGNOSIS — S32591D Other specified fracture of right pubis, subsequent encounter for fracture with routine healing: Secondary | ICD-10-CM | POA: Diagnosis not present

## 2014-08-02 DIAGNOSIS — Z9181 History of falling: Secondary | ICD-10-CM | POA: Diagnosis not present

## 2014-08-03 DIAGNOSIS — S32592D Other specified fracture of left pubis, subsequent encounter for fracture with routine healing: Secondary | ICD-10-CM | POA: Diagnosis not present

## 2014-08-03 DIAGNOSIS — Z79891 Long term (current) use of opiate analgesic: Secondary | ICD-10-CM | POA: Diagnosis not present

## 2014-08-03 DIAGNOSIS — S32591D Other specified fracture of right pubis, subsequent encounter for fracture with routine healing: Secondary | ICD-10-CM | POA: Diagnosis not present

## 2014-08-03 DIAGNOSIS — Z9181 History of falling: Secondary | ICD-10-CM | POA: Diagnosis not present

## 2014-08-06 DIAGNOSIS — S32591D Other specified fracture of right pubis, subsequent encounter for fracture with routine healing: Secondary | ICD-10-CM | POA: Diagnosis not present

## 2014-08-06 DIAGNOSIS — Z79891 Long term (current) use of opiate analgesic: Secondary | ICD-10-CM | POA: Diagnosis not present

## 2014-08-06 DIAGNOSIS — Z9181 History of falling: Secondary | ICD-10-CM | POA: Diagnosis not present

## 2014-08-06 DIAGNOSIS — S32592D Other specified fracture of left pubis, subsequent encounter for fracture with routine healing: Secondary | ICD-10-CM | POA: Diagnosis not present

## 2014-08-07 DIAGNOSIS — S0990XA Unspecified injury of head, initial encounter: Secondary | ICD-10-CM | POA: Diagnosis not present

## 2014-08-07 DIAGNOSIS — R269 Unspecified abnormalities of gait and mobility: Secondary | ICD-10-CM | POA: Diagnosis not present

## 2014-08-07 DIAGNOSIS — R410 Disorientation, unspecified: Secondary | ICD-10-CM | POA: Diagnosis not present

## 2014-08-07 DIAGNOSIS — R413 Other amnesia: Secondary | ICD-10-CM | POA: Diagnosis not present

## 2014-08-07 DIAGNOSIS — R251 Tremor, unspecified: Secondary | ICD-10-CM | POA: Diagnosis not present

## 2014-08-07 DIAGNOSIS — G319 Degenerative disease of nervous system, unspecified: Secondary | ICD-10-CM | POA: Diagnosis not present

## 2014-08-08 DIAGNOSIS — S32591D Other specified fracture of right pubis, subsequent encounter for fracture with routine healing: Secondary | ICD-10-CM | POA: Diagnosis not present

## 2014-08-08 DIAGNOSIS — Z9181 History of falling: Secondary | ICD-10-CM | POA: Diagnosis not present

## 2014-08-08 DIAGNOSIS — S32592D Other specified fracture of left pubis, subsequent encounter for fracture with routine healing: Secondary | ICD-10-CM | POA: Diagnosis not present

## 2014-08-08 DIAGNOSIS — Z79891 Long term (current) use of opiate analgesic: Secondary | ICD-10-CM | POA: Diagnosis not present

## 2014-08-09 DIAGNOSIS — S32592D Other specified fracture of left pubis, subsequent encounter for fracture with routine healing: Secondary | ICD-10-CM | POA: Diagnosis not present

## 2014-08-09 DIAGNOSIS — Z79891 Long term (current) use of opiate analgesic: Secondary | ICD-10-CM | POA: Diagnosis not present

## 2014-08-09 DIAGNOSIS — Z9181 History of falling: Secondary | ICD-10-CM | POA: Diagnosis not present

## 2014-08-09 DIAGNOSIS — S32591D Other specified fracture of right pubis, subsequent encounter for fracture with routine healing: Secondary | ICD-10-CM | POA: Diagnosis not present

## 2014-08-10 DIAGNOSIS — S32591D Other specified fracture of right pubis, subsequent encounter for fracture with routine healing: Secondary | ICD-10-CM | POA: Diagnosis not present

## 2014-08-10 DIAGNOSIS — Z9181 History of falling: Secondary | ICD-10-CM | POA: Diagnosis not present

## 2014-08-10 DIAGNOSIS — Z79891 Long term (current) use of opiate analgesic: Secondary | ICD-10-CM | POA: Diagnosis not present

## 2014-08-10 DIAGNOSIS — S32592D Other specified fracture of left pubis, subsequent encounter for fracture with routine healing: Secondary | ICD-10-CM | POA: Diagnosis not present

## 2014-08-13 DIAGNOSIS — Z9181 History of falling: Secondary | ICD-10-CM | POA: Diagnosis not present

## 2014-08-13 DIAGNOSIS — S32591D Other specified fracture of right pubis, subsequent encounter for fracture with routine healing: Secondary | ICD-10-CM | POA: Diagnosis not present

## 2014-08-13 DIAGNOSIS — Z79891 Long term (current) use of opiate analgesic: Secondary | ICD-10-CM | POA: Diagnosis not present

## 2014-08-13 DIAGNOSIS — S32592D Other specified fracture of left pubis, subsequent encounter for fracture with routine healing: Secondary | ICD-10-CM | POA: Diagnosis not present

## 2014-08-14 DIAGNOSIS — Z9181 History of falling: Secondary | ICD-10-CM | POA: Diagnosis not present

## 2014-08-14 DIAGNOSIS — S32512D Fracture of superior rim of left pubis, subsequent encounter for fracture with routine healing: Secondary | ICD-10-CM | POA: Diagnosis not present

## 2014-08-14 DIAGNOSIS — Z79891 Long term (current) use of opiate analgesic: Secondary | ICD-10-CM | POA: Diagnosis not present

## 2014-08-14 DIAGNOSIS — S32592D Other specified fracture of left pubis, subsequent encounter for fracture with routine healing: Secondary | ICD-10-CM | POA: Diagnosis not present

## 2014-08-14 DIAGNOSIS — S32591D Other specified fracture of right pubis, subsequent encounter for fracture with routine healing: Secondary | ICD-10-CM | POA: Diagnosis not present

## 2014-08-15 DIAGNOSIS — L89329 Pressure ulcer of left buttock, unspecified stage: Secondary | ICD-10-CM | POA: Diagnosis not present

## 2014-08-16 DIAGNOSIS — Z9181 History of falling: Secondary | ICD-10-CM | POA: Diagnosis not present

## 2014-08-16 DIAGNOSIS — Z79891 Long term (current) use of opiate analgesic: Secondary | ICD-10-CM | POA: Diagnosis not present

## 2014-08-16 DIAGNOSIS — S32592D Other specified fracture of left pubis, subsequent encounter for fracture with routine healing: Secondary | ICD-10-CM | POA: Diagnosis not present

## 2014-08-16 DIAGNOSIS — S32591D Other specified fracture of right pubis, subsequent encounter for fracture with routine healing: Secondary | ICD-10-CM | POA: Diagnosis not present

## 2014-08-17 DIAGNOSIS — S32592D Other specified fracture of left pubis, subsequent encounter for fracture with routine healing: Secondary | ICD-10-CM | POA: Diagnosis not present

## 2014-08-17 DIAGNOSIS — Z9181 History of falling: Secondary | ICD-10-CM | POA: Diagnosis not present

## 2014-08-17 DIAGNOSIS — S32591D Other specified fracture of right pubis, subsequent encounter for fracture with routine healing: Secondary | ICD-10-CM | POA: Diagnosis not present

## 2014-08-17 DIAGNOSIS — Z79891 Long term (current) use of opiate analgesic: Secondary | ICD-10-CM | POA: Diagnosis not present

## 2014-08-21 DIAGNOSIS — Z79891 Long term (current) use of opiate analgesic: Secondary | ICD-10-CM | POA: Diagnosis not present

## 2014-08-21 DIAGNOSIS — S32592D Other specified fracture of left pubis, subsequent encounter for fracture with routine healing: Secondary | ICD-10-CM | POA: Diagnosis not present

## 2014-08-21 DIAGNOSIS — S32591D Other specified fracture of right pubis, subsequent encounter for fracture with routine healing: Secondary | ICD-10-CM | POA: Diagnosis not present

## 2014-08-21 DIAGNOSIS — Z9181 History of falling: Secondary | ICD-10-CM | POA: Diagnosis not present

## 2014-08-22 DIAGNOSIS — S32592D Other specified fracture of left pubis, subsequent encounter for fracture with routine healing: Secondary | ICD-10-CM | POA: Diagnosis not present

## 2014-08-22 DIAGNOSIS — Z9181 History of falling: Secondary | ICD-10-CM | POA: Diagnosis not present

## 2014-08-22 DIAGNOSIS — S32591D Other specified fracture of right pubis, subsequent encounter for fracture with routine healing: Secondary | ICD-10-CM | POA: Diagnosis not present

## 2014-08-22 DIAGNOSIS — Z79891 Long term (current) use of opiate analgesic: Secondary | ICD-10-CM | POA: Diagnosis not present

## 2014-08-23 DIAGNOSIS — S32592D Other specified fracture of left pubis, subsequent encounter for fracture with routine healing: Secondary | ICD-10-CM | POA: Diagnosis not present

## 2014-08-23 DIAGNOSIS — Z9181 History of falling: Secondary | ICD-10-CM | POA: Diagnosis not present

## 2014-08-23 DIAGNOSIS — S32591D Other specified fracture of right pubis, subsequent encounter for fracture with routine healing: Secondary | ICD-10-CM | POA: Diagnosis not present

## 2014-08-23 DIAGNOSIS — Z79891 Long term (current) use of opiate analgesic: Secondary | ICD-10-CM | POA: Diagnosis not present

## 2014-08-24 DIAGNOSIS — Z79891 Long term (current) use of opiate analgesic: Secondary | ICD-10-CM | POA: Diagnosis not present

## 2014-08-24 DIAGNOSIS — S32592D Other specified fracture of left pubis, subsequent encounter for fracture with routine healing: Secondary | ICD-10-CM | POA: Diagnosis not present

## 2014-08-24 DIAGNOSIS — S32591D Other specified fracture of right pubis, subsequent encounter for fracture with routine healing: Secondary | ICD-10-CM | POA: Diagnosis not present

## 2014-08-24 DIAGNOSIS — Z9181 History of falling: Secondary | ICD-10-CM | POA: Diagnosis not present

## 2014-08-28 DIAGNOSIS — S32592D Other specified fracture of left pubis, subsequent encounter for fracture with routine healing: Secondary | ICD-10-CM | POA: Diagnosis not present

## 2014-08-28 DIAGNOSIS — Z9181 History of falling: Secondary | ICD-10-CM | POA: Diagnosis not present

## 2014-08-28 DIAGNOSIS — S32591D Other specified fracture of right pubis, subsequent encounter for fracture with routine healing: Secondary | ICD-10-CM | POA: Diagnosis not present

## 2014-08-28 DIAGNOSIS — Z79891 Long term (current) use of opiate analgesic: Secondary | ICD-10-CM | POA: Diagnosis not present

## 2014-08-29 DIAGNOSIS — S32592D Other specified fracture of left pubis, subsequent encounter for fracture with routine healing: Secondary | ICD-10-CM | POA: Diagnosis not present

## 2014-08-29 DIAGNOSIS — Z79891 Long term (current) use of opiate analgesic: Secondary | ICD-10-CM | POA: Diagnosis not present

## 2014-08-29 DIAGNOSIS — Z9181 History of falling: Secondary | ICD-10-CM | POA: Diagnosis not present

## 2014-08-29 DIAGNOSIS — S32591D Other specified fracture of right pubis, subsequent encounter for fracture with routine healing: Secondary | ICD-10-CM | POA: Diagnosis not present

## 2014-08-30 DIAGNOSIS — S32591D Other specified fracture of right pubis, subsequent encounter for fracture with routine healing: Secondary | ICD-10-CM | POA: Diagnosis not present

## 2014-08-30 DIAGNOSIS — Z79891 Long term (current) use of opiate analgesic: Secondary | ICD-10-CM | POA: Diagnosis not present

## 2014-08-30 DIAGNOSIS — Z9181 History of falling: Secondary | ICD-10-CM | POA: Diagnosis not present

## 2014-08-30 DIAGNOSIS — S32592D Other specified fracture of left pubis, subsequent encounter for fracture with routine healing: Secondary | ICD-10-CM | POA: Diagnosis not present

## 2014-08-31 DIAGNOSIS — Z79891 Long term (current) use of opiate analgesic: Secondary | ICD-10-CM | POA: Diagnosis not present

## 2014-08-31 DIAGNOSIS — S32591D Other specified fracture of right pubis, subsequent encounter for fracture with routine healing: Secondary | ICD-10-CM | POA: Diagnosis not present

## 2014-08-31 DIAGNOSIS — Z9181 History of falling: Secondary | ICD-10-CM | POA: Diagnosis not present

## 2014-08-31 DIAGNOSIS — S32592D Other specified fracture of left pubis, subsequent encounter for fracture with routine healing: Secondary | ICD-10-CM | POA: Diagnosis not present

## 2014-09-04 DIAGNOSIS — S32591D Other specified fracture of right pubis, subsequent encounter for fracture with routine healing: Secondary | ICD-10-CM | POA: Diagnosis not present

## 2014-09-04 DIAGNOSIS — Z79891 Long term (current) use of opiate analgesic: Secondary | ICD-10-CM | POA: Diagnosis not present

## 2014-09-04 DIAGNOSIS — Z9181 History of falling: Secondary | ICD-10-CM | POA: Diagnosis not present

## 2014-09-04 DIAGNOSIS — S32592D Other specified fracture of left pubis, subsequent encounter for fracture with routine healing: Secondary | ICD-10-CM | POA: Diagnosis not present

## 2014-09-07 DIAGNOSIS — Z9181 History of falling: Secondary | ICD-10-CM | POA: Diagnosis not present

## 2014-09-07 DIAGNOSIS — S32591D Other specified fracture of right pubis, subsequent encounter for fracture with routine healing: Secondary | ICD-10-CM | POA: Diagnosis not present

## 2014-09-07 DIAGNOSIS — Z79891 Long term (current) use of opiate analgesic: Secondary | ICD-10-CM | POA: Diagnosis not present

## 2014-09-07 DIAGNOSIS — S32592D Other specified fracture of left pubis, subsequent encounter for fracture with routine healing: Secondary | ICD-10-CM | POA: Diagnosis not present

## 2014-09-12 DIAGNOSIS — R412 Retrograde amnesia: Secondary | ICD-10-CM | POA: Diagnosis not present

## 2014-09-12 DIAGNOSIS — Z79891 Long term (current) use of opiate analgesic: Secondary | ICD-10-CM | POA: Diagnosis not present

## 2014-09-12 DIAGNOSIS — Z9181 History of falling: Secondary | ICD-10-CM | POA: Diagnosis not present

## 2014-09-12 DIAGNOSIS — S32591D Other specified fracture of right pubis, subsequent encounter for fracture with routine healing: Secondary | ICD-10-CM | POA: Diagnosis not present

## 2014-09-12 DIAGNOSIS — S32592D Other specified fracture of left pubis, subsequent encounter for fracture with routine healing: Secondary | ICD-10-CM | POA: Diagnosis not present

## 2014-09-14 DIAGNOSIS — S32592D Other specified fracture of left pubis, subsequent encounter for fracture with routine healing: Secondary | ICD-10-CM | POA: Diagnosis not present

## 2014-09-14 DIAGNOSIS — Z9181 History of falling: Secondary | ICD-10-CM | POA: Diagnosis not present

## 2014-09-14 DIAGNOSIS — S32591D Other specified fracture of right pubis, subsequent encounter for fracture with routine healing: Secondary | ICD-10-CM | POA: Diagnosis not present

## 2014-09-14 DIAGNOSIS — Z79891 Long term (current) use of opiate analgesic: Secondary | ICD-10-CM | POA: Diagnosis not present

## 2014-09-14 DIAGNOSIS — R412 Retrograde amnesia: Secondary | ICD-10-CM | POA: Diagnosis not present

## 2014-09-17 DIAGNOSIS — R412 Retrograde amnesia: Secondary | ICD-10-CM | POA: Diagnosis not present

## 2014-09-17 DIAGNOSIS — S32592D Other specified fracture of left pubis, subsequent encounter for fracture with routine healing: Secondary | ICD-10-CM | POA: Diagnosis not present

## 2014-09-17 DIAGNOSIS — S32591D Other specified fracture of right pubis, subsequent encounter for fracture with routine healing: Secondary | ICD-10-CM | POA: Diagnosis not present

## 2014-09-17 DIAGNOSIS — Z79891 Long term (current) use of opiate analgesic: Secondary | ICD-10-CM | POA: Diagnosis not present

## 2014-09-17 DIAGNOSIS — Z9181 History of falling: Secondary | ICD-10-CM | POA: Diagnosis not present

## 2014-09-19 DIAGNOSIS — Z9181 History of falling: Secondary | ICD-10-CM | POA: Diagnosis not present

## 2014-09-19 DIAGNOSIS — S32591D Other specified fracture of right pubis, subsequent encounter for fracture with routine healing: Secondary | ICD-10-CM | POA: Diagnosis not present

## 2014-09-19 DIAGNOSIS — Z79891 Long term (current) use of opiate analgesic: Secondary | ICD-10-CM | POA: Diagnosis not present

## 2014-09-19 DIAGNOSIS — R412 Retrograde amnesia: Secondary | ICD-10-CM | POA: Diagnosis not present

## 2014-09-19 DIAGNOSIS — S32592D Other specified fracture of left pubis, subsequent encounter for fracture with routine healing: Secondary | ICD-10-CM | POA: Diagnosis not present

## 2014-09-20 DIAGNOSIS — S32592D Other specified fracture of left pubis, subsequent encounter for fracture with routine healing: Secondary | ICD-10-CM | POA: Diagnosis not present

## 2014-09-20 DIAGNOSIS — S32591D Other specified fracture of right pubis, subsequent encounter for fracture with routine healing: Secondary | ICD-10-CM | POA: Diagnosis not present

## 2014-09-20 DIAGNOSIS — S32512S Fracture of superior rim of left pubis, sequela: Secondary | ICD-10-CM | POA: Diagnosis not present

## 2014-09-20 DIAGNOSIS — S32512D Fracture of superior rim of left pubis, subsequent encounter for fracture with routine healing: Secondary | ICD-10-CM | POA: Diagnosis not present

## 2014-09-20 DIAGNOSIS — R412 Retrograde amnesia: Secondary | ICD-10-CM | POA: Diagnosis not present

## 2014-09-20 DIAGNOSIS — Z79891 Long term (current) use of opiate analgesic: Secondary | ICD-10-CM | POA: Diagnosis not present

## 2014-09-20 DIAGNOSIS — S32512A Fracture of superior rim of left pubis, initial encounter for closed fracture: Secondary | ICD-10-CM | POA: Diagnosis not present

## 2014-09-20 DIAGNOSIS — Z9181 History of falling: Secondary | ICD-10-CM | POA: Diagnosis not present

## 2014-09-21 DIAGNOSIS — S32591D Other specified fracture of right pubis, subsequent encounter for fracture with routine healing: Secondary | ICD-10-CM | POA: Diagnosis not present

## 2014-09-21 DIAGNOSIS — Z79891 Long term (current) use of opiate analgesic: Secondary | ICD-10-CM | POA: Diagnosis not present

## 2014-09-21 DIAGNOSIS — Z9181 History of falling: Secondary | ICD-10-CM | POA: Diagnosis not present

## 2014-09-21 DIAGNOSIS — S32592D Other specified fracture of left pubis, subsequent encounter for fracture with routine healing: Secondary | ICD-10-CM | POA: Diagnosis not present

## 2014-09-21 DIAGNOSIS — R412 Retrograde amnesia: Secondary | ICD-10-CM | POA: Diagnosis not present

## 2014-09-24 DIAGNOSIS — Z79891 Long term (current) use of opiate analgesic: Secondary | ICD-10-CM | POA: Diagnosis not present

## 2014-09-24 DIAGNOSIS — S32592D Other specified fracture of left pubis, subsequent encounter for fracture with routine healing: Secondary | ICD-10-CM | POA: Diagnosis not present

## 2014-09-24 DIAGNOSIS — R412 Retrograde amnesia: Secondary | ICD-10-CM | POA: Diagnosis not present

## 2014-09-24 DIAGNOSIS — Z9181 History of falling: Secondary | ICD-10-CM | POA: Diagnosis not present

## 2014-09-24 DIAGNOSIS — S32591D Other specified fracture of right pubis, subsequent encounter for fracture with routine healing: Secondary | ICD-10-CM | POA: Diagnosis not present

## 2014-09-25 DIAGNOSIS — R412 Retrograde amnesia: Secondary | ICD-10-CM | POA: Diagnosis not present

## 2014-09-25 DIAGNOSIS — S32591D Other specified fracture of right pubis, subsequent encounter for fracture with routine healing: Secondary | ICD-10-CM | POA: Diagnosis not present

## 2014-09-25 DIAGNOSIS — Z79891 Long term (current) use of opiate analgesic: Secondary | ICD-10-CM | POA: Diagnosis not present

## 2014-09-25 DIAGNOSIS — S32592D Other specified fracture of left pubis, subsequent encounter for fracture with routine healing: Secondary | ICD-10-CM | POA: Diagnosis not present

## 2014-09-25 DIAGNOSIS — Z9181 History of falling: Secondary | ICD-10-CM | POA: Diagnosis not present

## 2014-09-27 DIAGNOSIS — S32591D Other specified fracture of right pubis, subsequent encounter for fracture with routine healing: Secondary | ICD-10-CM | POA: Diagnosis not present

## 2014-09-27 DIAGNOSIS — Z9181 History of falling: Secondary | ICD-10-CM | POA: Diagnosis not present

## 2014-09-27 DIAGNOSIS — R412 Retrograde amnesia: Secondary | ICD-10-CM | POA: Diagnosis not present

## 2014-09-27 DIAGNOSIS — Z79891 Long term (current) use of opiate analgesic: Secondary | ICD-10-CM | POA: Diagnosis not present

## 2014-09-27 DIAGNOSIS — S32592D Other specified fracture of left pubis, subsequent encounter for fracture with routine healing: Secondary | ICD-10-CM | POA: Diagnosis not present

## 2014-09-28 DIAGNOSIS — S32591D Other specified fracture of right pubis, subsequent encounter for fracture with routine healing: Secondary | ICD-10-CM | POA: Diagnosis not present

## 2014-09-28 DIAGNOSIS — S32592D Other specified fracture of left pubis, subsequent encounter for fracture with routine healing: Secondary | ICD-10-CM | POA: Diagnosis not present

## 2014-09-28 DIAGNOSIS — Z9181 History of falling: Secondary | ICD-10-CM | POA: Diagnosis not present

## 2014-09-28 DIAGNOSIS — R412 Retrograde amnesia: Secondary | ICD-10-CM | POA: Diagnosis not present

## 2014-09-28 DIAGNOSIS — Z79891 Long term (current) use of opiate analgesic: Secondary | ICD-10-CM | POA: Diagnosis not present

## 2014-10-01 DIAGNOSIS — R412 Retrograde amnesia: Secondary | ICD-10-CM | POA: Diagnosis not present

## 2014-10-01 DIAGNOSIS — S32591D Other specified fracture of right pubis, subsequent encounter for fracture with routine healing: Secondary | ICD-10-CM | POA: Diagnosis not present

## 2014-10-01 DIAGNOSIS — Z79891 Long term (current) use of opiate analgesic: Secondary | ICD-10-CM | POA: Diagnosis not present

## 2014-10-01 DIAGNOSIS — S32592D Other specified fracture of left pubis, subsequent encounter for fracture with routine healing: Secondary | ICD-10-CM | POA: Diagnosis not present

## 2014-10-01 DIAGNOSIS — Z9181 History of falling: Secondary | ICD-10-CM | POA: Diagnosis not present

## 2014-10-02 DIAGNOSIS — S32591D Other specified fracture of right pubis, subsequent encounter for fracture with routine healing: Secondary | ICD-10-CM | POA: Diagnosis not present

## 2014-10-02 DIAGNOSIS — R412 Retrograde amnesia: Secondary | ICD-10-CM | POA: Diagnosis not present

## 2014-10-02 DIAGNOSIS — Z9181 History of falling: Secondary | ICD-10-CM | POA: Diagnosis not present

## 2014-10-02 DIAGNOSIS — S32592D Other specified fracture of left pubis, subsequent encounter for fracture with routine healing: Secondary | ICD-10-CM | POA: Diagnosis not present

## 2014-10-02 DIAGNOSIS — Z79891 Long term (current) use of opiate analgesic: Secondary | ICD-10-CM | POA: Diagnosis not present

## 2014-10-03 DIAGNOSIS — Z9181 History of falling: Secondary | ICD-10-CM | POA: Diagnosis not present

## 2014-10-03 DIAGNOSIS — Z79891 Long term (current) use of opiate analgesic: Secondary | ICD-10-CM | POA: Diagnosis not present

## 2014-10-03 DIAGNOSIS — S32591D Other specified fracture of right pubis, subsequent encounter for fracture with routine healing: Secondary | ICD-10-CM | POA: Diagnosis not present

## 2014-10-03 DIAGNOSIS — S32592D Other specified fracture of left pubis, subsequent encounter for fracture with routine healing: Secondary | ICD-10-CM | POA: Diagnosis not present

## 2014-10-03 DIAGNOSIS — R412 Retrograde amnesia: Secondary | ICD-10-CM | POA: Diagnosis not present

## 2014-10-04 DIAGNOSIS — Z79891 Long term (current) use of opiate analgesic: Secondary | ICD-10-CM | POA: Diagnosis not present

## 2014-10-04 DIAGNOSIS — S32591D Other specified fracture of right pubis, subsequent encounter for fracture with routine healing: Secondary | ICD-10-CM | POA: Diagnosis not present

## 2014-10-04 DIAGNOSIS — R412 Retrograde amnesia: Secondary | ICD-10-CM | POA: Diagnosis not present

## 2014-10-04 DIAGNOSIS — S32592D Other specified fracture of left pubis, subsequent encounter for fracture with routine healing: Secondary | ICD-10-CM | POA: Diagnosis not present

## 2014-10-04 DIAGNOSIS — Z9181 History of falling: Secondary | ICD-10-CM | POA: Diagnosis not present

## 2014-10-05 DIAGNOSIS — R412 Retrograde amnesia: Secondary | ICD-10-CM | POA: Diagnosis not present

## 2014-10-05 DIAGNOSIS — S32591D Other specified fracture of right pubis, subsequent encounter for fracture with routine healing: Secondary | ICD-10-CM | POA: Diagnosis not present

## 2014-10-05 DIAGNOSIS — Z9181 History of falling: Secondary | ICD-10-CM | POA: Diagnosis not present

## 2014-10-05 DIAGNOSIS — S32592D Other specified fracture of left pubis, subsequent encounter for fracture with routine healing: Secondary | ICD-10-CM | POA: Diagnosis not present

## 2014-10-05 DIAGNOSIS — Z79891 Long term (current) use of opiate analgesic: Secondary | ICD-10-CM | POA: Diagnosis not present

## 2014-10-08 DIAGNOSIS — R412 Retrograde amnesia: Secondary | ICD-10-CM | POA: Diagnosis not present

## 2014-10-08 DIAGNOSIS — S32592D Other specified fracture of left pubis, subsequent encounter for fracture with routine healing: Secondary | ICD-10-CM | POA: Diagnosis not present

## 2014-10-08 DIAGNOSIS — Z79891 Long term (current) use of opiate analgesic: Secondary | ICD-10-CM | POA: Diagnosis not present

## 2014-10-08 DIAGNOSIS — S32591D Other specified fracture of right pubis, subsequent encounter for fracture with routine healing: Secondary | ICD-10-CM | POA: Diagnosis not present

## 2014-10-08 DIAGNOSIS — Z9181 History of falling: Secondary | ICD-10-CM | POA: Diagnosis not present

## 2014-10-09 DIAGNOSIS — R413 Other amnesia: Secondary | ICD-10-CM | POA: Diagnosis not present

## 2014-10-09 DIAGNOSIS — Z79899 Other long term (current) drug therapy: Secondary | ICD-10-CM | POA: Diagnosis not present

## 2014-10-09 DIAGNOSIS — Z0000 Encounter for general adult medical examination without abnormal findings: Secondary | ICD-10-CM | POA: Diagnosis not present

## 2014-10-09 DIAGNOSIS — E785 Hyperlipidemia, unspecified: Secondary | ICD-10-CM | POA: Diagnosis not present

## 2014-10-09 DIAGNOSIS — Z139 Encounter for screening, unspecified: Secondary | ICD-10-CM | POA: Diagnosis not present

## 2014-10-09 DIAGNOSIS — E559 Vitamin D deficiency, unspecified: Secondary | ICD-10-CM | POA: Diagnosis not present

## 2014-10-09 DIAGNOSIS — E0590 Thyrotoxicosis, unspecified without thyrotoxic crisis or storm: Secondary | ICD-10-CM | POA: Diagnosis not present

## 2014-10-10 DIAGNOSIS — S32591D Other specified fracture of right pubis, subsequent encounter for fracture with routine healing: Secondary | ICD-10-CM | POA: Diagnosis not present

## 2014-10-10 DIAGNOSIS — S32592D Other specified fracture of left pubis, subsequent encounter for fracture with routine healing: Secondary | ICD-10-CM | POA: Diagnosis not present

## 2014-10-10 DIAGNOSIS — Z9181 History of falling: Secondary | ICD-10-CM | POA: Diagnosis not present

## 2014-10-10 DIAGNOSIS — Z79891 Long term (current) use of opiate analgesic: Secondary | ICD-10-CM | POA: Diagnosis not present

## 2014-10-10 DIAGNOSIS — R412 Retrograde amnesia: Secondary | ICD-10-CM | POA: Diagnosis not present

## 2014-10-12 DIAGNOSIS — S32592D Other specified fracture of left pubis, subsequent encounter for fracture with routine healing: Secondary | ICD-10-CM | POA: Diagnosis not present

## 2014-10-12 DIAGNOSIS — R412 Retrograde amnesia: Secondary | ICD-10-CM | POA: Diagnosis not present

## 2014-10-12 DIAGNOSIS — Z79891 Long term (current) use of opiate analgesic: Secondary | ICD-10-CM | POA: Diagnosis not present

## 2014-10-12 DIAGNOSIS — Z9181 History of falling: Secondary | ICD-10-CM | POA: Diagnosis not present

## 2014-10-12 DIAGNOSIS — S32591D Other specified fracture of right pubis, subsequent encounter for fracture with routine healing: Secondary | ICD-10-CM | POA: Diagnosis not present

## 2014-10-15 DIAGNOSIS — Z79891 Long term (current) use of opiate analgesic: Secondary | ICD-10-CM | POA: Diagnosis not present

## 2014-10-15 DIAGNOSIS — Z9181 History of falling: Secondary | ICD-10-CM | POA: Diagnosis not present

## 2014-10-15 DIAGNOSIS — S32591D Other specified fracture of right pubis, subsequent encounter for fracture with routine healing: Secondary | ICD-10-CM | POA: Diagnosis not present

## 2014-10-15 DIAGNOSIS — S32592D Other specified fracture of left pubis, subsequent encounter for fracture with routine healing: Secondary | ICD-10-CM | POA: Diagnosis not present

## 2014-10-15 DIAGNOSIS — R412 Retrograde amnesia: Secondary | ICD-10-CM | POA: Diagnosis not present

## 2014-10-17 DIAGNOSIS — Z79891 Long term (current) use of opiate analgesic: Secondary | ICD-10-CM | POA: Diagnosis not present

## 2014-10-17 DIAGNOSIS — S32591D Other specified fracture of right pubis, subsequent encounter for fracture with routine healing: Secondary | ICD-10-CM | POA: Diagnosis not present

## 2014-10-17 DIAGNOSIS — R412 Retrograde amnesia: Secondary | ICD-10-CM | POA: Diagnosis not present

## 2014-10-17 DIAGNOSIS — S32592D Other specified fracture of left pubis, subsequent encounter for fracture with routine healing: Secondary | ICD-10-CM | POA: Diagnosis not present

## 2014-10-17 DIAGNOSIS — Z9181 History of falling: Secondary | ICD-10-CM | POA: Diagnosis not present

## 2014-10-26 DIAGNOSIS — R319 Hematuria, unspecified: Secondary | ICD-10-CM | POA: Diagnosis not present

## 2014-10-26 DIAGNOSIS — E0590 Thyrotoxicosis, unspecified without thyrotoxic crisis or storm: Secondary | ICD-10-CM | POA: Diagnosis not present

## 2014-11-20 ENCOUNTER — Emergency Department (HOSPITAL_COMMUNITY)
Admission: EM | Admit: 2014-11-20 | Discharge: 2014-11-20 | Disposition: A | Discharge status: Home / Self Care | Payer: Medicare Other | Attending: Emergency Medicine | Admitting: Emergency Medicine

## 2014-11-20 ENCOUNTER — Encounter (HOSPITAL_COMMUNITY): Payer: Self-pay | Admitting: Emergency Medicine

## 2014-11-20 DIAGNOSIS — E0789 Other specified disorders of thyroid: Secondary | ICD-10-CM | POA: Diagnosis not present

## 2014-11-20 DIAGNOSIS — R109 Unspecified abdominal pain: Secondary | ICD-10-CM

## 2014-11-20 DIAGNOSIS — I499 Cardiac arrhythmia, unspecified: Secondary | ICD-10-CM | POA: Diagnosis present

## 2014-11-20 DIAGNOSIS — Z880 Allergy status to penicillin: Secondary | ICD-10-CM | POA: Insufficient documentation

## 2014-11-20 DIAGNOSIS — R42 Dizziness and giddiness: Secondary | ICD-10-CM | POA: Diagnosis not present

## 2014-11-20 DIAGNOSIS — Z87891 Personal history of nicotine dependence: Secondary | ICD-10-CM | POA: Insufficient documentation

## 2014-11-20 DIAGNOSIS — R252 Cramp and spasm: Secondary | ICD-10-CM | POA: Diagnosis not present

## 2014-11-20 DIAGNOSIS — E039 Hypothyroidism, unspecified: Secondary | ICD-10-CM | POA: Diagnosis not present

## 2014-11-20 DIAGNOSIS — E038 Other specified hypothyroidism: Secondary | ICD-10-CM

## 2014-11-20 DIAGNOSIS — O26899 Other specified pregnancy related conditions, unspecified trimester: Secondary | ICD-10-CM

## 2014-11-20 DIAGNOSIS — R404 Transient alteration of awareness: Secondary | ICD-10-CM | POA: Diagnosis not present

## 2014-11-20 LAB — COMPREHENSIVE METABOLIC PANEL
ALK PHOS: 71 U/L (ref 38–126)
ALT: 20 U/L (ref 14–54)
AST: 25 U/L (ref 15–41)
Albumin: 3.8 g/dL (ref 3.5–5.0)
Anion gap: 9 (ref 5–15)
BUN: 14 mg/dL (ref 6–20)
CALCIUM: 9.4 mg/dL (ref 8.9–10.3)
CHLORIDE: 105 mmol/L (ref 101–111)
CO2: 25 mmol/L (ref 22–32)
CREATININE: 0.97 mg/dL (ref 0.44–1.00)
GFR calc non Af Amer: 53 mL/min — ABNORMAL LOW (ref >60)
Glucose, Bld: 100 mg/dL — ABNORMAL HIGH (ref 65–99)
Potassium: 3.8 mmol/L (ref 3.5–5.1)
Sodium: 139 mmol/L (ref 135–145)
Total Bilirubin: 0.8 mg/dL (ref 0.3–1.2)
Total Protein: 6 g/dL — ABNORMAL LOW (ref 6.5–8.1)

## 2014-11-20 LAB — CBC WITH DIFFERENTIAL/PLATELET
Basophils Absolute: 0 10*3/uL (ref 0.0–0.1)
Basophils Relative: 1 %
EOS PCT: 1 %
Eosinophils Absolute: 0.1 10*3/uL (ref 0.0–0.7)
HCT: 42.2 % (ref 36.0–46.0)
Hemoglobin: 13.9 g/dL (ref 12.0–15.0)
LYMPHS ABS: 1.2 10*3/uL (ref 0.7–4.0)
Lymphocytes Relative: 27 %
MCH: 30.6 pg (ref 26.0–34.0)
MCHC: 32.9 g/dL (ref 30.0–36.0)
MCV: 93 fL (ref 78.0–100.0)
MONOS PCT: 11 %
Monocytes Absolute: 0.5 10*3/uL (ref 0.1–1.0)
Neutro Abs: 2.7 10*3/uL (ref 1.7–7.7)
Neutrophils Relative %: 60 %
PLATELETS: 165 10*3/uL (ref 150–400)
RBC: 4.54 MIL/uL (ref 3.87–5.11)
RDW: 14.5 % (ref 11.5–15.5)
WBC: 4.6 10*3/uL (ref 4.0–10.5)

## 2014-11-20 LAB — URINALYSIS, ROUTINE W REFLEX MICROSCOPIC
BILIRUBIN URINE: NEGATIVE
Glucose, UA: NEGATIVE mg/dL
KETONES UR: NEGATIVE mg/dL
Leukocytes, UA: NEGATIVE
NITRITE: NEGATIVE
Protein, ur: NEGATIVE mg/dL
Specific Gravity, Urine: 1.008 (ref 1.005–1.030)
pH: 5.5 (ref 5.0–8.0)

## 2014-11-20 LAB — URINE MICROSCOPIC-ADD ON

## 2014-11-20 LAB — TSH: TSH: 0.235 u[IU]/mL — AB (ref 0.350–4.500)

## 2014-11-20 NOTE — ED Provider Notes (Signed)
CSN: [id number]     Arrival date & time 11/20/14  1819 History   First MD Initiated Contact with Patient 11/20/14 1820     Chief Complaint  Patient presents with  . Irregular Heart Beat     (Consider location/radiation/quality/duration/timing/severity/associated sxs/prior Treatment) HPI..... Right leg cramping and a sense of shivering just prior to arrival. No fever, sweats, chills, chest pain, dyspnea, dysuria. Patient lives independently at home with her son. EMS reported atrial fibrillation, but I see no evidence of that rhythm.  Patient reports intermittent episodes of leg cramping. Severity of symptoms is moderate. Nothing makes symptoms better or worse.  History reviewed. No pertinent past medical history. Past Surgical History  Procedure Laterality Date  . Wrist surgery    . Tonsillectomy    . Adenoidectomy    . Shoulder surgery      right   History reviewed. No pertinent family history. Social History  Substance Use Topics  . Smoking status: Former Games developermoker  . Smokeless tobacco: None  . Alcohol Use: Yes     Comment: occassional wine   OB History    No data available     Review of Systems  All other systems reviewed and are negative.     Allergies  Ether; Penicillins; and Amitriptyline  Home Medications   Prior to Admission medications   Medication Sig Start Date End Date Taking? Authorizing Provider  acetaminophen (TYLENOL) 500 MG tablet Take 2 tablets (1,000 mg total) by mouth every 8 (eight) hours as needed. 07/01/14  Yes Shanker Levora DredgeM Ghimire, MD   BP 130/82 mmHg  Pulse 62  Temp(Src) 97.6 F (36.4 C) (Oral)  Resp 16  SpO2 98% Physical Exam  Constitutional: She is oriented to person, place, and time. She appears well-developed and well-nourished.  HENT:  Head: Normocephalic and atraumatic.  Eyes: Conjunctivae and EOM are normal. Pupils are equal, round, and reactive to light.  Neck: Normal range of motion. Neck supple.  Cardiovascular: Normal rate and  regular rhythm.   Rhythm is regular.  Pulmonary/Chest: Effort normal and breath sounds normal.  Abdominal: Soft. Bowel sounds are normal.  Musculoskeletal: Normal range of motion.  Neurological: She is alert and oriented to person, place, and time.  Skin: Skin is warm and dry.  Psychiatric: She has a normal mood and affect. Her behavior is normal.  Nursing note and vitals reviewed.   ED Course  Procedures (including critical care time) Labs Review Labs Reviewed  COMPREHENSIVE METABOLIC PANEL - Abnormal; Notable for the following:    Glucose, Bld 100 (*)    Total Protein 6.0 (*)    GFR calc non Af Amer 53 (*)    All other components within normal limits  URINALYSIS, ROUTINE W REFLEX MICROSCOPIC (NOT AT [facility]) - Abnormal; Notable for the following:    Hgb urine dipstick SMALL (*)    All other components within normal limits  TSH - Abnormal; Notable for the following:    TSH 0.235 (*)    All other components within normal limits  URINE MICROSCOPIC-ADD ON - Abnormal; Notable for the following:    Squamous Epithelial / LPF 0-5 (*)    Bacteria, UA RARE (*)    All other components within normal limits  CBC WITH DIFFERENTIAL/PLATELET    Imaging Review No results found. I have personally reviewed and evaluated these images and lab results as part of my medical decision-making.   EKG Interpretation   Date/Time:  Tuesday November 20 2014 18:39:29 EST Ventricular Rate:  66 PR Interval:    QRS Duration: 76 QT Interval:  378 QTC Calculation: 396 R Axis:   -20 Text Interpretation:  Pacemaker spikes or artifacts Consider left  ventricular hypertrophy Inferior infarct, old Anterior Q waves, possibly  due to LVH Confirmed by Ezme Duch  MD, Solaris Kram ([id number]) on 11/20/2014 9:02:44 PM      MDM   Final diagnoses:  Cramping affecting pregnancy, antepartum  TSH deficiency    Patient is in no acute distress. Screening labs and EKG showed no acute problems. TSH was 0.235. Findings were  discussed with the patient and her son. She will follow-up with her primary care doctor.    Donnetta Hutching, MD 11/20/14 [phone]

## 2014-11-20 NOTE — ED Notes (Signed)
Pt ambulated to bathroom 15 minutes ago and does not need to go again right now.

## 2014-11-20 NOTE — Discharge Instructions (Signed)
Thyroid-stimulating hormone TSH was slightly low at 0.235. You may need a small amount of thyroid medication. I would discuss this with your primary care doctor and let him or her decide in regards to thyroid replacement. Otherwise blood work was normal.

## 2014-11-20 NOTE — ED Notes (Signed)
Pt to ED via [facility] - pt called out due to "feeling like my skin is shivering," dizziness, and feeling mildly short of breath. Per EMS pt is in and out of atrial fibrillation and normal sinus rhythm. Pt is a/o x4. VSS. No cardiac hx.

## 2014-11-20 NOTE — ED Notes (Signed)
The patient is unable to give an urine specimen at this time. The tech has reported to the RN in charge. 

## 2014-11-20 NOTE — ED Notes (Signed)
Patient taken to restroom and provided blanket.

## 2015-01-19 ENCOUNTER — Emergency Department (HOSPITAL_COMMUNITY)
Admission: EM | Admit: 2015-01-19 | Discharge: 2015-01-19 | Disposition: A | Discharge status: Home / Self Care | Payer: Medicare Other | Attending: Emergency Medicine | Admitting: Emergency Medicine

## 2015-01-19 ENCOUNTER — Encounter (HOSPITAL_COMMUNITY): Payer: Self-pay | Admitting: Emergency Medicine

## 2015-01-19 ENCOUNTER — Emergency Department (HOSPITAL_COMMUNITY): Payer: Medicare Other

## 2015-01-19 DIAGNOSIS — R404 Transient alteration of awareness: Secondary | ICD-10-CM | POA: Insufficient documentation

## 2015-01-19 DIAGNOSIS — R4182 Altered mental status, unspecified: Secondary | ICD-10-CM | POA: Diagnosis present

## 2015-01-19 DIAGNOSIS — F22 Delusional disorders: Secondary | ICD-10-CM | POA: Insufficient documentation

## 2015-01-19 DIAGNOSIS — Z87891 Personal history of nicotine dependence: Secondary | ICD-10-CM | POA: Insufficient documentation

## 2015-01-19 DIAGNOSIS — F4489 Other dissociative and conversion disorders: Secondary | ICD-10-CM | POA: Diagnosis not present

## 2015-01-19 DIAGNOSIS — Z880 Allergy status to penicillin: Secondary | ICD-10-CM | POA: Insufficient documentation

## 2015-01-19 DIAGNOSIS — I6789 Other cerebrovascular disease: Secondary | ICD-10-CM | POA: Diagnosis not present

## 2015-01-19 LAB — CBC WITH DIFFERENTIAL/PLATELET
BASOS PCT: 0 %
Basophils Absolute: 0 10*3/uL (ref 0.0–0.1)
Eosinophils Absolute: 0 10*3/uL (ref 0.0–0.7)
Eosinophils Relative: 1 %
HCT: 44.5 % (ref 36.0–46.0)
HEMOGLOBIN: 14.8 g/dL (ref 12.0–15.0)
Lymphocytes Relative: 15 %
Lymphs Abs: 1.1 10*3/uL (ref 0.7–4.0)
MCH: 31.3 pg (ref 26.0–34.0)
MCHC: 33.3 g/dL (ref 30.0–36.0)
MCV: 94.1 fL (ref 78.0–100.0)
MONO ABS: 0.7 10*3/uL (ref 0.1–1.0)
Monocytes Relative: 10 %
NEUTROS PCT: 74 %
Neutro Abs: 5.3 10*3/uL (ref 1.7–7.7)
PLATELETS: 145 10*3/uL — AB (ref 150–400)
RBC: 4.73 MIL/uL (ref 3.87–5.11)
RDW: 13.8 % (ref 11.5–15.5)
WBC: 7.1 10*3/uL (ref 4.0–10.5)

## 2015-01-19 LAB — URINALYSIS, ROUTINE W REFLEX MICROSCOPIC
GLUCOSE, UA: NEGATIVE mg/dL
Hgb urine dipstick: NEGATIVE
Ketones, ur: NEGATIVE mg/dL
LEUKOCYTES UA: NEGATIVE
NITRITE: NEGATIVE
PH: 5.5 (ref 5.0–8.0)
Protein, ur: NEGATIVE mg/dL
Specific Gravity, Urine: 1.035 — ABNORMAL HIGH (ref 1.005–1.030)

## 2015-01-19 LAB — COMPREHENSIVE METABOLIC PANEL
ALBUMIN: 4.6 g/dL (ref 3.5–5.0)
ALT: 16 U/L (ref 14–54)
ANION GAP: 12 (ref 5–15)
AST: 23 U/L (ref 15–41)
Alkaline Phosphatase: 78 U/L (ref 38–126)
BUN: 21 mg/dL — ABNORMAL HIGH (ref 6–20)
CO2: 23 mmol/L (ref 22–32)
Calcium: 10.1 mg/dL (ref 8.9–10.3)
Chloride: 110 mmol/L (ref 101–111)
Creatinine, Ser: 1 mg/dL (ref 0.44–1.00)
GFR calc non Af Amer: 51 mL/min — ABNORMAL LOW (ref >60)
GFR, EST AFRICAN AMERICAN: 60 mL/min — AB (ref >60)
Glucose, Bld: 117 mg/dL — ABNORMAL HIGH (ref 65–99)
POTASSIUM: 4.4 mmol/L (ref 3.5–5.1)
Sodium: 145 mmol/L (ref 135–145)
Total Bilirubin: 1 mg/dL (ref 0.3–1.2)
Total Protein: 7.3 g/dL (ref 6.5–8.1)

## 2015-01-19 LAB — CBG MONITORING, ED: Glucose-Capillary: 110 mg/dL — ABNORMAL HIGH (ref 65–99)

## 2015-01-19 NOTE — ED Notes (Signed)
Bed: [id number] Expected date:  Expected time:  Means of arrival:  Comments: EMS 82 yo female found alongside of road

## 2015-01-19 NOTE — ED Notes (Signed)
Pt comes to Ed via [facility], pt was found [address], is confused and disoriented, family notified and triage is being conducted.

## 2015-01-19 NOTE — Discharge Instructions (Signed)
Confusion Confusion is the inability to think with your usual speed or clarity. Confusion may come on quickly or slowly over time. How quickly the confusion comes on depends on the cause. Confusion can be due to any number of causes. CAUSES   Concussion, head injury, or head trauma.  Seizures.  Stroke.  Fever.  Brain tumor.  Age related decreased brain function (dementia).  Heightened emotional states like rage or terror.  Mental illness in which the person loses the ability to determine what is real and what is not (hallucinations).  Infections such as a urinary tract infection (UTI).  Toxic effects from alcohol, drugs, or prescription medicines.  Dehydration and an imbalance of salts in the body (electrolytes).  Lack of sleep.  Low blood sugar (diabetes).  Low levels of oxygen from conditions such as chronic lung disorders.  Drug interactions or other medicine side effects.  Nutritional deficiencies, especially niacin, thiamine, vitamin C, or vitamin B.  Sudden drop in body temperature (hypothermia).  Change in routine, such as when traveling or hospitalized. SIGNS AND SYMPTOMS  People often describe their thinking as cloudy or unclear when they are confused. Confusion can also include feeling disoriented. That means you are unaware of where or who you are. You may also not know what the date or time is. If confused, you may also have difficulty paying attention, remembering, and making decisions. Some people also act aggressively when they are confused.  DIAGNOSIS  The medical evaluation of confusion may include:  Blood and urine tests.  X-rays.  Brain and nervous system tests.  Analyzing your brain waves (electroencephalogram or EEG).  Magnetic resonance imaging (MRI) of your head.  Computed tomography (CT) scan of your head.  Mental status tests in which your health care provider may ask many questions. Some of these questions may seem silly or strange,  but they are a very important test to help diagnose and treat confusion. TREATMENT  An admission to the hospital may not be needed, but a person with confusion should not be left alone. Stay with a family member or friend until the confusion clears. Avoid alcohol, pain relievers, or sedative drugs until you have fully recovered. Do not drive until directed by your health care provider. HOME CARE INSTRUCTIONS  What family and friends can do:  To find out if someone is confused, ask the person to state his or her name, age, and the date. If the person is unsure or answers incorrectly, he or she is confused.  Always introduce yourself, no matter how well the person knows you.  Often remind the person of his or her location.  Place a calendar and clock near the confused person.  Help the person with his or her medicines. You may want to use a pill box, an alarm as a reminder, or give the person each dose as prescribed.  Talk about current events and plans for the day.  Try to keep the environment calm, quiet, and peaceful.  Make sure the person keeps follow-up visits with his or her health care provider. PREVENTION  Ways to prevent confusion:  Avoid alcohol.  Eat a balanced diet.  Get enough sleep.  Take medicine only as directed by your health care provider.  Do not become isolated. Spend time with other people and make plans for your days.  Keep careful watch on your blood sugar levels if you are diabetic. SEEK IMMEDIATE MEDICAL CARE IF:   You develop severe headaches, repeated vomiting, seizures, blackouts, or   slurred speech.  There is increasing confusion, weakness, numbness, restlessness, or personality changes.  You develop a loss of balance, have marked dizziness, feel uncoordinated, or fall.  You have delusions, hallucinations, or develop severe anxiety.  Your family members think you need to be rechecked.   This information is not intended to replace advice given  to you by your health care provider. Make sure you discuss any questions you have with your health care provider.   Document Released: 01/30/2004 Document Revised: 01/12/2014 Document Reviewed: 01/27/2013 Elsevier Interactive Patient Education 2016 [employer]  

## 2015-01-19 NOTE — ED Provider Notes (Signed)
CSN: [id number]     Arrival date & time 01/19/15  0125 History  By signing my name below, I, Phillis Haggis, attest that this documentation has been prepared under the direction and in the presence of Lyndal Pulley, MD. Electronically Signed: Phillis Haggis, ED Scribe. 01/19/2015. 3:28 AM.  Chief Complaint  Patient presents with  . Altered Mental Status   Patient is a 82 y.o. female presenting with altered mental status. The history is provided by the EMS personnel. No language interpreter was used.  Altered Mental Status Presenting symptoms: confusion and disorientation   Severity:  Moderate Most recent episode:  Today Episode history:  Single Timing:  Constant Progression:  Worsening Chronicity:  New Context: not homeless   HPI Comments (Level 5 Caveat due to altered mental status): Yolanda Shaw is a 82 y.o. Female with no noted significant medical hx brought in by EMS who presents to the Emergency Department complaining of altered mental status onset PTA. Per triage note, pt was found wandering [address] with confusion and disorientation. Pt states that she was in an unfamiliar room and naked, so she decided to walk around the house. She states that only one door was unlocked, so she left the house and began to walk down the street. She does not remember falling asleep or waking up, she states that she just "found herself in the house." Pt states that [place] [address]. Pt is oriented to person and place, "I don't care what today is. I just want to go home." She states that she was given clothes by EMS. She states that she felt normal yesterday. She denies recent cough.   History reviewed. No pertinent past medical history. Past Surgical History  Procedure Laterality Date  . Wrist surgery    . Tonsillectomy    . Adenoidectomy    . Shoulder surgery      right   No family history on file. Social History  Substance Use Topics  . Smoking status: Former Games developer  .  Smokeless tobacco: None  . Alcohol Use: Yes     Comment: occassional wine   OB History    No data available     Review of Systems  Unable to perform ROS: Mental status change  Psychiatric/Behavioral: Positive for confusion.   Allergies  Ether; Penicillins; and Amitriptyline  Home Medications   Prior to Admission medications   Medication Sig Start Date End Date Taking? Authorizing Provider  acetaminophen (TYLENOL) 500 MG tablet Take 2 tablets (1,000 mg total) by mouth every 8 (eight) hours as needed. 07/01/14   Shanker Levora Dredge, MD   BP 138/99 mmHg  Pulse 90  Temp(Src) 97.7 F (36.5 C) (Oral)  Resp 18  SpO2 99% Physical Exam  Constitutional: She is oriented to person, place, and time. She appears well-developed and well-nourished. No distress.  HENT:  Head: Normocephalic and atraumatic.  Mouth/Throat: Oropharynx is clear and moist. No oropharyngeal exudate.  Trachea midline  Eyes: Conjunctivae and EOM are normal. Pupils are equal, round, and reactive to light.  Neck: Trachea normal and normal range of motion. Neck supple. No JVD present. Carotid bruit is not present.  Cardiovascular: Normal rate and regular rhythm.  Exam reveals no gallop and no friction rub.   No murmur heard. Pulmonary/Chest: Effort normal and breath sounds normal. No stridor. She has no wheezes. She has no rales.  Abdominal: Soft. Bowel sounds are normal. She exhibits no mass. There is no tenderness. There is no rebound and no guarding.  Musculoskeletal: Normal range of motion.  Lymphadenopathy:    She has no cervical adenopathy.  Neurological: She is alert and oriented to person, place, and time. She has normal reflexes. No cranial nerve deficit. She exhibits normal muscle tone. Coordination normal.  Cranial nerves 2-12 intact  Skin: Skin is warm and dry. She is not diaphoretic.  Psychiatric: Thought content is paranoid (believes people are trying to kidnap her).  confused    ED Course  Procedures  (including critical care time) DIAGNOSTIC STUDIES: Oxygen Saturation is 99% on RA, normal by my interpretation.    COORDINATION OF CARE: 2:00 AM-Discussed treatment plan which includes labs with pt at bedside and pt agreed to plan.   2:26 AM- spoke with pt's son about pt. He states that she was at home and not at a stranger's house like she mentioned. Pt's son is her caretaker but was out when the incident happened. He states that she has been having intermittent dementia symptoms over the past year.   Labs Review Labs Reviewed  CBC WITH DIFFERENTIAL/PLATELET - Abnormal; Notable for the following:    Platelets 145 (*)    All other components within normal limits  COMPREHENSIVE METABOLIC PANEL - Abnormal; Notable for the following:    Glucose, Bld 117 (*)    BUN 21 (*)    GFR calc non Af Amer 51 (*)    GFR calc Af Amer 60 (*)    All other components within normal limits  URINALYSIS, ROUTINE W REFLEX MICROSCOPIC (NOT AT [facility]) - Abnormal; Notable for the following:    Color, Urine AMBER (*)    Specific Gravity, Urine 1.035 (*)    Bilirubin Urine SMALL (*)    All other components within normal limits  CBG MONITORING, ED - Abnormal; Notable for the following:    Glucose-Capillary 110 (*)    All other components within normal limits    Imaging Review No results found. I have personally reviewed and evaluated these images and lab results as part of my medical decision-making.   EKG Interpretation None      MDM   Final diagnoses:  Transient alteration of awareness    82 year old feel presents after being found partially clothed walking around [address] near her house. On arrival the only information available is that her son was intoxicated who lives with her, she tells me that she woke up and someone else's house and was trying to escape, concerned she was being kidnapped. Workup initiated for organic causes of her paranoia and confusion. The patient's other son and  daughter-in-law came to the emergency department and saw the patient. They state that she has had a progressive decline over the last year and has had intermittent episodes where she becomes confused. She is at her baseline currently. They state that her episode tonight occurred in her own house and her normal supervision was not available. Paranoid thoughts have become more common for her recently. I initially recommended CT scan to evaluate for intracranial causes but after discussion with family they wish to forego this study due to her likely need for some sedation. I explained the risks of missing a potential important finding but agreed that this likely represents progressing dementia rather than ischemia and do not feel this should prevent her from being able to go home and continue an outpatient workup with her primary care physician. Plan to follow up with PCP as needed and return precautions discussed for worsening or new concerning symptoms.   I  personally performed the services described in this documentation, which was scribed in my presence. The recorded information has been reviewed and is accurate.     Lyndal Pulley, MD 01/19/15 [phone]

## 2015-01-29 DIAGNOSIS — H26493 Other secondary cataract, bilateral: Secondary | ICD-10-CM | POA: Diagnosis not present

## 2015-10-08 ENCOUNTER — Other Ambulatory Visit: Payer: Self-pay | Admitting: Internal Medicine

## 2015-10-08 ENCOUNTER — Ambulatory Visit
Admission: RE | Admit: 2015-10-08 | Discharge: 2015-10-08 | Disposition: A | Discharge status: Home / Self Care | Payer: Medicare Other | Source: Ambulatory Visit | Attending: Internal Medicine | Admitting: Internal Medicine

## 2015-10-08 DIAGNOSIS — Z1389 Encounter for screening for other disorder: Secondary | ICD-10-CM | POA: Diagnosis not present

## 2015-10-08 DIAGNOSIS — R413 Other amnesia: Secondary | ICD-10-CM | POA: Diagnosis not present

## 2015-10-08 DIAGNOSIS — R269 Unspecified abnormalities of gait and mobility: Secondary | ICD-10-CM

## 2015-10-08 DIAGNOSIS — R2681 Unsteadiness on feet: Secondary | ICD-10-CM | POA: Diagnosis not present

## 2015-10-08 DIAGNOSIS — R251 Tremor, unspecified: Secondary | ICD-10-CM | POA: Diagnosis not present

## 2015-11-08 DIAGNOSIS — M545 Low back pain: Secondary | ICD-10-CM | POA: Diagnosis not present

## 2015-11-08 DIAGNOSIS — R269 Unspecified abnormalities of gait and mobility: Secondary | ICD-10-CM | POA: Diagnosis not present

## 2015-11-13 DIAGNOSIS — M545 Low back pain: Secondary | ICD-10-CM | POA: Diagnosis not present

## 2015-11-13 DIAGNOSIS — R269 Unspecified abnormalities of gait and mobility: Secondary | ICD-10-CM | POA: Diagnosis not present

## 2015-11-15 DIAGNOSIS — R269 Unspecified abnormalities of gait and mobility: Secondary | ICD-10-CM | POA: Diagnosis not present

## 2015-11-15 DIAGNOSIS — M545 Low back pain: Secondary | ICD-10-CM | POA: Diagnosis not present

## 2015-11-29 DIAGNOSIS — M545 Low back pain: Secondary | ICD-10-CM | POA: Diagnosis not present

## 2015-11-29 DIAGNOSIS — R269 Unspecified abnormalities of gait and mobility: Secondary | ICD-10-CM | POA: Diagnosis not present

## 2015-12-06 DIAGNOSIS — R269 Unspecified abnormalities of gait and mobility: Secondary | ICD-10-CM | POA: Diagnosis not present

## 2015-12-06 DIAGNOSIS — M545 Low back pain: Secondary | ICD-10-CM | POA: Diagnosis not present

## 2015-12-19 DIAGNOSIS — M545 Low back pain: Secondary | ICD-10-CM | POA: Diagnosis not present

## 2015-12-19 DIAGNOSIS — R269 Unspecified abnormalities of gait and mobility: Secondary | ICD-10-CM | POA: Diagnosis not present

## 2015-12-24 DIAGNOSIS — R269 Unspecified abnormalities of gait and mobility: Secondary | ICD-10-CM | POA: Diagnosis not present

## 2015-12-24 DIAGNOSIS — M545 Low back pain: Secondary | ICD-10-CM | POA: Diagnosis not present

## 2016-01-01 ENCOUNTER — Emergency Department (HOSPITAL_COMMUNITY)
Admission: EM | Admit: 2016-01-01 | Discharge: 2016-01-01 | Disposition: A | Discharge status: Home / Self Care | Payer: Medicare Other | Attending: Emergency Medicine | Admitting: Emergency Medicine

## 2016-01-01 ENCOUNTER — Emergency Department (HOSPITAL_COMMUNITY): Payer: Medicare Other

## 2016-01-01 ENCOUNTER — Encounter (HOSPITAL_COMMUNITY): Payer: Self-pay

## 2016-01-01 DIAGNOSIS — R1032 Left lower quadrant pain: Secondary | ICD-10-CM | POA: Diagnosis present

## 2016-01-01 DIAGNOSIS — R100 Acute abdomen: Secondary | ICD-10-CM | POA: Diagnosis not present

## 2016-01-01 DIAGNOSIS — R1900 Intra-abdominal and pelvic swelling, mass and lump, unspecified site: Secondary | ICD-10-CM

## 2016-01-01 DIAGNOSIS — Z87891 Personal history of nicotine dependence: Secondary | ICD-10-CM | POA: Diagnosis not present

## 2016-01-01 DIAGNOSIS — S3690XA Unspecified injury of unspecified intra-abdominal organ, initial encounter: Secondary | ICD-10-CM | POA: Diagnosis not present

## 2016-01-01 DIAGNOSIS — N858 Other specified noninflammatory disorders of uterus: Secondary | ICD-10-CM | POA: Insufficient documentation

## 2016-01-01 DIAGNOSIS — K5732 Diverticulitis of large intestine without perforation or abscess without bleeding: Secondary | ICD-10-CM | POA: Diagnosis not present

## 2016-01-01 DIAGNOSIS — N949 Unspecified condition associated with female genital organs and menstrual cycle: Secondary | ICD-10-CM

## 2016-01-01 DIAGNOSIS — N838 Other noninflammatory disorders of ovary, fallopian tube and broad ligament: Secondary | ICD-10-CM | POA: Diagnosis not present

## 2016-01-01 DIAGNOSIS — R1904 Left lower quadrant abdominal swelling, mass and lump: Secondary | ICD-10-CM | POA: Diagnosis not present

## 2016-01-01 DIAGNOSIS — Z79899 Other long term (current) drug therapy: Secondary | ICD-10-CM | POA: Diagnosis not present

## 2016-01-01 DIAGNOSIS — Z7982 Long term (current) use of aspirin: Secondary | ICD-10-CM | POA: Diagnosis not present

## 2016-01-01 LAB — CBC
HCT: 44.8 % (ref 36.0–46.0)
Hemoglobin: 15 g/dL (ref 12.0–15.0)
MCH: 30.5 pg (ref 26.0–34.0)
MCHC: 33.5 g/dL (ref 30.0–36.0)
MCV: 91.1 fL (ref 78.0–100.0)
Platelets: 158 10*3/uL (ref 150–400)
RBC: 4.92 MIL/uL (ref 3.87–5.11)
RDW: 14.4 % (ref 11.5–15.5)
WBC: 5.8 10*3/uL (ref 4.0–10.5)

## 2016-01-01 LAB — BASIC METABOLIC PANEL
Anion gap: 8 (ref 5–15)
BUN: 16 mg/dL (ref 6–20)
CO2: 27 mmol/L (ref 22–32)
Calcium: 9.5 mg/dL (ref 8.9–10.3)
Chloride: 105 mmol/L (ref 101–111)
Creatinine, Ser: 0.86 mg/dL (ref 0.44–1.00)
GFR calc Af Amer: >60 mL/min (ref >60)
GFR calc non Af Amer: >60 mL/min (ref >60)
Glucose, Bld: 90 mg/dL (ref 65–99)
Potassium: 4.2 mmol/L (ref 3.5–5.1)
Sodium: 140 mmol/L (ref 135–145)

## 2016-01-01 LAB — URINALYSIS, ROUTINE W REFLEX MICROSCOPIC
Bacteria, UA: NONE SEEN
Bilirubin Urine: NEGATIVE
Glucose, UA: NEGATIVE mg/dL
Ketones, ur: 5 mg/dL — AB
Leukocytes, UA: NEGATIVE
Nitrite: NEGATIVE
Protein, ur: NEGATIVE mg/dL
Specific Gravity, Urine: 1.014 (ref 1.005–1.030)
Squamous Epithelial / HPF: NONE SEEN
pH: 5 (ref 5.0–8.0)

## 2016-01-01 MED ORDER — IOPAMIDOL (ISOVUE-300) INJECTION 61%
INTRAVENOUS | Status: AC
  Administered 2016-01-01: 100 mL
  Filled 2016-01-01: qty 100

## 2016-01-01 MED ORDER — IOPAMIDOL (ISOVUE-300) INJECTION 61%
INTRAVENOUS | Status: AC
  Filled 2016-01-01: qty 30

## 2016-01-01 MED ORDER — IOPAMIDOL (ISOVUE-300) INJECTION 61%
30.0000 mL | Freq: Once | INTRAVENOUS | Status: AC | PRN
  Administered 2016-01-01: 30 mL via ORAL

## 2016-01-01 MED ORDER — ACETAMINOPHEN 325 MG PO TABS
650.0000 mg | ORAL_TABLET | Freq: Once | ORAL | Status: AC
  Administered 2016-01-01: 650 mg via ORAL
  Filled 2016-01-01: qty 2

## 2016-01-01 NOTE — Discharge Instructions (Signed)
Your CT scan showed large stool burden in your colon.  Please monitor your bowel movements to ensure you do not develop constipation.  You may take a stool softener or laxative as needed. Your CT scan also showed a 4.7 cm cyst like area on your right ovary.  Please call your primary care provider and make an appointment as soon as possible for further discussion and evaluation of your CT findings in the emergency room.   Your blood pressure was elevated today, please discuss this with your primary care doctor as well.   If you develop abdominal pain you may take tylenol as needed.   Return to the emergency department if you develop fever, nausea, vomiting, constipation, abdominal pain.

## 2016-01-01 NOTE — ED Triage Notes (Signed)
Per EMS- patient reports that she tripped and fell 4 days ago. Today, Patient c/o bulge left lower abdomen and pain radiates to her back.

## 2016-01-01 NOTE — ED Provider Notes (Signed)
Pt was signed off to me by previous ED PA, Lorretta HarpFrank Espina at shift change.   In brief, pt is an 82 year old female who presents c/o LLQ bulge.  Pt had a fall four days ago, pt and son state pt tripped over the corner of a rug and pt fell onto a table.  Soon after is when she noticed the bulge.  Unsure if pt has had hernia in that area in the past.  There was no chest pain, dizziness, shortness of breath, light-headedness prior to fall.  Pt denies head injury or LOC after fall. Patient takes aspirin daily.  Pt denies abdominal pain, n/v/d/c to me.  No fevers, no recent weight loss.    Physical Exam  BP 170/98 (BP Location: Right Arm)   Pulse 76   Temp 97.8 F (36.6 C) (Oral)   Resp 18   SpO2 97%   Physical Exam  Constitutional: She is oriented to person, place, and time. Vital signs are normal. She appears well-developed and well-nourished. No distress.  HENT:  Head: Normocephalic and atraumatic.  Nose: Nose normal.  Mouth/Throat: Oropharynx is clear and moist. No oropharyngeal exudate.  Eyes: Conjunctivae and EOM are normal. Pupils are equal, round, and reactive to light.  Neck: Normal range of motion. Neck supple. No JVD present.  Cardiovascular: Normal rate, regular rhythm, normal heart sounds and intact distal pulses.   No murmur heard. Pulmonary/Chest: Effort normal. No respiratory distress. She has no wheezes.  Abdominal:  Abdomen is soft, non tender, no rigidity, rebound or tenderness.  There is a small L lower abdominal bulge that is non tender without overlaying skin changes, I was unable to palpate abdominal wall defect, however bulge is easily reducible without pain.  No suprapubic tenderness, no CVAT bilaterally.   Musculoskeletal: Normal range of motion.  Lymphadenopathy:    She has no cervical adenopathy.  Neurological: She is alert and oriented to person, place, and time.  Skin: Skin is warm and dry.  Psychiatric: She has a normal mood and affect. Her behavior is normal.   Nursing note and vitals reviewed.   ED Course  Procedures  MDM  I reviewed ED work up including U/A, BMP, CBC - all grossly unremarkable.  CT A/P remarkable for large stool burden in colon and 4.7 cm cystic area within R adnexa likely ovarian cyst, unknown if benign or malignant.  I discussed CT findings with pt and pt's son.  Pt's vitals rechecked as her SBP has been elevated while in ED. Pt states she does not have h/o HTN, but she was crying last time they took her blood pressure.  Pt states she does not have issues with constipation, she last had a BM this morning.  Pt states she will not take a stool softner or laxative because she doesn't need it.  Pt denies issues with blood pressure.  Repeat SBP unchanged.  Pt denies chest pain, shortness of breath, headache, changes in vision.    Discussed elevated BP with pt and pt's son, instructed to discuss this with PCP.  Pt will be discharged with tylenol prn for abdominal pain and close follow up with PCP for R adnexal finding and elevated SBP in ED.   CT findings printed and handed to pt.     Liberty HandyClaudia J Jatavious Peppard, PA-C 01/01/16 1917    Gerhard Munchobert Lockwood, MD 01/02/16 [phone]

## 2016-01-01 NOTE — ED Provider Notes (Signed)
[facility]-EMERGENCY DEPT Provider Note   CSN: [id number] Arrival date & time: 01/01/16  [id number]     History   Chief Complaint Chief Complaint  Patient presents with  . Abdominal Pain    HPI Yolanda Shaw is a 82 y.o. female presents With complaints of bulge in the left lower quadrant and mid left back pain status post a fall on Sunday. Patient states that she's had this positive left lower quadrant since she fell and is very uncomfortable. She is not able to lie down and is forced to sit up to ease pain. Patient says that pain is localized to the area. Patient states that she has tried ice and has helped her. She tried aspirin and Narco for pain and has not helped much. Patient also endorses mid left back pain, Localized and states that is tender to palpation. She states that she is able to ambulate. Patient denies chest pain, shortness of breath, fevers, chills, LOC, changes in bowel movements, changes in urinary symptoms.   HPI  History reviewed. No pertinent past medical history.  Patient Active Problem List   Diagnosis Date Noted  . Cramping affecting pregnancy, antepartum 11/20/2014  . Pelvic fracture (HCC) 06/30/2014  . Thrombocytopenia (HCC) 06/30/2014  . Tremor 06/30/2014  . Pressure ulcer 06/30/2014  . Fracture of left pubis (HCC)   . Ischium fracture (HCC)   . Left hip pain   . Bilateral pubic rami fractures (HCC) 06/29/2014    Past Surgical History:  Procedure Laterality Date  . ADENOIDECTOMY    . SHOULDER SURGERY     right  . TONSILLECTOMY    . WRIST SURGERY      OB History    No data available       Home Medications    Prior to Admission medications   Medication Sig Start Date End Date Taking? Authorizing Provider  aspirin 325 MG EC tablet Take 325 mg by mouth every 6 (six) hours as needed for pain.   Yes Historical Provider, MD  acetaminophen (TYLENOL) 500 MG tablet Take 2 tablets (1,000 mg total) by mouth every 8 (eight) hours as needed. Patient not  taking: Reported on 01/01/2016 07/01/14   Maretta BeesShanker M Ghimire, MD    Family History No family history on file.  Social History Social History  Substance Use Topics  . Smoking status: Former Games developermoker  . Smokeless tobacco: Never Used  . Alcohol use Yes     Comment: occassional wine     Allergies   Ether; Amitriptyline; and Penicillins   Review of Systems Review of Systems  Constitutional: Negative for chills and fever.  HENT: Negative for ear pain and sore throat.   Eyes: Negative for visual disturbance.  Respiratory: Negative for cough and shortness of breath.   Cardiovascular: Negative for chest pain and palpitations.  Gastrointestinal: Positive for abdominal pain (LLQ). Negative for constipation, diarrhea, nausea and vomiting.       Bulge at LLQ  Genitourinary: Negative for difficulty urinating and dysuria.  Musculoskeletal: Positive for back pain (Left mid back). Negative for arthralgias.  Skin: Negative for color change, rash and wound.  Neurological:       No LOC     Physical Exam Updated Vital Signs BP (!) 191/117   Pulse 89   Temp 97.8 F (36.6 C) (Oral)   Resp 18   SpO2 96%   Physical Exam  Constitutional: She is oriented to person, place, and time. She appears well-developed and well-nourished.  HENT:  Head: Normocephalic  and atraumatic.  Nose: Nose normal.  Eyes: Conjunctivae and EOM are normal. Pupils are equal, round, and reactive to light.  Neck: Normal range of motion. Neck supple.  Cardiovascular: Normal rate and normal heart sounds.   Pulmonary/Chest: Effort normal and breath sounds normal. No respiratory distress. She exhibits no tenderness.  Abdominal: Soft. Bowel sounds are normal. There is tenderness (Slight TTP to bulge on LLQ. ). There is no rebound, no guarding, no tenderness at McBurney's point and negative Murphy's sign. A hernia is present.  Musculoskeletal: Normal range of motion. She exhibits tenderness (Left mid back). She exhibits no  edema or deformity.  Neurological: She is alert and oriented to person, place, and time.  Skin: Skin is warm. Capillary refill takes less than 2 seconds. No rash noted. No erythema.  Psychiatric: She has a normal mood and affect. Her behavior is normal.  Nursing note and vitals reviewed.    ED Treatments / Results  Labs (all labs ordered are listed, but only abnormal results are displayed) Labs Reviewed  URINALYSIS, ROUTINE W REFLEX MICROSCOPIC - Abnormal; Notable for the following:       Result Value   Hgb urine dipstick MODERATE (*)    Ketones, ur 5 (*)    All other components within normal limits  BASIC METABOLIC PANEL  CBC    EKG  EKG Interpretation None       Radiology No results found.  Procedures Procedures (including critical care time)  Medications Ordered in ED Medications  iopamidol (ISOVUE-300) 61 % injection (not administered)  iopamidol (ISOVUE-300) 61 % injection 30 mL (30 mLs Oral Contrast Given 01/01/16 1442)  acetaminophen (TYLENOL) tablet 650 mg (650 mg Oral Given 01/01/16 1556)  iopamidol (ISOVUE-300) 61 % injection (100 mLs  Contrast Given 01/01/16 1656)     Initial Impression / Assessment and Plan / ED Course  I have reviewed the triage vital signs and the nursing notes.  Pertinent labs & imaging results that were available during my care of the patient were reviewed by me and considered in my medical decision making (see chart for details).  Clinical Course   Patient is an 82 year old female presenting with complaints of prior fall on Sunday. No LOC or trauma to head. Patient has had worsening pain and a visual bulge on left lower quadrant as well as left mid back pain since the fall. Patient afebrile, vital signs stable, no apparent distress. Head appears atraumatic. Heart and lung sounds clear. Left lower quadrant appears to have a significant bulge. Bulge is slightly tender to palpation. No redness to the area. No rash. Left mid back also  tender to palpation. Abdomen his otherwise soft and nontender. No focal tenderness to McBurney's point. Negative Murphy sign. Labs are unremarkable for any signs of infection or electrolyte imbalance.  Dr. Rubin PayorPickering also saw and evaluated patient.  . 16:20  Still waiting on CT   At shift change care was transferred to Sharen Hecklaudia Gibbons, PA-C who will follow pending studies, re-evaulate and determine disposition.    Final Clinical Impressions(s) / ED Diagnoses   Final diagnoses:  None    New Prescriptions New Prescriptions   No medications on file     [id number] Manuel BuckinghamEspina, GeorgiaPA 01/01/16 1705    Benjiman CoreNathan Pickering, MD 01/03/16 1452

## 2016-01-01 NOTE — ED Notes (Signed)
Nurse is in the room starting an IV and collecting labs 

## 2016-01-01 NOTE — ED Notes (Signed)
Pt made aware we need a urine sample

## 2016-01-01 NOTE — ED Triage Notes (Signed)
Patient denies N/v/D. And states at the moment sheis not having any pain.

## 2016-01-21 DIAGNOSIS — R269 Unspecified abnormalities of gait and mobility: Secondary | ICD-10-CM | POA: Diagnosis not present

## 2016-01-21 DIAGNOSIS — M545 Low back pain: Secondary | ICD-10-CM | POA: Diagnosis not present

## 2016-01-24 DIAGNOSIS — M545 Low back pain: Secondary | ICD-10-CM | POA: Diagnosis not present

## 2016-01-24 DIAGNOSIS — R269 Unspecified abnormalities of gait and mobility: Secondary | ICD-10-CM | POA: Diagnosis not present

## 2016-01-31 DIAGNOSIS — M545 Low back pain: Secondary | ICD-10-CM | POA: Diagnosis not present

## 2016-01-31 DIAGNOSIS — R269 Unspecified abnormalities of gait and mobility: Secondary | ICD-10-CM | POA: Diagnosis not present

## 2016-02-04 DIAGNOSIS — M545 Low back pain: Secondary | ICD-10-CM | POA: Diagnosis not present

## 2016-02-04 DIAGNOSIS — R269 Unspecified abnormalities of gait and mobility: Secondary | ICD-10-CM | POA: Diagnosis not present

## 2016-02-07 DIAGNOSIS — R269 Unspecified abnormalities of gait and mobility: Secondary | ICD-10-CM | POA: Diagnosis not present

## 2016-02-07 DIAGNOSIS — M545 Low back pain: Secondary | ICD-10-CM | POA: Diagnosis not present

## 2016-02-17 ENCOUNTER — Emergency Department (HOSPITAL_COMMUNITY): Payer: Medicare Other

## 2016-02-17 ENCOUNTER — Encounter (HOSPITAL_COMMUNITY): Payer: Self-pay | Admitting: Emergency Medicine

## 2016-02-17 ENCOUNTER — Observation Stay (HOSPITAL_COMMUNITY)
Admission: EM | Admit: 2016-02-17 | Discharge: 2016-02-19 | Disposition: A | Discharge status: Skilled Nursing Facility | Payer: Medicare Other | Attending: Family Medicine | Admitting: Family Medicine

## 2016-02-17 DIAGNOSIS — F0390 Unspecified dementia without behavioral disturbance: Secondary | ICD-10-CM | POA: Diagnosis not present

## 2016-02-17 DIAGNOSIS — M25511 Pain in right shoulder: Secondary | ICD-10-CM | POA: Diagnosis not present

## 2016-02-17 DIAGNOSIS — R918 Other nonspecific abnormal finding of lung field: Secondary | ICD-10-CM | POA: Insufficient documentation

## 2016-02-17 DIAGNOSIS — R51 Headache: Secondary | ICD-10-CM | POA: Diagnosis not present

## 2016-02-17 DIAGNOSIS — Z87891 Personal history of nicotine dependence: Secondary | ICD-10-CM | POA: Insufficient documentation

## 2016-02-17 DIAGNOSIS — M549 Dorsalgia, unspecified: Secondary | ICD-10-CM | POA: Insufficient documentation

## 2016-02-17 DIAGNOSIS — Z79899 Other long term (current) drug therapy: Secondary | ICD-10-CM | POA: Diagnosis not present

## 2016-02-17 DIAGNOSIS — M25551 Pain in right hip: Secondary | ICD-10-CM | POA: Diagnosis not present

## 2016-02-17 DIAGNOSIS — S32591A Other specified fracture of right pubis, initial encounter for closed fracture: Secondary | ICD-10-CM | POA: Diagnosis not present

## 2016-02-17 DIAGNOSIS — S32599A Other specified fracture of unspecified pubis, initial encounter for closed fracture: Secondary | ICD-10-CM | POA: Diagnosis not present

## 2016-02-17 DIAGNOSIS — M542 Cervicalgia: Secondary | ICD-10-CM | POA: Insufficient documentation

## 2016-02-17 DIAGNOSIS — Z888 Allergy status to other drugs, medicaments and biological substances status: Secondary | ICD-10-CM | POA: Insufficient documentation

## 2016-02-17 DIAGNOSIS — N179 Acute kidney failure, unspecified: Secondary | ICD-10-CM

## 2016-02-17 DIAGNOSIS — E042 Nontoxic multinodular goiter: Secondary | ICD-10-CM | POA: Diagnosis not present

## 2016-02-17 DIAGNOSIS — S8991XA Unspecified injury of right lower leg, initial encounter: Secondary | ICD-10-CM | POA: Diagnosis not present

## 2016-02-17 DIAGNOSIS — I10 Essential (primary) hypertension: Secondary | ICD-10-CM | POA: Diagnosis not present

## 2016-02-17 DIAGNOSIS — S0990XA Unspecified injury of head, initial encounter: Secondary | ICD-10-CM | POA: Diagnosis not present

## 2016-02-17 DIAGNOSIS — Z880 Allergy status to penicillin: Secondary | ICD-10-CM | POA: Diagnosis not present

## 2016-02-17 DIAGNOSIS — Z7982 Long term (current) use of aspirin: Secondary | ICD-10-CM | POA: Diagnosis not present

## 2016-02-17 DIAGNOSIS — W010XXA Fall on same level from slipping, tripping and stumbling without subsequent striking against object, initial encounter: Secondary | ICD-10-CM | POA: Insufficient documentation

## 2016-02-17 DIAGNOSIS — M47812 Spondylosis without myelopathy or radiculopathy, cervical region: Secondary | ICD-10-CM | POA: Insufficient documentation

## 2016-02-17 DIAGNOSIS — S42201A Unspecified fracture of upper end of right humerus, initial encounter for closed fracture: Secondary | ICD-10-CM | POA: Diagnosis not present

## 2016-02-17 DIAGNOSIS — F0391 Unspecified dementia with behavioral disturbance: Secondary | ICD-10-CM | POA: Diagnosis not present

## 2016-02-17 DIAGNOSIS — S199XXA Unspecified injury of neck, initial encounter: Secondary | ICD-10-CM | POA: Diagnosis not present

## 2016-02-17 DIAGNOSIS — S42291A Other displaced fracture of upper end of right humerus, initial encounter for closed fracture: Secondary | ICD-10-CM | POA: Insufficient documentation

## 2016-02-17 DIAGNOSIS — M533 Sacrococcygeal disorders, not elsewhere classified: Secondary | ICD-10-CM | POA: Diagnosis not present

## 2016-02-17 DIAGNOSIS — S79911A Unspecified injury of right hip, initial encounter: Secondary | ICD-10-CM | POA: Diagnosis not present

## 2016-02-17 DIAGNOSIS — S32501A Unspecified fracture of right pubis, initial encounter for closed fracture: Secondary | ICD-10-CM | POA: Diagnosis not present

## 2016-02-17 DIAGNOSIS — R030 Elevated blood-pressure reading, without diagnosis of hypertension: Secondary | ICD-10-CM | POA: Insufficient documentation

## 2016-02-17 DIAGNOSIS — M25561 Pain in right knee: Secondary | ICD-10-CM | POA: Diagnosis not present

## 2016-02-17 DIAGNOSIS — T148XXA Other injury of unspecified body region, initial encounter: Secondary | ICD-10-CM | POA: Diagnosis not present

## 2016-02-17 HISTORY — DX: Other specified fracture of unspecified pubis, initial encounter for closed fracture: S32.599A

## 2016-02-17 HISTORY — DX: Unspecified dementia, unspecified severity, without behavioral disturbance, psychotic disturbance, mood disturbance, and anxiety: F03.90

## 2016-02-17 LAB — BASIC METABOLIC PANEL
ANION GAP: 8 (ref 5–15)
BUN: 34 mg/dL — ABNORMAL HIGH (ref 6–20)
CO2: 24 mmol/L (ref 22–32)
Calcium: 9.3 mg/dL (ref 8.9–10.3)
Chloride: 108 mmol/L (ref 101–111)
Creatinine, Ser: 1.17 mg/dL — ABNORMAL HIGH (ref 0.44–1.00)
GFR calc Af Amer: 49 mL/min — ABNORMAL LOW (ref >60)
GFR, EST NON AFRICAN AMERICAN: 42 mL/min — AB (ref >60)
GLUCOSE: 141 mg/dL — AB (ref 65–99)
Potassium: 4.2 mmol/L (ref 3.5–5.1)
Sodium: 140 mmol/L (ref 135–145)

## 2016-02-17 LAB — CBC WITH DIFFERENTIAL/PLATELET
BASOS ABS: 0 10*3/uL (ref 0.0–0.1)
Basophils Relative: 0 %
EOS PCT: 0 %
Eosinophils Absolute: 0 10*3/uL (ref 0.0–0.7)
HEMATOCRIT: 34.8 % — AB (ref 36.0–46.0)
HEMOGLOBIN: 11.7 g/dL — AB (ref 12.0–15.0)
LYMPHS PCT: 4 %
Lymphs Abs: 0.4 10*3/uL — ABNORMAL LOW (ref 0.7–4.0)
MCH: 30.3 pg (ref 26.0–34.0)
MCHC: 33.6 g/dL (ref 30.0–36.0)
MCV: 90.2 fL (ref 78.0–100.0)
Monocytes Absolute: 0.7 10*3/uL (ref 0.1–1.0)
Monocytes Relative: 8 %
NEUTROS ABS: 8.5 10*3/uL — AB (ref 1.7–7.7)
NEUTROS PCT: 88 %
PLATELETS: 102 10*3/uL — AB (ref 150–400)
RBC: 3.86 MIL/uL — AB (ref 3.87–5.11)
RDW: 14.5 % (ref 11.5–15.5)
WBC: 9.6 10*3/uL (ref 4.0–10.5)

## 2016-02-17 MED ORDER — ACETAMINOPHEN 325 MG PO TABS: 650.0000 mg | ORAL_TABLET | Freq: Four times a day (QID) | ORAL | Status: DC | PRN

## 2016-02-17 MED ORDER — SODIUM CHLORIDE 0.45 % IV SOLN
INTRAVENOUS | Status: AC
  Administered 2016-02-17: 12:00:00 via INTRAVENOUS

## 2016-02-17 MED ORDER — ENOXAPARIN SODIUM 30 MG/0.3ML ~~LOC~~ SOLN
30.0000 mg | SUBCUTANEOUS | Status: DC
  Administered 2016-02-17 – 2016-02-18 (×2): 30 mg via SUBCUTANEOUS
  Filled 2016-02-17 (×2): qty 0.3

## 2016-02-17 MED ORDER — MORPHINE SULFATE (PF) 2 MG/ML IV SOLN
2.0000 mg | Freq: Once | INTRAVENOUS | Status: AC
Start: 2016-02-17 — End: 2016-02-17
  Administered 2016-02-17: 2 mg via INTRAVENOUS
  Filled 2016-02-17: qty 1

## 2016-02-17 MED ORDER — ONDANSETRON HCL 4 MG/2ML IJ SOLN: 4.0000 mg | Freq: Four times a day (QID) | INTRAMUSCULAR | Status: DC | PRN

## 2016-02-17 MED ORDER — ONDANSETRON HCL 4 MG PO TABS
4.0000 mg | ORAL_TABLET | Freq: Four times a day (QID) | ORAL | Status: DC | PRN
  Filled 2016-02-17: qty 1

## 2016-02-17 MED ORDER — NITROGLYCERIN 0.4 MG SL SUBL
SUBLINGUAL_TABLET | SUBLINGUAL | Status: AC
  Filled 2016-02-17: qty 1

## 2016-02-17 MED ORDER — ACETAMINOPHEN 650 MG RE SUPP: 650.0000 mg | Freq: Four times a day (QID) | RECTAL | Status: DC | PRN

## 2016-02-17 MED ORDER — ONDANSETRON HCL 4 MG/2ML IJ SOLN
4.0000 mg | Freq: Once | INTRAMUSCULAR | Status: AC
  Administered 2016-02-17: 4 mg via INTRAVENOUS
  Filled 2016-02-17: qty 2

## 2016-02-17 MED ORDER — SENNA 8.6 MG PO TABS
1.0000 | ORAL_TABLET | Freq: Two times a day (BID) | ORAL | Status: DC
  Administered 2016-02-18 – 2016-02-19 (×3): 8.6 mg via ORAL
  Filled 2016-02-17 (×5): qty 1

## 2016-02-17 MED ORDER — HYDRALAZINE HCL 20 MG/ML IJ SOLN
2.0000 mg | Freq: Three times a day (TID) | INTRAMUSCULAR | Status: DC | PRN
  Filled 2016-02-17: qty 1

## 2016-02-17 MED ORDER — OXYCODONE HCL 5 MG PO TABS
5.0000 mg | ORAL_TABLET | ORAL | Status: DC | PRN
  Administered 2016-02-17 – 2016-02-19 (×9): 5 mg via ORAL
  Filled 2016-02-17 (×11): qty 1

## 2016-02-17 NOTE — Progress Notes (Signed)
PT Cancellation Note  Patient Details Name: Yolanda CarwinLoretta Reny MRN: [id number] DOB: 06/09/1931  a Cancelled Treatment:    Reason Eval/Treat Not Completed: Fatigue/lethargy limiting ability to participate (Patient is asleep, RN  does not want to medicate at this time due to lethargy and resting. will return in AM. Daughter plans to be in room. )   Sharen HeckHill, Kierria Feigenbaum Elizabeth Honestii Marton PT [phone]  02/17/2016, 2:03 PM

## 2016-02-17 NOTE — H&P (Signed)
History and Physical    Catalia Massett RUE:[id number] DOB: 08/16/1931  DOA: 02/17/2016 PCP: Georgann Housekeeper, MD  Patient coming from: Home   Chief Complaint: Fall   HPI: Yolanda Shaw is a 83 y.o. female with medical history significant of dementia, presented to the after sliding of the bed a fall about 4 hours ago. Patient report that she just slid of the bed and went to the floor. Patient report right shoulder, hip and back pain is worse with movement. She denies hitting her head or any LOC. She reported that she "stood up right away and called her son because of pain"   Daughter reported that patient's son her the fall and found the patient laying in the floor and called 911 immediately. They report no loss of consciousness. He also reported that this is the second fall in the past week.   ED Course: X-ray shows pelvic fracture right pubis, and right single humerus fracture. Creatinine of 1.17 up from 0.13 December 2015. Orthopedics were consulted and recommended conservative treatment  Review of Systems:   General: no changes in body weight, no fever chills or decrease in energy.  HEENT: no blurry vision, hearing changes or sore throat Respiratory: no dyspnea, coughing, wheezing CV: no chest pain, no palpitations GI: no nausea, vomiting, abdominal pain, diarrhea, constipation GU: no dysuria, burning on urination, increased urinary frequency, hematuria  Ext:. No deformities,  Neuro: no unilateral weakness, numbness, or tingling, no vision change or hearing loss Skin: No rashes, lesions or wounds. MSK:  see history of present illness Heme: No easy bruising.   History reviewed. No pertinent past medical history.  Past Surgical History:  Procedure Laterality Date  . ADENOIDECTOMY    . SHOULDER SURGERY     right  . TONSILLECTOMY    . WRIST SURGERY       reports that she quit smoking about 69 years ago. She has never used smokeless tobacco. She reports that she drinks alcohol. She  reports that she does not use drugs.  Allergies  Allergen Reactions  . Ether Other (See Comments)    Hard to wake up after procedure  . Amitriptyline Rash  . Penicillins Rash    Has patient had a PCN reaction causing immediate rash, facial/tongue/throat swelling, SOB or lightheadedness with hypotension: No Has patient had a PCN reaction causing severe rash involving mucus membranes or skin necrosis: No Has patient had a PCN reaction that required hospitalization No Has patient had a PCN reaction occurring within the last 10 years: No If all of the above answers are "NO", then may proceed with Cephalosporin use.    Family history reviewed and non-pertinent   Prior to Admission medications   Medication Sig Start Date End Date Taking? Authorizing Provider  aspirin 325 MG EC tablet Take 325 mg by mouth every 6 (six) hours as needed for pain.   Yes Historical Provider, MD    Physical Exam: Vitals:   02/17/16 0600 02/17/16 0630 02/17/16 0730 02/17/16 0805  BP: 105/70 101/58 (!) 161/107 (!) 152/129  Pulse: 93 88 88 (!) 125  Resp:   18 16  Temp:    98.6 F (37 C)  TempSrc:      SpO2: 100% 100% 100% 90%     Constitutional: NAD, pleasant  Eyes: PERRL ENMT: Mucous membranes are moist.  Neck: normal, supple, no masses, no thyromegaly Respiratory: clear to auscultation bilaterally, no wheezing, no crackles. Cardiovascular: S1S2 RRR no murmurs  Abdomen: Soft NTND + Bowel sounds  Musculoskeletal: Strength 5/5 all extremities. Difficulty with R shoulder extension due to pain. Pain with flexion and external rotation of the R hip. Sensation intact  Skin: no rashes, lesions, ulcers.  Neurologic: CN 2-12 grossly intact. Short term memory impair  Psychiatric: AAOx3   Labs on Admission: I have personally reviewed following labs and imaging studies  CBC:  Recent Labs Lab 02/17/16 0510  WBC 9.6  NEUTROABS 8.5*  HGB 11.7*  HCT 34.8*  MCV 90.2  PLT 102*   Basic Metabolic  Panel:  Recent Labs Lab 02/17/16 0510  NA 140  K 4.2  CL 108  CO2 24  GLUCOSE 141*  BUN 34*  CREATININE 1.17*  CALCIUM 9.3   Urine analysis:    Component Value Date/Time   COLORURINE YELLOW 01/01/2016 1420   APPEARANCEUR CLEAR 01/01/2016 1420   LABSPEC 1.014 01/01/2016 1420   PHURINE 5.0 01/01/2016 1420   GLUCOSEU NEGATIVE 01/01/2016 1420   HGBUR MODERATE (A) 01/01/2016 1420   BILIRUBINUR NEGATIVE 01/01/2016 1420   KETONESUR 5 (A) 01/01/2016 1420   PROTEINUR NEGATIVE 01/01/2016 1420   UROBILINOGEN 0.2 01/03/2013 1701   NITRITE NEGATIVE 01/01/2016 1420   LEUKOCYTESUR NEGATIVE 01/01/2016 1420    Radiological Exams on Admission: Dg Shoulder Right  Result Date: 02/17/2016 CLINICAL DATA:  Fall this evening. History of prior falls and dementia. Right shoulder pain. EXAM: RIGHT SHOULDER - 2+ VIEW COMPARISON:  Chest radiograph 01/03/2013 FINDINGS: Prominent degenerative changes in the right shoulder involving the glenohumeral and acromioclavicular joints. Loss of subacromial space suggests chronic rotator cuff arthropathy. The right humeral neck appears angulated, possibly due to old fracture deformity or acute impacted fracture. Soft tissues are unremarkable. IMPRESSION: Degenerative changes in the right shoulder with chronic rotator cuff arthropathy changes. Angulation at the humeral neck suggests old fracture deformity versus acute impacted fracture. Electronically Signed   By: Burman NievesWilliam  Stevens M.D.   On: 02/17/2016 04:23   Ct Head Wo Contrast  Result Date: 02/17/2016 CLINICAL DATA:  83 y/o F; status post fall with pain to the right-sided neck. EXAM: CT HEAD WITHOUT CONTRAST CT CERVICAL SPINE WITHOUT CONTRAST TECHNIQUE: Multidetector CT imaging of the head and cervical spine was performed following the standard protocol without intravenous contrast. Multiplanar CT image reconstructions of the cervical spine were also generated. COMPARISON:  10/08/2015 CT of the head. FINDINGS: CT  HEAD FINDINGS Brain: No evidence of acute infarction, hemorrhage, hydrocephalus, extra-axial collection or mass lesion/mass effect. Left frontal lucency appears related to be streak artifact on reconstructions. Mild brain parenchymal volume loss and chronic microvascular ischemic changes for age. Vascular: Mild calcific atherosclerosis of cavernous internal carotid arteries. Skull: Calcified nodule of the scalp near the vertex present on prior CT of head, probably a dermal appendage cyst. No displaced calvarial fracture. Sinuses/Orbits: No acute finding. Bilateral intra-ocular lens replacement. Other: None. CT CERVICAL SPINE FINDINGS Alignment: Exaggerated cervical lordosis.  No significant listhesis. Skull base and vertebrae: No acute fracture. No primary bone lesion or focal pathologic process. Soft tissues and spinal canal: No prevertebral fluid or swelling. No visible canal hematoma. Disc levels: Cervical spondylosis with discogenic changes greatest at the C5 through C7 level and multilevel facet arthrosis. No high-grade bony canal stenosis. Uncovertebral and facet hypertrophy results in multiple levels of foraminal narrowing greatest at the C5-6 level. Upper chest: Multiple scattered 2-3 mm nodules in the lung apices. Other: Left thyroid nodules measuring up to 30 mm. Calcific atherosclerosis of carotid bifurcations. IMPRESSION: 1. No acute intracranial abnormality identified. 2. No acute fracture  or dislocation of the cervical spine. 3. Stable mild parenchymal volume loss and chronic microvascular ischemic changes of the brain. 4. Cervical spondylosis predominantly from the C5 through C7 levels. No high-grade bony canal stenosis. 5. Left thyroid nodules measuring up to 30 mm. Further evaluation with thyroid ultrasound is recommended on a nonemergent basis. 6. Several 2-3 mm pulmonary nodules in the lung apices. No follow-up needed if patient is low-risk (and has no known or suspected primary neoplasm).  Non-contrast chest CT can be considered in 12 months if patient is high-risk. This recommendation follows the consensus statement: Guidelines for Management of Incidental Pulmonary Nodules Detected on CT Images: From the Fleischner Society 2017; Radiology 2017; 284:228-243. Electronically Signed   By: Mitzi Hansen M.D.   On: 02/17/2016 04:43   Ct Cervical Spine Wo Contrast  Result Date: 02/17/2016 CLINICAL DATA:  83 y/o F; status post fall with pain to the right-sided neck. EXAM: CT HEAD WITHOUT CONTRAST CT CERVICAL SPINE WITHOUT CONTRAST TECHNIQUE: Multidetector CT imaging of the head and cervical spine was performed following the standard protocol without intravenous contrast. Multiplanar CT image reconstructions of the cervical spine were also generated. COMPARISON:  10/08/2015 CT of the head. FINDINGS: CT HEAD FINDINGS Brain: No evidence of acute infarction, hemorrhage, hydrocephalus, extra-axial collection or mass lesion/mass effect. Left frontal lucency appears related to be streak artifact on reconstructions. Mild brain parenchymal volume loss and chronic microvascular ischemic changes for age. Vascular: Mild calcific atherosclerosis of cavernous internal carotid arteries. Skull: Calcified nodule of the scalp near the vertex present on prior CT of head, probably a dermal appendage cyst. No displaced calvarial fracture. Sinuses/Orbits: No acute finding. Bilateral intra-ocular lens replacement. Other: None. CT CERVICAL SPINE FINDINGS Alignment: Exaggerated cervical lordosis.  No significant listhesis. Skull base and vertebrae: No acute fracture. No primary bone lesion or focal pathologic process. Soft tissues and spinal canal: No prevertebral fluid or swelling. No visible canal hematoma. Disc levels: Cervical spondylosis with discogenic changes greatest at the C5 through C7 level and multilevel facet arthrosis. No high-grade bony canal stenosis. Uncovertebral and facet hypertrophy results in  multiple levels of foraminal narrowing greatest at the C5-6 level. Upper chest: Multiple scattered 2-3 mm nodules in the lung apices. Other: Left thyroid nodules measuring up to 30 mm. Calcific atherosclerosis of carotid bifurcations. IMPRESSION: 1. No acute intracranial abnormality identified. 2. No acute fracture or dislocation of the cervical spine. 3. Stable mild parenchymal volume loss and chronic microvascular ischemic changes of the brain. 4. Cervical spondylosis predominantly from the C5 through C7 levels. No high-grade bony canal stenosis. 5. Left thyroid nodules measuring up to 30 mm. Further evaluation with thyroid ultrasound is recommended on a nonemergent basis. 6. Several 2-3 mm pulmonary nodules in the lung apices. No follow-up needed if patient is low-risk (and has no known or suspected primary neoplasm). Non-contrast chest CT can be considered in 12 months if patient is high-risk. This recommendation follows the consensus statement: Guidelines for Management of Incidental Pulmonary Nodules Detected on CT Images: From the Fleischner Society 2017; Radiology 2017; 284:228-243. Electronically Signed   By: Mitzi Hansen M.D.   On: 02/17/2016 04:43   Dg Knee Complete 4 Views Right  Result Date: 02/17/2016 CLINICAL DATA:  Pain after a fall this evening. EXAM: RIGHT KNEE - COMPLETE 4+ VIEW COMPARISON:  None. FINDINGS: Degenerative changes in the right knee. Slight contour irregularities on the femoral condyles likely representing osteochondral defects. No evidence of acute fracture or dislocation. No significant effusion. Soft tissues  are unremarkable. IMPRESSION: No acute bony abnormalities. Degenerative changes in the knee with probable osteochondral defects. Electronically Signed   By: Burman Nieves M.D.   On: 02/17/2016 04:26   Dg Hip Unilat W Or Wo Pelvis 2-3 Views Right  Result Date: 02/17/2016 CLINICAL DATA:  Pain after a fall. EXAM: DG HIP (WITH OR WITHOUT PELVIS) 2-3V RIGHT  COMPARISON:  06/29/2014 FINDINGS: Displaced fracture of the right superior and inferior pubic rami, new since previous study, and probably acute. Old fracture deformities of the left pubic rami. Degenerative changes in the hips. SI joints and symphysis pubis are not displaced. No evidence of fracture or dislocation of the right hip. IMPRESSION: Acute appearing displaced fractures involving the superior and inferior right pubic rami. Old fracture deformities of the left pubic rami. Degenerative changes in the hips. Electronically Signed   By: Burman Nieves M.D.   On: 02/17/2016 04:25    Assessment/Plan Closed multiple fractures of right pubic rami R side proximal humerus fracture  Patient been admitted due to unable to ambulate and pain control.  Admit to medsurg  Ortho consulted recommendation appreciated  PT eval - WBAT  Sling for R shoulder  Pain control as need   AKI - Baseline Cr 0.8 12/2015 - Cr on admission 1.17 , daughter report that patient does not drink enough liquids during the day. Will run IVF over 8 hrs  Encourage oral hydration  Check Cr in AM   Elevated BP - likely pain related  Monitor  Pain control  Hydralazine PRN for SBP > 160 or DBP >100 Will get an EKG   Dementia - mentally at baseline, not taking any medications  Patient at high risk for sundowning - will add haldol PRN  Monitor    DVT prophylaxis: Lovenox  Code Status: FULL  Family Communication: Daughter at bedside  Disposition Plan: Anticipate discharge to previous home environment.  Consults called: EDP consulted Orthopedics surgery  Admission status: Ob's medsurg    Latrelle Dodrill MD [facility] Pager: Text Page via [email or web address]  [phone]  If 7PM-7AM, please contact night-coverage [email or web address] Password [email or web address]  02/17/2016, 8:25 AM

## 2016-02-17 NOTE — ED Notes (Signed)
Bed: WA04 Expected date:  Expected time:  Means of arrival:  Comments: [id number] fall out of bed tailbone and shoulder pian.

## 2016-02-17 NOTE — ED Triage Notes (Signed)
Per EMS pt from home. Experienced fall this evening trying to get out of bed. Hx of falls, and Dementia. C/o R tailbone and R. Shoulder blade pain. Per son pt fell approx 1 year ago and broke her coccyx. Denies LOC or hitting head. Pt AO to self.

## 2016-02-17 NOTE — ED Provider Notes (Addendum)
[facility]-EMERGENCY DEPT Provider Note   CSN: [id number] Arrival date & time: 02/17/16  [id number]  By signing my name below, I, Elder NegusRussell Johnston, attest that this documentation has been prepared under the direction and in the presence of Gilda Creasehristopher J Tawni Melkonian, MD. Electronically Signed: Elder Negusussell Johnston, Scribe. 02/17/16. 3:56 AM.   History   Chief Complaint Chief Complaint  Patient presents with  . Fall   LEVEL 5 CAVEAT DUE TO DEMENTIA.   HPI Yolanda Shaw is a 83 y.o. female with history of dementia who presents to the ED for evaluation following a fall. This patient states that she was in bed when she fell and struck the floor "against her R side". At interview, she is reporting R sided neck pain, R "shoulder blade" pain, R hip pain, and R knee pain. She denies any head, chest, or abdominal pain. She amnesic to the events of her fall and cannot recall any LOC.   A complete history is limited by dementia.   HPI  History reviewed. No pertinent past medical history.  Patient Active Problem List   Diagnosis Date Noted  . Cramping affecting pregnancy, antepartum 11/20/2014  . Pelvic fracture (HCC) 06/30/2014  . Thrombocytopenia (HCC) 06/30/2014  . Tremor 06/30/2014  . Pressure ulcer 06/30/2014  . Fracture of left pubis (HCC)   . Ischium fracture (HCC)   . Left hip pain   . Bilateral pubic rami fractures (HCC) 06/29/2014    Past Surgical History:  Procedure Laterality Date  . ADENOIDECTOMY    . SHOULDER SURGERY     right  . TONSILLECTOMY    . WRIST SURGERY      OB History    No data available       Home Medications    Prior to Admission medications   Medication Sig Start Date End Date Taking? Authorizing Provider  acetaminophen (TYLENOL) 500 MG tablet Take 2 tablets (1,000 mg total) by mouth every 8 (eight) hours as needed. Patient not taking: Reported on 01/01/2016 07/01/14   Maretta BeesShanker M Ghimire, MD  aspirin 325 MG EC tablet Take 325 mg by mouth every 6 (six) hours as  needed for pain.    Historical Provider, MD    Family History No family history on file.  Social History Social History  Substance Use Topics  . Smoking status: Former Smoker    Quit date: 01/01/1947  . Smokeless tobacco: Never Used  . Alcohol use Yes     Comment: occassional wine     Allergies   Ether; Amitriptyline; and Penicillins   Review of Systems Review of Systems  Unable to perform ROS: Dementia     Physical Exam Updated Vital Signs BP 154/78 (BP Location: Right Arm)   Pulse 98   Temp 97.9 F (36.6 C) (Oral)   Resp 18   SpO2 98%   Physical Exam  Constitutional: She appears well-developed and well-nourished. No distress.  HENT:  Head: Normocephalic and atraumatic.  Right Ear: Hearing normal.  Left Ear: Hearing normal.  Nose: Nose normal.  Mouth/Throat: Oropharynx is clear and moist and mucous membranes are normal.  Eyes: Conjunctivae and EOM are normal. Pupils are equal, round, and reactive to light.  Neck: Normal range of motion. Neck supple.  Cardiovascular: Regular rhythm, S1 normal and S2 normal.  Exam reveals no gallop and no friction rub.   No murmur heard. Pulmonary/Chest: Effort normal and breath sounds normal. No respiratory distress. She exhibits no tenderness.  Abdominal: Soft. Normal appearance and bowel sounds are normal.  There is no hepatosplenomegaly. There is no tenderness. There is no rebound, no guarding, no tenderness at McBurney's point and negative Murphy's sign. No hernia.  Musculoskeletal:  There is pain when ranging the R hip, tenderness to the R knee, and R paracervical tenderness.   Pain with range of motion of the right shoulder, no obvious deformity  Neurological: She is alert. She has normal strength. No cranial nerve deficit or sensory deficit. Coordination normal. GCS eye subscore is 4. GCS verbal subscore is 5. GCS motor subscore is 6.  Skin: Skin is warm, dry and intact. No rash noted. No cyanosis.  Psychiatric: She has a  normal mood and affect. Her speech is normal and behavior is normal. Thought content normal.  Nursing note and vitals reviewed.    ED Treatments / Results  Labs (all labs ordered are listed, but only abnormal results are displayed) Labs Reviewed  CBC WITH DIFFERENTIAL/PLATELET  BASIC METABOLIC PANEL    EKG  EKG Interpretation None       Radiology Dg Shoulder Right  Result Date: 02/17/2016 CLINICAL DATA:  Fall this evening. History of prior falls and dementia. Right shoulder pain. EXAM: RIGHT SHOULDER - 2+ VIEW COMPARISON:  Chest radiograph 01/03/2013 FINDINGS: Prominent degenerative changes in the right shoulder involving the glenohumeral and acromioclavicular joints. Loss of subacromial space suggests chronic rotator cuff arthropathy. The right humeral neck appears angulated, possibly due to old fracture deformity or acute impacted fracture. Soft tissues are unremarkable. IMPRESSION: Degenerative changes in the right shoulder with chronic rotator cuff arthropathy changes. Angulation at the humeral neck suggests old fracture deformity versus acute impacted fracture. Electronically Signed   By: Burman Nieves M.D.   On: 02/17/2016 04:23   Ct Head Wo Contrast  Result Date: 02/17/2016 CLINICAL DATA:  83 y/o F; status post fall with pain to the right-sided neck. EXAM: CT HEAD WITHOUT CONTRAST CT CERVICAL SPINE WITHOUT CONTRAST TECHNIQUE: Multidetector CT imaging of the head and cervical spine was performed following the standard protocol without intravenous contrast. Multiplanar CT image reconstructions of the cervical spine were also generated. COMPARISON:  10/08/2015 CT of the head. FINDINGS: CT HEAD FINDINGS Brain: No evidence of acute infarction, hemorrhage, hydrocephalus, extra-axial collection or mass lesion/mass effect. Left frontal lucency appears related to be streak artifact on reconstructions. Mild brain parenchymal volume loss and chronic microvascular ischemic changes for age.  Vascular: Mild calcific atherosclerosis of cavernous internal carotid arteries. Skull: Calcified nodule of the scalp near the vertex present on prior CT of head, probably a dermal appendage cyst. No displaced calvarial fracture. Sinuses/Orbits: No acute finding. Bilateral intra-ocular lens replacement. Other: None. CT CERVICAL SPINE FINDINGS Alignment: Exaggerated cervical lordosis.  No significant listhesis. Skull base and vertebrae: No acute fracture. No primary bone lesion or focal pathologic process. Soft tissues and spinal canal: No prevertebral fluid or swelling. No visible canal hematoma. Disc levels: Cervical spondylosis with discogenic changes greatest at the C5 through C7 level and multilevel facet arthrosis. No high-grade bony canal stenosis. Uncovertebral and facet hypertrophy results in multiple levels of foraminal narrowing greatest at the C5-6 level. Upper chest: Multiple scattered 2-3 mm nodules in the lung apices. Other: Left thyroid nodules measuring up to 30 mm. Calcific atherosclerosis of carotid bifurcations. IMPRESSION: 1. No acute intracranial abnormality identified. 2. No acute fracture or dislocation of the cervical spine. 3. Stable mild parenchymal volume loss and chronic microvascular ischemic changes of the brain. 4. Cervical spondylosis predominantly from the C5 through C7 levels. No high-grade bony canal  stenosis. 5. Left thyroid nodules measuring up to 30 mm. Further evaluation with thyroid ultrasound is recommended on a nonemergent basis. 6. Several 2-3 mm pulmonary nodules in the lung apices. No follow-up needed if patient is low-risk (and has no known or suspected primary neoplasm). Non-contrast chest CT can be considered in 12 months if patient is high-risk. This recommendation follows the consensus statement: Guidelines for Management of Incidental Pulmonary Nodules Detected on CT Images: From the Fleischner Society 2017; Radiology 2017; 284:228-243. Electronically Signed   By:  Mitzi Hansen M.D.   On: 02/17/2016 04:43   Ct Cervical Spine Wo Contrast  Result Date: 02/17/2016 CLINICAL DATA:  83 y/o F; status post fall with pain to the right-sided neck. EXAM: CT HEAD WITHOUT CONTRAST CT CERVICAL SPINE WITHOUT CONTRAST TECHNIQUE: Multidetector CT imaging of the head and cervical spine was performed following the standard protocol without intravenous contrast. Multiplanar CT image reconstructions of the cervical spine were also generated. COMPARISON:  10/08/2015 CT of the head. FINDINGS: CT HEAD FINDINGS Brain: No evidence of acute infarction, hemorrhage, hydrocephalus, extra-axial collection or mass lesion/mass effect. Left frontal lucency appears related to be streak artifact on reconstructions. Mild brain parenchymal volume loss and chronic microvascular ischemic changes for age. Vascular: Mild calcific atherosclerosis of cavernous internal carotid arteries. Skull: Calcified nodule of the scalp near the vertex present on prior CT of head, probably a dermal appendage cyst. No displaced calvarial fracture. Sinuses/Orbits: No acute finding. Bilateral intra-ocular lens replacement. Other: None. CT CERVICAL SPINE FINDINGS Alignment: Exaggerated cervical lordosis.  No significant listhesis. Skull base and vertebrae: No acute fracture. No primary bone lesion or focal pathologic process. Soft tissues and spinal canal: No prevertebral fluid or swelling. No visible canal hematoma. Disc levels: Cervical spondylosis with discogenic changes greatest at the C5 through C7 level and multilevel facet arthrosis. No high-grade bony canal stenosis. Uncovertebral and facet hypertrophy results in multiple levels of foraminal narrowing greatest at the C5-6 level. Upper chest: Multiple scattered 2-3 mm nodules in the lung apices. Other: Left thyroid nodules measuring up to 30 mm. Calcific atherosclerosis of carotid bifurcations. IMPRESSION: 1. No acute intracranial abnormality identified. 2. No  acute fracture or dislocation of the cervical spine. 3. Stable mild parenchymal volume loss and chronic microvascular ischemic changes of the brain. 4. Cervical spondylosis predominantly from the C5 through C7 levels. No high-grade bony canal stenosis. 5. Left thyroid nodules measuring up to 30 mm. Further evaluation with thyroid ultrasound is recommended on a nonemergent basis. 6. Several 2-3 mm pulmonary nodules in the lung apices. No follow-up needed if patient is low-risk (and has no known or suspected primary neoplasm). Non-contrast chest CT can be considered in 12 months if patient is high-risk. This recommendation follows the consensus statement: Guidelines for Management of Incidental Pulmonary Nodules Detected on CT Images: From the Fleischner Society 2017; Radiology 2017; 284:228-243. Electronically Signed   By: Mitzi Hansen M.D.   On: 02/17/2016 04:43   Dg Knee Complete 4 Views Right  Result Date: 02/17/2016 CLINICAL DATA:  Pain after a fall this evening. EXAM: RIGHT KNEE - COMPLETE 4+ VIEW COMPARISON:  None. FINDINGS: Degenerative changes in the right knee. Slight contour irregularities on the femoral condyles likely representing osteochondral defects. No evidence of acute fracture or dislocation. No significant effusion. Soft tissues are unremarkable. IMPRESSION: No acute bony abnormalities. Degenerative changes in the knee with probable osteochondral defects. Electronically Signed   By: Burman Nieves M.D.   On: 02/17/2016 04:26   Dg Hip  Unilat W Or Wo Pelvis 2-3 Views Right  Result Date: 02/17/2016 CLINICAL DATA:  Pain after a fall. EXAM: DG HIP (WITH OR WITHOUT PELVIS) 2-3V RIGHT COMPARISON:  06/29/2014 FINDINGS: Displaced fracture of the right superior and inferior pubic rami, new since previous study, and probably acute. Old fracture deformities of the left pubic rami. Degenerative changes in the hips. SI joints and symphysis pubis are not displaced. No evidence of fracture or  dislocation of the right hip. IMPRESSION: Acute appearing displaced fractures involving the superior and inferior right pubic rami. Old fracture deformities of the left pubic rami. Degenerative changes in the hips. Electronically Signed   By: Burman Nieves M.D.   On: 02/17/2016 04:25    Procedures Procedures (including critical care time)  Medications Ordered in ED Medications  morphine 2 MG/ML injection 2 mg (not administered)  ondansetron (ZOFRAN) injection 4 mg (not administered)     Initial Impression / Assessment and Plan / ED Course  I have reviewed the triage vital signs and the nursing notes.  Pertinent labs & imaging results that were available during my care of the patient were reviewed by me and considered in my medical decision making (see chart for details).     Patient presents to the emergency department for evaluation after a fall. Patient asked had 2 falls overnight. The first time she refused transport, was brought to the ER from home after the second fall. Patient complaining of pain on the right side. She reports right hip area pain. She also is complaining of pain in the right knee, right shoulder area. He was unclear if she had hit her head, she appears to be confused at arrival, reportedly her baseline mental status. CT head and cervical spine performed because of confusion and some neck tenderness. No acute abnormality noted. X-ray of right shoulder suspicious for impacted fracture. She also has acute inferior and superior right pubic rami fracture which explain the patient's right hip area pain. No femoral fracture noted. Patient unable to stand or get out of bed secondary to pain from the fractures. Will require hospitalization.  Addendum: discussed with Dr. Magnus Ivan, orthopedics - patient will be seen in consultation.  Final Clinical Impressions(s) / ED Diagnoses   Final diagnoses:  Closed stable fracture of multiple pubic rami (HCC)  Closed fracture of  proximal end of right humerus, unspecified fracture morphology, initial encounter    New Prescriptions New Prescriptions   No medications on file  I personally performed the services described in this documentation, which was scribed in my presence. The recorded information has been reviewed and is accurate.    Gilda Crease, MD 02/17/16 [id number]    Gilda Crease, MD 02/17/16 [phone]

## 2016-02-17 NOTE — Evaluation (Signed)
Occupational Therapy Evaluation Patient Details Name: Mahlia Fernando MRN: [id number] DOB: 1931/06/21 Today's Date: 02/17/2016    History of Present Illness Pt fell and has right-sided pubic rami fractures and a right proximal humerus fracture.  Per MD both be treated conservatively with mobility and weight-bearing as tolerated- and pt can use a walker with RUE   Clinical Impression   Pt admitted after a fall with R pubic fx as well as R humerus fx.. Pt currently with functional limitations due to the deficits listed below (see OT Problem List).  Pt will benefit from skilled OT to increase their safety and independence with ADL and functional mobility for ADL to facilitate discharge to venue listed below.      Follow Up Recommendations  SNF    Equipment Recommendations  None recommended by OT       Precautions / Restrictions Precautions Precaution Comments: pt is WBAT RUE and RLE      Mobility Bed Mobility Overal bed mobility: Needs Assistance Bed Mobility: Supine to Sit     Supine to sit: Max assist     General bed mobility comments: pt unable to reach sitting position due to pain.  pt returned back to comfortable position in bed  Transfers                 General transfer comment: did not perform         ADL Overall ADL's : Needs assistance/impaired Eating/Feeding: Minimal assistance;Bed level   Grooming: Minimal assistance;Bed level                                 General ADL Comments: pt not able to get to EOB this OT visit. Pt had pain meds prior to OT session.  RN aware. Pt will likely need ST SNF                Pertinent Vitals/Pain Pain Assessment: Faces Faces Pain Scale: Hurts whole lot Pain Location: R hip area with bed mobility Pain Descriptors / Indicators: Guarding;Aching;Discomfort;Grimacing Pain Intervention(s): Monitored during session;Limited activity within patient's tolerance;Premedicated before session;Repositioned      Hand Dominance     Extremity/Trunk Assessment Upper Extremity Assessment Upper Extremity Assessment: RUE deficits/detail;Overall [facility] for tasks assessed RUE Deficits / Details: R humeral fx. Per MD note pt can use RUE as well as use walker              Cognition     Overall Cognitive Status: History of cognitive impairments - at baseline                     General Comments   son and daughter present for OT session            Home Living Family/patient expects to be discharged to:: Private residence Living Arrangements: Children Available Help at Discharge: Family Type of Home: House Home Access: Stairs to enter Secretary/administrator of Steps: 1   Home Layout: One level     Bathroom Shower/Tub: Tub/shower unit         Home Equipment: Environmental consultant - 2 wheels;Bedside commode;Tub bench;Transport chair          Prior Functioning/Environment Level of Independence: Independent with assistive device(s)        Comments: cane        OT Problem List: Decreased strength;Decreased activity tolerance;Pain;Decreased safety awareness;Decreased knowledge of use of DME or AE;Decreased knowledge  of precautions;Decreased cognition   OT Treatment/Interventions: Self-care/ADL training;DME and/or AE instruction;Patient/family education    OT Goals(Current goals can be found in the care plan section) Acute Rehab OT Goals Patient Stated Goal: get stonger to be able to go home OT Goal Formulation: With patient/family Time For Goal Achievement: 03/02/16 Potential to Achieve Goals: Good  OT Frequency: Min 2X/week           End of Session Nurse Communication: Mobility status  Activity Tolerance: Patient limited by pain Patient left: in bed;with call bell/phone within reach;with family/visitor present   Time: [id number] OT Time Calculation (min): 24 min Charges:  OT General Charges $OT Visit: 1 Procedure OT Evaluation $OT Eval Moderate Complexity: 1  Procedure OT Treatments $Self Care/Home Management : 8-22 mins G-Codes: OT G-codes **NOT FOR INPATIENT CLASS** Functional Assessment Tool Used: clinical observation Functional Limitation: Self care Self Care Current Status (N8295(G8987): At least 80 percent but less than 100 percent impaired, limited or restricted Self Care Goal Status (A2130(G8988): At least 1 percent but less than 20 percent impaired, limited or restricted  Aaliyah Gavel, [place] KungLorraine D 02/17/2016, 12:07 PM

## 2016-02-17 NOTE — Progress Notes (Signed)
Patient ID: Yolanda Shaw, female   DOB: 11/18/1931, 83 y.o.   MRN: [id number] I have reviewed the x-rays of the patient's pelvis and right shoulder.  She has right-sided pubic rami fractures and a right proximal humerus fracture.  These can both be treated conservatively with mobility and weight-bearing as tolerated.  A sling can be placed around her right shoulder for comfort, but she can use a walker as needed with that arm.

## 2016-02-18 DIAGNOSIS — F0391 Unspecified dementia with behavioral disturbance: Secondary | ICD-10-CM | POA: Diagnosis not present

## 2016-02-18 DIAGNOSIS — S32599A Other specified fracture of unspecified pubis, initial encounter for closed fracture: Secondary | ICD-10-CM | POA: Diagnosis not present

## 2016-02-18 DIAGNOSIS — M25551 Pain in right hip: Secondary | ICD-10-CM

## 2016-02-18 DIAGNOSIS — S42294D Other nondisplaced fracture of upper end of right humerus, subsequent encounter for fracture with routine healing: Secondary | ICD-10-CM | POA: Diagnosis not present

## 2016-02-18 DIAGNOSIS — N179 Acute kidney failure, unspecified: Secondary | ICD-10-CM | POA: Diagnosis not present

## 2016-02-18 DIAGNOSIS — S32591A Other specified fracture of right pubis, initial encounter for closed fracture: Secondary | ICD-10-CM | POA: Diagnosis not present

## 2016-02-18 DIAGNOSIS — M25511 Pain in right shoulder: Secondary | ICD-10-CM

## 2016-02-18 DIAGNOSIS — S42201A Unspecified fracture of upper end of right humerus, initial encounter for closed fracture: Secondary | ICD-10-CM | POA: Diagnosis not present

## 2016-02-18 LAB — BASIC METABOLIC PANEL
Anion gap: 7 (ref 5–15)
BUN: 23 mg/dL — AB (ref 6–20)
CO2: 25 mmol/L (ref 22–32)
CREATININE: 0.89 mg/dL (ref 0.44–1.00)
Calcium: 8.5 mg/dL — ABNORMAL LOW (ref 8.9–10.3)
Chloride: 107 mmol/L (ref 101–111)
GFR, EST NON AFRICAN AMERICAN: 59 mL/min — AB (ref >60)
Glucose, Bld: 106 mg/dL — ABNORMAL HIGH (ref 65–99)
POTASSIUM: 3.9 mmol/L (ref 3.5–5.1)
SODIUM: 139 mmol/L (ref 135–145)

## 2016-02-18 MED ORDER — HALOPERIDOL LACTATE 5 MG/ML IJ SOLN: 0.2500 mg | Freq: Four times a day (QID) | INTRAMUSCULAR | Status: DC | PRN

## 2016-02-18 MED ORDER — LORAZEPAM 1 MG PO TABS
1.0000 mg | ORAL_TABLET | Freq: Once | ORAL | Status: AC
  Administered 2016-02-18: 1 mg via ORAL
  Filled 2016-02-18: qty 1

## 2016-02-18 MED ORDER — ACETAMINOPHEN 325 MG PO TABS
650.0000 mg | ORAL_TABLET | Freq: Three times a day (TID) | ORAL | Status: DC
  Administered 2016-02-19 (×3): 650 mg via ORAL
  Filled 2016-02-18 (×3): qty 2

## 2016-02-18 NOTE — Evaluation (Signed)
Physical Therapy Evaluation Patient Details Name: Yolanda Shaw MRN: [id number] DOB: 1931/05/12 Today's Date: 02/18/2016   History of Present Illness  Pt fell and has right-sided pubic rami fractures and a right proximal humerus fracture.  Per MD both be treated conservatively with mobility and weight-bearing as tolerated- and pt can use a walker with RUE  Clinical Impression  The  Patient is very lethargic,possibly due to medication during the night. The family is present. /Patient requires 2 assist at present for mobility. Not able  To ambulate this visit due to lethargy. Pt admitted with above diagnosis. Pt currently with functional limitations due to the deficits listed below (see PT Problem List).  Pt will benefit from skilled PT to increase their independence and safety with mobility to allow discharge to the venue listed below.       Follow Up Recommendations Home health PT;SNF;Supervision/Assistance - 24 hour    Equipment Recommendations  None recommended by PT    Recommendations for Other Services       Precautions / Restrictions Precautions Precautions: Fall Precaution Comments: pt is WBAT RUE and RLE, OK to use RW.      Mobility  Bed Mobility Overal bed mobility: Needs Assistance Bed Mobility: Supine to Sit     Supine to sit: +2 for safety/equipment;+2 for physical assistance;Max assist     General bed mobility comments: assited with legs and trunk with total asist to sit at bed edge.  Transfers Overall transfer level: Needs assistance Equipment used: Rolling walker (2 wheeled) Transfers: Sit to/from [employer] Sit to Stand: Max assist;+2 physical assistance;+2 safety/equipment Stand pivot transfers: Max assist;+2 physical assistance;+2 safety/equipment       General transfer comment: poor ability to take a few steps, literally holding the patient up with 2 max assist with pivot to  [place] then used RW to recliner but patient unable to utize to get  to the recliner.  Ambulation/Gait                Stairs            Wheelchair Mobility    Modified Rankin (Stroke Patients Only)       Balance Overall balance assessment: History of Falls;Needs assistance Sitting-balance support: Feet supported Sitting balance-Leahy Scale: Poor     Standing balance support: During functional activity;Bilateral upper extremity supported Standing balance-Leahy Scale: Zero                               Pertinent Vitals/Pain Pain Assessment: Faces Faces Pain Scale: Hurts whole lot Pain Location: pt complained of pain sitting in chair but did not rate Pain Descriptors / Indicators: Guarding;Aching;Discomfort;Grimacing Pain Intervention(s): Monitored during session;RN gave pain meds during session    Home Living Family/patient expects to be discharged to:: Private residence Living Arrangements: Children Available Help at Discharge: Family Type of Home: House Home Access: Stairs to enter   Secretary/administrator of Steps: 1 Home Layout: One level Home Equipment: Environmental consultant - 2 wheels;Bedside commode;Tub bench;Transport chair      Prior Function Level of Independence: Independent with assistive device(s)         Comments: cane     Hand Dominance        Extremity/Trunk Assessment   Upper Extremity Assessment Upper Extremity Assessment: Defer to OT evaluation    Lower Extremity Assessment Lower Extremity Assessment: RLE deficits/detail;LLE deficits/detail RLE Deficits / Details: decreased weight bear on the right .  knee is flexed in standing LLE Deficits / Details: knee is flexed in standing       Communication   Communication: Expressive difficulties;Receptive difficulties (not following)  Cognition Arousal/Alertness: Lethargic (Had Ativan overnight) Behavior During Therapy: Restless Overall Cognitive Status: Impaired/Different from baseline Area of Impairment: Following commands;Awareness          Safety/Judgement: Decreased awareness of deficits     General Comments: the patient is not really following any commands, requires hand over hand tactile/multimodal cues,.    General Comments      Exercises     Assessment/Plan    PT Assessment Patient needs continued PT services  PT Problem List Decreased strength;Decreased range of motion;Decreased activity tolerance;Decreased mobility;Decreased balance;Decreased knowledge of precautions;Decreased safety awareness;Decreased knowledge of use of DME          PT Treatment Interventions DME instruction;Gait training;Functional mobility training;Therapeutic activities;Therapeutic exercise;Patient/family education    PT Goals (Current goals can be found in the Care Plan section)  Acute Rehab PT Goals Patient Stated Goal: per family to go to rehaB PT Goal Formulation: With family Time For Goal Achievement: 03/03/16 Potential to Achieve Goals: Fair    Frequency Min 3X/week   Barriers to discharge Decreased caregiver support      Co-evaluation PT/OT/SLP Co-Evaluation/Treatment: Yes Reason for Co-Treatment: For patient/therapist safety;Necessary to address cognition/behavior during functional activity PT goals addressed during session: Mobility/safety with mobility OT goals addressed during session: ADL's and self-care       End of Session   Activity Tolerance: Patient limited by lethargy;Patient limited by pain Patient left: in chair;with call bell/phone within reach;with chair alarm set;with family/visitor present Nurse Communication: Mobility status    Functional Assessment Tool Used: climical judgement Functional Limitation: Mobility: Walking and moving around Mobility: Walking and Moving Around Current Status (W0981(G8978): 100 percent impaired, limited or restricted Mobility: Walking and Moving Around Goal Status (X9147(G8979): At least 1 percent but less than 20 percent impaired, limited or restricted    Time:  1041-1100 PT Time Calculation (min) (ACUTE ONLY): 19 min   Charges:   PT Evaluation $PT Eval Low Complexity: 1 Procedure     PT G Codes:   PT G-Codes **NOT FOR INPATIENT CLASS** Functional Assessment Tool Used: climical judgement Functional Limitation: Mobility: Walking and moving around Mobility: Walking and Moving Around Current Status (W2956(G8978): 100 percent impaired, limited or restricted Mobility: Walking and Moving Around Goal Status (O1308(G8979): At least 1 percent but less than 20 percent impaired, limited or restricted    Sharen HeckHill, Carmon Sahli Yolanda Shaw PT [phone]  02/18/2016, 12:48 PM

## 2016-02-18 NOTE — Progress Notes (Signed)
Long discussion with family, ABN issued, option 1 chosen. Family wishes to private pay for SNF. Notified CSW to initiate SNF search, family prefers [facility]. Attending notified of above.

## 2016-02-18 NOTE — Progress Notes (Signed)
[facility] PROGRESS NOTE  Alaney Witter WUJ:[id number] DOB: 1931/10/10 DOA: 02/17/2016 PCP: Georgann Housekeeper, MD  Interim summary and HPI 83 y.o. female with medical history significant of dementia, presented to the after sliding of the bed a fall about 4 hours ago. Patient report that she just slid of the bed and went to the floor. Patient report right shoulder, hip and back pain is worse with movement. She denies hitting her head or any LOC. She reported that she "stood up right away and called her son because of pain"  Daughter reported that patient's son her the fall and found the patient laying in the floor and called 911 immediately. They report no loss of consciousness. He also reported that this is the second fall in the past week.   X-ray shows pelvic fracture right pubis, and right single humerus fracture. Creatinine of 1.17 up from 0.13 December 2015. Orthopedics were consulted and recommended conservative treatment  Assessment/Plan: 1-Closed multiple fractures of right pubic rami and R side proximal humerus fracture  -seen by orthopedic service and recommending weight bearing as toelrated and analgesics -no surgery indicated -fractures non-displaced -oral pain meds controlling pain so far; but patient with difficulty mobilizing and very somnolent after pain meds -poor intake -limited care and assistance in this condition -SNF recommended   2-AKI -resolved with IVF's overnight -Cr back to baseline -patient encourage to increase/maintain good PO intake  3-elevated BP -most likely from pain -after receiving pain meds, BP gets control -monitor for now -continue pain management   4-dementia: -with some mild delirium overnight -at risk for agitation and further sundowning -explained this to family -will use PRN haldol (low dose) -avoid benzo's if possible   Code Status: Full Family Communication: Son and Daughter at bedside  Disposition Plan: SNF at discharge; family  willing to pay out of pocket for placement. SW and CM on board.   Consultants:  Dr. Magnus Ivan (orthopedic surgery)  Procedures:  See below for x-ray reports   Antibiotics:  None   HPI/Subjective: Complaining of pain in her right arm and right leg, no CP, no SOB. Vital signs stable and no nausea or vomiting. Per family reports, patient really uncomfortable and with poor intake.  Objective: Vitals:   02/18/16 0527 02/18/16 1440  BP: (!) 147/98 (!) 147/85  Pulse: (!) 115 96  Resp: 20 18  Temp: 97.6 F (36.4 C) 98.7 F (37.1 C)    Intake/Output Summary (Last 24 hours) at 02/18/16 1935 Last data filed at 02/18/16 1805  Gross per 24 hour  Intake               10 ml  Output              575 ml  Net             -565 ml   There were no vitals filed for this visit.  Exam:   General:  Confused and sleepy during examination. Per family at bedside with significant pain on her right side (pelvis and arm); poor intake and uncomfortable. No nausea, no vomiting, no CP and good O2 sat on RA. Normal VS appreciated.  Cardiovascular: S1 and S2, no rubs, no gallops  Respiratory: CTA bilaterally  Abdomen: soft, NT, ND, positive BS  Musculoskeletal: decrease range of motion on right arm and right leg due to pain; able to move extremities; no cyanosis or clubbing   Data Reviewed: Basic Metabolic Panel:  Recent Labs Lab 02/17/16 0510 02/18/16 0656  NA 140  139  K 4.2 3.9  CL 108 107  CO2 24 25  GLUCOSE 141* 106*  BUN 34* 23*  CREATININE 1.17* 0.89  CALCIUM 9.3 8.5*   CBC:  Recent Labs Lab 02/17/16 0510  WBC 9.6  NEUTROABS 8.5*  HGB 11.7*  HCT 34.8*  MCV 90.2  PLT 102*   Studies: Dg Shoulder Right  Result Date: 02/17/2016 CLINICAL DATA:  Fall this evening. History of prior falls and dementia. Right shoulder pain. EXAM: RIGHT SHOULDER - 2+ VIEW COMPARISON:  Chest radiograph 01/03/2013 FINDINGS: Prominent degenerative changes in the right shoulder involving the  glenohumeral and acromioclavicular joints. Loss of subacromial space suggests chronic rotator cuff arthropathy. The right humeral neck appears angulated, possibly due to old fracture deformity or acute impacted fracture. Soft tissues are unremarkable. IMPRESSION: Degenerative changes in the right shoulder with chronic rotator cuff arthropathy changes. Angulation at the humeral neck suggests old fracture deformity versus acute impacted fracture. Electronically Signed   By: Burman Nieves M.D.   On: 02/17/2016 04:23   Ct Head Wo Contrast  Result Date: 02/17/2016 CLINICAL DATA:  83 y/o F; status post fall with pain to the right-sided neck. EXAM: CT HEAD WITHOUT CONTRAST CT CERVICAL SPINE WITHOUT CONTRAST TECHNIQUE: Multidetector CT imaging of the head and cervical spine was performed following the standard protocol without intravenous contrast. Multiplanar CT image reconstructions of the cervical spine were also generated. COMPARISON:  10/08/2015 CT of the head. FINDINGS: CT HEAD FINDINGS Brain: No evidence of acute infarction, hemorrhage, hydrocephalus, extra-axial collection or mass lesion/mass effect. Left frontal lucency appears related to be streak artifact on reconstructions. Mild brain parenchymal volume loss and chronic microvascular ischemic changes for age. Vascular: Mild calcific atherosclerosis of cavernous internal carotid arteries. Skull: Calcified nodule of the scalp near the vertex present on prior CT of head, probably a dermal appendage cyst. No displaced calvarial fracture. Sinuses/Orbits: No acute finding. Bilateral intra-ocular lens replacement. Other: None. CT CERVICAL SPINE FINDINGS Alignment: Exaggerated cervical lordosis.  No significant listhesis. Skull base and vertebrae: No acute fracture. No primary bone lesion or focal pathologic process. Soft tissues and spinal canal: No prevertebral fluid or swelling. No visible canal hematoma. Disc levels: Cervical spondylosis with discogenic  changes greatest at the C5 through C7 level and multilevel facet arthrosis. No high-grade bony canal stenosis. Uncovertebral and facet hypertrophy results in multiple levels of foraminal narrowing greatest at the C5-6 level. Upper chest: Multiple scattered 2-3 mm nodules in the lung apices. Other: Left thyroid nodules measuring up to 30 mm. Calcific atherosclerosis of carotid bifurcations. IMPRESSION: 1. No acute intracranial abnormality identified. 2. No acute fracture or dislocation of the cervical spine. 3. Stable mild parenchymal volume loss and chronic microvascular ischemic changes of the brain. 4. Cervical spondylosis predominantly from the C5 through C7 levels. No high-grade bony canal stenosis. 5. Left thyroid nodules measuring up to 30 mm. Further evaluation with thyroid ultrasound is recommended on a nonemergent basis. 6. Several 2-3 mm pulmonary nodules in the lung apices. No follow-up needed if patient is low-risk (and has no known or suspected primary neoplasm). Non-contrast chest CT can be considered in 12 months if patient is high-risk. This recommendation follows the consensus statement: Guidelines for Management of Incidental Pulmonary Nodules Detected on CT Images: From the Fleischner Society 2017; Radiology 2017; 284:228-243. Electronically Signed   By: Mitzi Hansen M.D.   On: 02/17/2016 04:43   Ct Cervical Spine Wo Contrast  Result Date: 02/17/2016 CLINICAL DATA:  83 y/o F; status post  fall with pain to the right-sided neck. EXAM: CT HEAD WITHOUT CONTRAST CT CERVICAL SPINE WITHOUT CONTRAST TECHNIQUE: Multidetector CT imaging of the head and cervical spine was performed following the standard protocol without intravenous contrast. Multiplanar CT image reconstructions of the cervical spine were also generated. COMPARISON:  10/08/2015 CT of the head. FINDINGS: CT HEAD FINDINGS Brain: No evidence of acute infarction, hemorrhage, hydrocephalus, extra-axial collection or mass  lesion/mass effect. Left frontal lucency appears related to be streak artifact on reconstructions. Mild brain parenchymal volume loss and chronic microvascular ischemic changes for age. Vascular: Mild calcific atherosclerosis of cavernous internal carotid arteries. Skull: Calcified nodule of the scalp near the vertex present on prior CT of head, probably a dermal appendage cyst. No displaced calvarial fracture. Sinuses/Orbits: No acute finding. Bilateral intra-ocular lens replacement. Other: None. CT CERVICAL SPINE FINDINGS Alignment: Exaggerated cervical lordosis.  No significant listhesis. Skull base and vertebrae: No acute fracture. No primary bone lesion or focal pathologic process. Soft tissues and spinal canal: No prevertebral fluid or swelling. No visible canal hematoma. Disc levels: Cervical spondylosis with discogenic changes greatest at the C5 through C7 level and multilevel facet arthrosis. No high-grade bony canal stenosis. Uncovertebral and facet hypertrophy results in multiple levels of foraminal narrowing greatest at the C5-6 level. Upper chest: Multiple scattered 2-3 mm nodules in the lung apices. Other: Left thyroid nodules measuring up to 30 mm. Calcific atherosclerosis of carotid bifurcations. IMPRESSION: 1. No acute intracranial abnormality identified. 2. No acute fracture or dislocation of the cervical spine. 3. Stable mild parenchymal volume loss and chronic microvascular ischemic changes of the brain. 4. Cervical spondylosis predominantly from the C5 through C7 levels. No high-grade bony canal stenosis. 5. Left thyroid nodules measuring up to 30 mm. Further evaluation with thyroid ultrasound is recommended on a nonemergent basis. 6. Several 2-3 mm pulmonary nodules in the lung apices. No follow-up needed if patient is low-risk (and has no known or suspected primary neoplasm). Non-contrast chest CT can be considered in 12 months if patient is high-risk. This recommendation follows the consensus  statement: Guidelines for Management of Incidental Pulmonary Nodules Detected on CT Images: From the Fleischner Society 2017; Radiology 2017; 284:228-243. Electronically Signed   By: Mitzi Hansen M.D.   On: 02/17/2016 04:43   Dg Knee Complete 4 Views Right  Result Date: 02/17/2016 CLINICAL DATA:  Pain after a fall this evening. EXAM: RIGHT KNEE - COMPLETE 4+ VIEW COMPARISON:  None. FINDINGS: Degenerative changes in the right knee. Slight contour irregularities on the femoral condyles likely representing osteochondral defects. No evidence of acute fracture or dislocation. No significant effusion. Soft tissues are unremarkable. IMPRESSION: No acute bony abnormalities. Degenerative changes in the knee with probable osteochondral defects. Electronically Signed   By: Burman Nieves M.D.   On: 02/17/2016 04:26   Dg Hip Unilat W Or Wo Pelvis 2-3 Views Right  Result Date: 02/17/2016 CLINICAL DATA:  Pain after a fall. EXAM: DG HIP (WITH OR WITHOUT PELVIS) 2-3V RIGHT COMPARISON:  06/29/2014 FINDINGS: Displaced fracture of the right superior and inferior pubic rami, new since previous study, and probably acute. Old fracture deformities of the left pubic rami. Degenerative changes in the hips. SI joints and symphysis pubis are not displaced. No evidence of fracture or dislocation of the right hip. IMPRESSION: Acute appearing displaced fractures involving the superior and inferior right pubic rami. Old fracture deformities of the left pubic rami. Degenerative changes in the hips. Electronically Signed   By: Marisa Cyphers.D.  On: 02/17/2016 04:25    Scheduled Meds: . enoxaparin (LOVENOX) injection  30 mg Subcutaneous Q24H  . senna  1 tablet Oral BID   Continuous Infusions:  Active Problems:   Closed stable fracture of multiple pubic rami (HCC)   Closed fracture of right proximal humerus   Elevated BP without diagnosis of hypertension   AKI (acute kidney injury) (HCC)   Dementia with  behavioral disturbance    Time spent: 25 minutes    Vassie LollMadera, Laurieanne Galloway  [facility] Pager [phone]. If 7PM-7AM, please contact night-coverage at [email or web address], password [email or web address] 02/18/2016, 7:35 PM  LOS: 0 days

## 2016-02-18 NOTE — Consult Note (Signed)
Reason for Consult:  Right-sided pubic rami fracture; right proximal humerus fracture Referring Physician: Joseph Berkshire, MD/EDP  Yolanda Shaw is an 83 y.o. female.  HPI:   83 yo female who lives at home with family sustained a mechanical fall injuring her pelvis and right shoulder.  Was brought to the [facility] ED and found to have a right proximal humerus fracture and right superior/inferior pubic rami fractures.  According to family, she has been showing signs of dementia.  Her pain tolerance of these fractures has been poor since her admission.  She had similar fractures about 2 years ago requiring short-term skilled nursing placement.  Past Medical History:  Diagnosis Date  . Dementia     Past Surgical History:  Procedure Laterality Date  . ADENOIDECTOMY    . SHOULDER SURGERY     right  . TONSILLECTOMY    . WRIST SURGERY      History reviewed. No pertinent family history.  Social History:  reports that she quit smoking about 69 years ago. She has never used smokeless tobacco. She reports that she drinks alcohol. She reports that she does not use drugs.  Allergies:  Allergies  Allergen Reactions  . Ether Other (See Comments)    Hard to wake up after procedure  . Amitriptyline Rash  . Penicillins Rash    Has patient had a PCN reaction causing immediate rash, facial/tongue/throat swelling, SOB or lightheadedness with hypotension: No Has patient had a PCN reaction causing severe rash involving mucus membranes or skin necrosis: No Has patient had a PCN reaction that required hospitalization No Has patient had a PCN reaction occurring within the last 10 years: No If all of the above answers are "NO", then may proceed with Cephalosporin use.    Medications: I have reviewed the patient's current medications.  Results for orders placed or performed during the hospital encounter of 02/17/16 (from the past 48 hour(s))  CBC with Differential/Platelet     Status: Abnormal    Collection Time: 02/17/16  5:10 AM  Result Value Ref Range   WBC 9.6 4.0 - 10.5 K/uL   RBC 3.86 (L) 3.87 - 5.11 MIL/uL   Hemoglobin 11.7 (L) 12.0 - 15.0 g/dL   HCT 34.8 (L) 36.0 - 46.0 %   MCV 90.2 78.0 - 100.0 fL   MCH 30.3 26.0 - 34.0 pg   MCHC 33.6 30.0 - 36.0 g/dL   RDW 14.5 11.5 - 15.5 %   Platelets 102 (L) 150 - 400 K/uL    Comment: SPECIMEN CHECKED FOR CLOTS REPEATED TO VERIFY PLATELET COUNT CONFIRMED BY SMEAR    Neutrophils Relative % 88 %   Neutro Abs 8.5 (H) 1.7 - 7.7 K/uL   Lymphocytes Relative 4 %   Lymphs Abs 0.4 (L) 0.7 - 4.0 K/uL   Monocytes Relative 8 %   Monocytes Absolute 0.7 0.1 - 1.0 K/uL   Eosinophils Relative 0 %   Eosinophils Absolute 0.0 0.0 - 0.7 K/uL   Basophils Relative 0 %   Basophils Absolute 0.0 0.0 - 0.1 K/uL  Basic metabolic panel     Status: Abnormal   Collection Time: 02/17/16  5:10 AM  Result Value Ref Range   Sodium 140 135 - 145 mmol/L   Potassium 4.2 3.5 - 5.1 mmol/L   Chloride 108 101 - 111 mmol/L   CO2 24 22 - 32 mmol/L   Glucose, Bld 141 (H) 65 - 99 mg/dL   BUN 34 (H) 6 - 20 mg/dL  Creatinine, Ser 1.17 (H) 0.44 - 1.00 mg/dL   Calcium 9.3 8.9 - 10.3 mg/dL   GFR calc non Af Amer 42 (L) >60 mL/min   GFR calc Af Amer 49 (L) >60 mL/min    Comment: (NOTE) The eGFR has been calculated using the CKD EPI equation. This calculation has not been validated in all clinical situations. eGFR's persistently <60 mL/min signify possible Chronic Kidney Disease.    Anion gap 8 5 - 15    Dg Shoulder Right  Result Date: 02/17/2016 CLINICAL DATA:  Fall this evening. History of prior falls and dementia. Right shoulder pain. EXAM: RIGHT SHOULDER - 2+ VIEW COMPARISON:  Chest radiograph 01/03/2013 FINDINGS: Prominent degenerative changes in the right shoulder involving the glenohumeral and acromioclavicular joints. Loss of subacromial space suggests chronic rotator cuff arthropathy. The right humeral neck appears angulated, possibly due to old fracture  deformity or acute impacted fracture. Soft tissues are unremarkable. IMPRESSION: Degenerative changes in the right shoulder with chronic rotator cuff arthropathy changes. Angulation at the humeral neck suggests old fracture deformity versus acute impacted fracture. Electronically Signed   By: Lucienne Capers M.D.   On: 02/17/2016 04:23   Ct Head Wo Contrast  Result Date: 02/17/2016 CLINICAL DATA:  83 y/o F; status post fall with pain to the right-sided neck. EXAM: CT HEAD WITHOUT CONTRAST CT CERVICAL SPINE WITHOUT CONTRAST TECHNIQUE: Multidetector CT imaging of the head and cervical spine was performed following the standard protocol without intravenous contrast. Multiplanar CT image reconstructions of the cervical spine were also generated. COMPARISON:  10/08/2015 CT of the head. FINDINGS: CT HEAD FINDINGS Brain: No evidence of acute infarction, hemorrhage, hydrocephalus, extra-axial collection or mass lesion/mass effect. Left frontal lucency appears related to be streak artifact on reconstructions. Mild brain parenchymal volume loss and chronic microvascular ischemic changes for age. Vascular: Mild calcific atherosclerosis of cavernous internal carotid arteries. Skull: Calcified nodule of the scalp near the vertex present on prior CT of head, probably a dermal appendage cyst. No displaced calvarial fracture. Sinuses/Orbits: No acute finding. Bilateral intra-ocular lens replacement. Other: None. CT CERVICAL SPINE FINDINGS Alignment: Exaggerated cervical lordosis.  No significant listhesis. Skull base and vertebrae: No acute fracture. No primary bone lesion or focal pathologic process. Soft tissues and spinal canal: No prevertebral fluid or swelling. No visible canal hematoma. Disc levels: Cervical spondylosis with discogenic changes greatest at the C5 through C7 level and multilevel facet arthrosis. No high-grade bony canal stenosis. Uncovertebral and facet hypertrophy results in multiple levels of foraminal  narrowing greatest at the C5-6 level. Upper chest: Multiple scattered 2-3 mm nodules in the lung apices. Other: Left thyroid nodules measuring up to 30 mm. Calcific atherosclerosis of carotid bifurcations. IMPRESSION: 1. No acute intracranial abnormality identified. 2. No acute fracture or dislocation of the cervical spine. 3. Stable mild parenchymal volume loss and chronic microvascular ischemic changes of the brain. 4. Cervical spondylosis predominantly from the C5 through C7 levels. No high-grade bony canal stenosis. 5. Left thyroid nodules measuring up to 30 mm. Further evaluation with thyroid ultrasound is recommended on a nonemergent basis. 6. Several 2-3 mm pulmonary nodules in the lung apices. No follow-up needed if patient is low-risk (and has no known or suspected primary neoplasm). Non-contrast chest CT can be considered in 12 months if patient is high-risk. This recommendation follows the consensus statement: Guidelines for Management of Incidental Pulmonary Nodules Detected on CT Images: From the Fleischner Society 2017; Radiology 2017; 284:228-243. Electronically Signed   By: Edgardo Roys.D.  On: 02/17/2016 04:43   Ct Cervical Spine Wo Contrast  Result Date: 02/17/2016 CLINICAL DATA:  83 y/o F; status post fall with pain to the right-sided neck. EXAM: CT HEAD WITHOUT CONTRAST CT CERVICAL SPINE WITHOUT CONTRAST TECHNIQUE: Multidetector CT imaging of the head and cervical spine was performed following the standard protocol without intravenous contrast. Multiplanar CT image reconstructions of the cervical spine were also generated. COMPARISON:  10/08/2015 CT of the head. FINDINGS: CT HEAD FINDINGS Brain: No evidence of acute infarction, hemorrhage, hydrocephalus, extra-axial collection or mass lesion/mass effect. Left frontal lucency appears related to be streak artifact on reconstructions. Mild brain parenchymal volume loss and chronic microvascular ischemic changes for age. Vascular:  Mild calcific atherosclerosis of cavernous internal carotid arteries. Skull: Calcified nodule of the scalp near the vertex present on prior CT of head, probably a dermal appendage cyst. No displaced calvarial fracture. Sinuses/Orbits: No acute finding. Bilateral intra-ocular lens replacement. Other: None. CT CERVICAL SPINE FINDINGS Alignment: Exaggerated cervical lordosis.  No significant listhesis. Skull base and vertebrae: No acute fracture. No primary bone lesion or focal pathologic process. Soft tissues and spinal canal: No prevertebral fluid or swelling. No visible canal hematoma. Disc levels: Cervical spondylosis with discogenic changes greatest at the C5 through C7 level and multilevel facet arthrosis. No high-grade bony canal stenosis. Uncovertebral and facet hypertrophy results in multiple levels of foraminal narrowing greatest at the C5-6 level. Upper chest: Multiple scattered 2-3 mm nodules in the lung apices. Other: Left thyroid nodules measuring up to 30 mm. Calcific atherosclerosis of carotid bifurcations. IMPRESSION: 1. No acute intracranial abnormality identified. 2. No acute fracture or dislocation of the cervical spine. 3. Stable mild parenchymal volume loss and chronic microvascular ischemic changes of the brain. 4. Cervical spondylosis predominantly from the C5 through C7 levels. No high-grade bony canal stenosis. 5. Left thyroid nodules measuring up to 30 mm. Further evaluation with thyroid ultrasound is recommended on a nonemergent basis. 6. Several 2-3 mm pulmonary nodules in the lung apices. No follow-up needed if patient is low-risk (and has no known or suspected primary neoplasm). Non-contrast chest CT can be considered in 12 months if patient is high-risk. This recommendation follows the consensus statement: Guidelines for Management of Incidental Pulmonary Nodules Detected on CT Images: From the Fleischner Society 2017; Radiology 2017; 284:228-243. Electronically Signed   By: Kristine Garbe M.D.   On: 02/17/2016 04:43   Dg Knee Complete 4 Views Right  Result Date: 02/17/2016 CLINICAL DATA:  Pain after a fall this evening. EXAM: RIGHT KNEE - COMPLETE 4+ VIEW COMPARISON:  None. FINDINGS: Degenerative changes in the right knee. Slight contour irregularities on the femoral condyles likely representing osteochondral defects. No evidence of acute fracture or dislocation. No significant effusion. Soft tissues are unremarkable. IMPRESSION: No acute bony abnormalities. Degenerative changes in the knee with probable osteochondral defects. Electronically Signed   By: Lucienne Capers M.D.   On: 02/17/2016 04:26   Dg Hip Unilat W Or Wo Pelvis 2-3 Views Right  Result Date: 02/17/2016 CLINICAL DATA:  Pain after a fall. EXAM: DG HIP (WITH OR WITHOUT PELVIS) 2-3V RIGHT COMPARISON:  06/29/2014 FINDINGS: Displaced fracture of the right superior and inferior pubic rami, new since previous study, and probably acute. Old fracture deformities of the left pubic rami. Degenerative changes in the hips. SI joints and symphysis pubis are not displaced. No evidence of fracture or dislocation of the right hip. IMPRESSION: Acute appearing displaced fractures involving the superior and inferior right pubic rami. Old fracture  deformities of the left pubic rami. Degenerative changes in the hips. Electronically Signed   By: Lucienne Capers M.D.   On: 02/17/2016 04:25   I have independently reviewed her x-rays and see the right-sided pubic rami fractures and her right proximal humerus fracture   ROS Blood pressure (!) 147/98, pulse (!) 115, temperature 97.6 F (36.4 C), temperature source Axillary, resp. rate 20, SpO2 96 %. Physical Exam  Constitutional: She appears well-developed.  HENT:  Head: Normocephalic and atraumatic.  Neck: Normal range of motion. Neck supple.  Respiratory: Effort normal.  GI: Soft.  Musculoskeletal:       Right shoulder: She exhibits decreased range of motion,  tenderness and bony tenderness.       Right hip: She exhibits decreased range of motion and bony tenderness.  Neurological: She is alert.    Assessment/Plan: Bother her pubic rami fractures and right proximal humerus fractures can be treated conservatively with pain control and early mobility.  Will order a Social Work consult for skilled nursing placement.  She does need to be listed as an inpatient admission.  PT/OT for mobility with WBAT for both areas.  No right shoulder abduction for now.  Mcarthur Rossetti 02/18/2016, 7:13 AM

## 2016-02-18 NOTE — Progress Notes (Signed)
Occupational Therapy Treatment Patient Details Name: Yolanda Shaw MRN: [id number] DOB: 1931/12/27 Today's Date: 02/18/2016    History of present illness Pt fell and has right-sided pubic rami fractures and a right proximal humerus fracture.  Per MD both be treated conservatively with mobility and weight-bearing as tolerated- and pt can use a walker with RUE   OT comments  Pt very sleepy during OT eval.  Needed 2 person A for all activity   Follow Up Recommendations  SNF    Equipment Recommendations  None recommended by OT       Precautions / Restrictions Precautions Precautions: Fall Precaution Comments: pt is WBAT RUE and RLE       Mobility Bed Mobility Overal bed mobility: Needs Assistance Bed Mobility: Supine to Sit     Supine to sit: +2 for safety/equipment;+2 for physical assistance;Max assist        Transfers Overall transfer level: Needs assistance Equipment used: Rolling walker (2 wheeled) Transfers: Sit to/from [employer] Sit to Stand: Max assist;+2 physical assistance;+2 safety/equipment Stand pivot transfers: Max assist;+2 physical assistance;+2 safety/equipment                ADL Overall ADL's : Needs assistance/impaired     Grooming: Bed level;Maximal assistance Grooming Details (indicate cue type and reason): OT handed pt washcloth and pt did not bring to face.  Pt very sleepy due to meds per RN                 Toilet Transfer: Maximal assistance;+2 for physical assistance;Cueing for sequencing;Cueing for safety;RW;BSC;+2 for safety/equipment   Toileting- Clothing Manipulation and Hygiene: +2 for safety/equipment;+2 for physical assistance;Cueing for sequencing;Cueing for safety;Sit to/from stand         General ADL Comments: Pt will need significant A with ADL activity at home                Cognition     Overall Cognitive Status: History of cognitive impairments - at baseline                        Extremity/Trunk Assessment                     General Comments      Pertinent Vitals/ Pain       Pain Location: pt complained of pain sitting in chair but did not rate Pain Intervention(s): Limited activity within patient's tolerance;Monitored during session;Patient requesting pain meds-RN notified;RN gave pain meds during session;Repositioned         Frequency  Min 2X/week        Progress Toward Goals  OT Goals(current goals can now be found in the care plan section)  Progress towards OT goals: OT to reassess next treatment     Plan Discharge plan remains appropriate    Co-evaluation                 End of Session Equipment Utilized During Treatment: Rolling walker   Activity Tolerance Patient limited by fatigue   Patient Left in chair;with call bell/phone within reach;with family/visitor present   Nurse Communication Mobility status    Functional Assessment Tool Used: clinical observation Functional Limitation: Self care Self Care Current Status (M0102): At least 80 percent but less than 100 percent impaired, limited or restricted Self Care Goal Status (V2536): At least 1 percent but less than 20 percent impaired, limited or restricted   Time: 1029-1109 OT Time Calculation (min):  40 min  Charges: OT G-codes **NOT FOR INPATIENT CLASS** Functional Assessment Tool Used: clinical observation Functional Limitation: Self care Self Care Current Status (F6213(G8987): At least 80 percent but less than 100 percent impaired, limited or restricted Self Care Goal Status (Y8657(G8988): At least 1 percent but less than 20 percent impaired, limited or restricted OT General Charges $OT Visit: 1 Procedure OT Treatments $Self Care/Home Management : 23-37 mins  Leeland Lovelady, Metro KungLorraine D 02/18/2016, 11:21 AM

## 2016-02-18 NOTE — Care Management Obs Status (Signed)
MEDICARE OBSERVATION STATUS NOTIFICATION   Patient Details  Name: Yolanda Shaw MRN: [id number] Date of Birth: 07/17/1931   Medicare Observation Status Notification Given:  Yes    Alexis Goodelleele, Mahrosh Donnell K, RN 02/18/2016, 10:48 AM

## 2016-02-18 NOTE — Clinical Social Work Note (Signed)
Per RNCM, family willing to pay privately at [facility]. Referral faxed via HUB and facility notified.   Anticipated discharge: Wed, 2/14.   Full psychosocial to follow. MSW remains available as needed.   Derenda FennelBashira Cherylanne Ardelean, MSW [phone] 02/18/2016 3:38 PM

## 2016-02-18 NOTE — NC FL2 (Signed)
[place] MEDICAID FL2 LEVEL OF CARE SCREENING TOOL     IDENTIFICATION  Patient Name: Yolanda Shaw Birthdate: 02-05-1931 Sex: female Admission Date (Current Location): 02/17/2016  [facility] and [place] Number:  Producer, television/film/video and Address:  [facility],  [address] [place] [address]      Provider Number: [id number]  Attending Physician Name and Address:  Vassie Loll, MD  Relative Name and Phone Number:       Current Level of Care: Hospital Recommended Level of Care: Skilled Nursing Facility Prior Approval Number:    Date Approved/Denied:   PASRR Number:  [id number] Shaw   Discharge Plan: SNF    Current Diagnoses: Patient Active Problem List   Diagnosis Date Noted  . Closed stable fracture of multiple pubic rami (HCC) 02/17/2016  . Closed fracture of right proximal humerus   . Elevated BP without diagnosis of hypertension   . AKI (acute kidney injury) (HCC)   . Dementia with behavioral disturbance   . Cramping affecting pregnancy, antepartum 11/20/2014  . Pelvic fracture (HCC) 06/30/2014  . Thrombocytopenia (HCC) 06/30/2014  . Tremor 06/30/2014  . Pressure ulcer 06/30/2014  . Fracture of left pubis (HCC)   . Ischium fracture (HCC)   . Left hip pain   . Bilateral pubic rami fractures (HCC) 06/29/2014    Orientation RESPIRATION BLADDER Height & Weight     Self  O2, Normal (at 2L) Continent Weight:   Height:     BEHAVIORAL SYMPTOMS/MOOD NEUROLOGICAL BOWEL NUTRITION STATUS   (none )  (none ) Continent Diet (Regular )  AMBULATORY STATUS COMMUNICATION OF NEEDS Skin   Extensive Assist Verbally Normal                       Personal Care Assistance Level of Assistance  Dressing, Feeding, Bathing Bathing Assistance: Limited assistance Feeding assistance: Independent Dressing Assistance: Limited assistance     Functional Limitations Info  Speech, Hearing, Sight Sight Info: Adequate Hearing Info: Adequate Speech Info: Adequate     SPECIAL CARE FACTORS FREQUENCY  PT (By licensed PT)     PT Frequency: 3              Contractures      Additional Factors Info  Code Status, Allergies Code Status Info: FULL CODE  Allergies Info: Ether, Amitriptyline, Penicillins           Current Medications (02/18/2016):  This is the current hospital active medication list Current Facility-Administered Medications  Medication Dose Route Frequency Provider Last Rate Last Dose  . acetaminophen (TYLENOL) tablet 650 mg  650 mg Oral Q6H PRN Lenox Ponds, MD       Or  . acetaminophen (TYLENOL) suppository 650 mg  650 mg Rectal Q6H PRN Lenox Ponds, MD      . enoxaparin (LOVENOX) injection 30 mg  30 mg Subcutaneous Q24H Lenox Ponds, MD   30 mg at 02/18/16 1035  . hydrALAZINE (APRESOLINE) injection 2 mg  2 mg Intravenous Q8H PRN Lenox Ponds, MD      . ondansetron [facility]) tablet 4 mg  4 mg Oral Q6H PRN Lenox Ponds, MD       Or  . ondansetron [facility]) injection 4 mg  4 mg Intravenous Q6H PRN Lenox Ponds, MD      . oxyCODONE (Oxy IR/ROXICODONE) immediate release tablet 5 mg  5 mg Oral Q4H PRN Lenox Ponds, MD   5 mg at 02/18/16 1036  .  senna (SENOKOT) tablet 8.6 mg  1 tablet Oral BID Lenox PondsEdwin Silva Zapata, MD   8.6 mg at 02/18/16 1036     Discharge Medications: Please see discharge summary for Shaw list of discharge medications.  Relevant Imaging Results:  Relevant Lab Results:   Additional Information SSN [id number]  Vaughan Brownerixon, Yolanda Shaw

## 2016-02-18 NOTE — Clinical Social Work Note (Signed)
Patient is under OBS and does not qualify for SNF placement under Medicare guidelines.   Patient will need to pay out-of-pocket if SNF placement is desired as Medicare will not cover cost.   Yolanda Shaw, MSW [phone] 02/18/2016 1:45 PM

## 2016-02-19 ENCOUNTER — Encounter (HOSPITAL_COMMUNITY): Payer: Self-pay | Admitting: Emergency Medicine

## 2016-02-19 ENCOUNTER — Emergency Department (HOSPITAL_COMMUNITY)
Admission: EM | Admit: 2016-02-19 | Discharge: 2016-02-20 | Disposition: A | Discharge status: Home / Self Care | Payer: Medicare Other | Attending: Emergency Medicine | Admitting: Emergency Medicine

## 2016-02-19 DIAGNOSIS — S32591A Other specified fracture of right pubis, initial encounter for closed fracture: Secondary | ICD-10-CM | POA: Diagnosis not present

## 2016-02-19 DIAGNOSIS — R000 Tachycardia, unspecified: Secondary | ICD-10-CM | POA: Diagnosis not present

## 2016-02-19 DIAGNOSIS — S42294D Other nondisplaced fracture of upper end of right humerus, subsequent encounter for fracture with routine healing: Secondary | ICD-10-CM | POA: Diagnosis not present

## 2016-02-19 DIAGNOSIS — R030 Elevated blood-pressure reading, without diagnosis of hypertension: Secondary | ICD-10-CM

## 2016-02-19 DIAGNOSIS — S329XXA Fracture of unspecified parts of lumbosacral spine and pelvis, initial encounter for closed fracture: Secondary | ICD-10-CM | POA: Diagnosis not present

## 2016-02-19 DIAGNOSIS — S42309A Unspecified fracture of shaft of humerus, unspecified arm, initial encounter for closed fracture: Secondary | ICD-10-CM | POA: Diagnosis not present

## 2016-02-19 DIAGNOSIS — Z79899 Other long term (current) drug therapy: Secondary | ICD-10-CM | POA: Insufficient documentation

## 2016-02-19 DIAGNOSIS — I10 Essential (primary) hypertension: Secondary | ICD-10-CM | POA: Insufficient documentation

## 2016-02-19 DIAGNOSIS — F0391 Unspecified dementia with behavioral disturbance: Secondary | ICD-10-CM | POA: Diagnosis not present

## 2016-02-19 DIAGNOSIS — N179 Acute kidney failure, unspecified: Secondary | ICD-10-CM

## 2016-02-19 DIAGNOSIS — Z87891 Personal history of nicotine dependence: Secondary | ICD-10-CM | POA: Diagnosis not present

## 2016-02-19 LAB — BASIC METABOLIC PANEL
ANION GAP: 5 (ref 5–15)
BUN: 23 mg/dL — ABNORMAL HIGH (ref 6–20)
CO2: 27 mmol/L (ref 22–32)
Calcium: 9.1 mg/dL (ref 8.9–10.3)
Chloride: 108 mmol/L (ref 101–111)
Creatinine, Ser: 0.74 mg/dL (ref 0.44–1.00)
GLUCOSE: 107 mg/dL — AB (ref 65–99)
POTASSIUM: 4 mmol/L (ref 3.5–5.1)
SODIUM: 140 mmol/L (ref 135–145)

## 2016-02-19 MED ORDER — OXYCODONE HCL 5 MG PO TABS: 5.0000 mg | ORAL_TABLET | ORAL | 0 refills | Status: DC | PRN

## 2016-02-19 MED ORDER — ACETAMINOPHEN 325 MG PO TABS: 650.0000 mg | ORAL_TABLET | Freq: Three times a day (TID) | ORAL | Status: DC

## 2016-02-19 MED ORDER — ENOXAPARIN SODIUM 40 MG/0.4ML ~~LOC~~ SOLN
40.0000 mg | Freq: Every day | SUBCUTANEOUS | Status: DC
  Administered 2016-02-19: 40 mg via SUBCUTANEOUS
  Filled 2016-02-19: qty 0.4

## 2016-02-19 MED ORDER — SENNA 8.6 MG PO TABS: 1.0000 | ORAL_TABLET | Freq: Every day | ORAL | 0 refills | Status: DC

## 2016-02-19 NOTE — Discharge Summary (Signed)
Physician Discharge Summary  Yolanda Shaw XBM:[id number] DOB: 07/25/1931 DOA: 02/17/2016  PCP: Georgann Housekeeper, MD  Admit date: 02/17/2016 Discharge date: 02/19/2016  Admitted From: Home  Disposition:  SNF  Discharge Condition:  STABLE  CODE STATUS: FULL  Brief/Interim Summary: HPI:   83 yo female who lives at home with family sustained a mechanical fall injuring her pelvis and right shoulder.  Was brought to the [facility] ED and found to have a right proximal humerus fracture and right superior/inferior pubic rami fractures.  According to family, she has been showing signs of dementia.  Her pain tolerance of these fractures has been poor since her admission.  She had similar fractures about 2 years ago requiring short-term skilled nursing placement.  X-ray shows pelvic fracture right pubis, and right single humerus fracture. Creatinine of 1.17 up from 0.13 December 2015. Orthopedics were consulted and recommended conservative treatment  1-Closed multiple fractures of rightpubic rami and R side proximal humerus fracture  -seen by orthopedic service and recommending weight bearing as toelrated and analgesics -no surgery indicated -fractures non-displaced -oral pain meds controlling pain so far;  -poor intake slightly improved with encouragement from son -SNF recommended and patient going to [facility].  2-AKI -resolved with IVF's overnight -Cr back to baseline -patient encourage to increase/maintain good PO intake  3-elevated BP -most likely from pain -after receiving pain meds, BP gets controlled -monitor for now -continue pain management as needed  4-dementia: -with some mild delirium overnight -at risk for agitation and further sundowning -explained this to family -avoid benzo's if possible   Code Status: Full Family Communication: Son and Daughter at bedside  Disposition Plan: SNF at discharge   Consultants:  Dr. Magnus Ivan (orthopedic  surgery)  Procedures:  See below for x-ray reports   Antibiotics:  None   Discharge Diagnoses:  Active Problems:   Closed stable fracture of multiple pubic rami (HCC)   Closed fracture of right proximal humerus   Elevated BP without diagnosis of hypertension   AKI (acute kidney injury) (HCC)   Dementia with behavioral disturbance  Discharge Instructions  Discharge Instructions    Diet - low sodium heart healthy    Complete by:  As directed    Increase activity slowly    Complete by:  As directed      Allergies as of 02/19/2016      Reactions   Ether Other (See Comments)   Hard to wake up after procedure   Amitriptyline Rash   Penicillins Rash   Has patient had a PCN reaction causing immediate rash, facial/tongue/throat swelling, SOB or lightheadedness with hypotension: No Has patient had a PCN reaction causing severe rash involving mucus membranes or skin necrosis: No Has patient had a PCN reaction that required hospitalization No Has patient had a PCN reaction occurring within the last 10 years: No If all of the above answers are "NO", then may proceed with Cephalosporin use.      Medication List    STOP taking these medications   aspirin 325 MG EC tablet     TAKE these medications   acetaminophen 325 MG tablet Commonly known as:  TYLENOL Take 2 tablets (650 mg total) by mouth every 8 (eight) hours.   oxyCODONE 5 MG immediate release tablet Commonly known as:  Oxy IR/ROXICODONE Take 1 tablet (5 mg total) by mouth every 4 (four) hours as needed for severe pain.   senna 8.6 MG Tabs tablet Commonly known as:  SENOKOT Take 1 tablet (8.6 mg total)  by mouth daily.       Contact information for follow-up providers    Kathryne Hitch, MD. Schedule an appointment as soon as possible for a visit in 2 week(s).   Specialty:  Orthopedic Surgery Contact information: [address] [place] [address] [phone]            Contact  information for after-discharge care    Destination    [facility] Follow up.   Specialty:  Skilled Nursing Facility Contact information: [address] [place] [address] [phone]                 Allergies  Allergen Reactions  . Ether Other (See Comments)    Hard to wake up after procedure  . Amitriptyline Rash  . Penicillins Rash    Has patient had a PCN reaction causing immediate rash, facial/tongue/throat swelling, SOB or lightheadedness with hypotension: No Has patient had a PCN reaction causing severe rash involving mucus membranes or skin necrosis: No Has patient had a PCN reaction that required hospitalization No Has patient had a PCN reaction occurring within the last 10 years: No If all of the above answers are "NO", then may proceed with Cephalosporin use.    Procedures/Studies: Dg Shoulder Right  Result Date: 02/17/2016 CLINICAL DATA:  Fall this evening. History of prior falls and dementia. Right shoulder pain. EXAM: RIGHT SHOULDER - 2+ VIEW COMPARISON:  Chest radiograph 01/03/2013 FINDINGS: Prominent degenerative changes in the right shoulder involving the glenohumeral and acromioclavicular joints. Loss of subacromial space suggests chronic rotator cuff arthropathy. The right humeral neck appears angulated, possibly due to old fracture deformity or acute impacted fracture. Soft tissues are unremarkable. IMPRESSION: Degenerative changes in the right shoulder with chronic rotator cuff arthropathy changes. Angulation at the humeral neck suggests old fracture deformity versus acute impacted fracture. Electronically Signed   By: Burman Nieves M.D.   On: 02/17/2016 04:23   Ct Head Wo Contrast  Result Date: 02/17/2016 CLINICAL DATA:  83 y/o F; status post fall with pain to the right-sided neck. EXAM: CT HEAD WITHOUT CONTRAST CT CERVICAL SPINE WITHOUT CONTRAST TECHNIQUE: Multidetector CT imaging of the head and cervical spine was  performed following the standard protocol without intravenous contrast. Multiplanar CT image reconstructions of the cervical spine were also generated. COMPARISON:  10/08/2015 CT of the head. FINDINGS: CT HEAD FINDINGS Brain: No evidence of acute infarction, hemorrhage, hydrocephalus, extra-axial collection or mass lesion/mass effect. Left frontal lucency appears related to be streak artifact on reconstructions. Mild brain parenchymal volume loss and chronic microvascular ischemic changes for age. Vascular: Mild calcific atherosclerosis of cavernous internal carotid arteries. Skull: Calcified nodule of the scalp near the vertex present on prior CT of head, probably a dermal appendage cyst. No displaced calvarial fracture. Sinuses/Orbits: No acute finding. Bilateral intra-ocular lens replacement. Other: None. CT CERVICAL SPINE FINDINGS Alignment: Exaggerated cervical lordosis.  No significant listhesis. Skull base and vertebrae: No acute fracture. No primary bone lesion or focal pathologic process. Soft tissues and spinal canal: No prevertebral fluid or swelling. No visible canal hematoma. Disc levels: Cervical spondylosis with discogenic changes greatest at the C5 through C7 level and multilevel facet arthrosis. No high-grade bony canal stenosis. Uncovertebral and facet hypertrophy results in multiple levels of foraminal narrowing greatest at the C5-6 level. Upper chest: Multiple scattered 2-3 mm nodules in the lung apices. Other: Left thyroid nodules measuring up to 30 mm. Calcific atherosclerosis of carotid bifurcations. IMPRESSION: 1. No acute intracranial  abnormality identified. 2. No acute fracture or dislocation of the cervical spine. 3. Stable mild parenchymal volume loss and chronic microvascular ischemic changes of the brain. 4. Cervical spondylosis predominantly from the C5 through C7 levels. No high-grade bony canal stenosis. 5. Left thyroid nodules measuring up to 30 mm. Further evaluation with thyroid  ultrasound is recommended on a nonemergent basis. 6. Several 2-3 mm pulmonary nodules in the lung apices. No follow-up needed if patient is low-risk (and has no known or suspected primary neoplasm). Non-contrast chest CT can be considered in 12 months if patient is high-risk. This recommendation follows the consensus statement: Guidelines for Management of Incidental Pulmonary Nodules Detected on CT Images: From the Fleischner Society 2017; Radiology 2017; 284:228-243. Electronically Signed   By: Mitzi Hansen M.D.   On: 02/17/2016 04:43   Ct Cervical Spine Wo Contrast  Result Date: 02/17/2016 CLINICAL DATA:  83 y/o F; status post fall with pain to the right-sided neck. EXAM: CT HEAD WITHOUT CONTRAST CT CERVICAL SPINE WITHOUT CONTRAST TECHNIQUE: Multidetector CT imaging of the head and cervical spine was performed following the standard protocol without intravenous contrast. Multiplanar CT image reconstructions of the cervical spine were also generated. COMPARISON:  10/08/2015 CT of the head. FINDINGS: CT HEAD FINDINGS Brain: No evidence of acute infarction, hemorrhage, hydrocephalus, extra-axial collection or mass lesion/mass effect. Left frontal lucency appears related to be streak artifact on reconstructions. Mild brain parenchymal volume loss and chronic microvascular ischemic changes for age. Vascular: Mild calcific atherosclerosis of cavernous internal carotid arteries. Skull: Calcified nodule of the scalp near the vertex present on prior CT of head, probably a dermal appendage cyst. No displaced calvarial fracture. Sinuses/Orbits: No acute finding. Bilateral intra-ocular lens replacement. Other: None. CT CERVICAL SPINE FINDINGS Alignment: Exaggerated cervical lordosis.  No significant listhesis. Skull base and vertebrae: No acute fracture. No primary bone lesion or focal pathologic process. Soft tissues and spinal canal: No prevertebral fluid or swelling. No visible canal hematoma. Disc levels:  Cervical spondylosis with discogenic changes greatest at the C5 through C7 level and multilevel facet arthrosis. No high-grade bony canal stenosis. Uncovertebral and facet hypertrophy results in multiple levels of foraminal narrowing greatest at the C5-6 level. Upper chest: Multiple scattered 2-3 mm nodules in the lung apices. Other: Left thyroid nodules measuring up to 30 mm. Calcific atherosclerosis of carotid bifurcations. IMPRESSION: 1. No acute intracranial abnormality identified. 2. No acute fracture or dislocation of the cervical spine. 3. Stable mild parenchymal volume loss and chronic microvascular ischemic changes of the brain. 4. Cervical spondylosis predominantly from the C5 through C7 levels. No high-grade bony canal stenosis. 5. Left thyroid nodules measuring up to 30 mm. Further evaluation with thyroid ultrasound is recommended on a nonemergent basis. 6. Several 2-3 mm pulmonary nodules in the lung apices. No follow-up needed if patient is low-risk (and has no known or suspected primary neoplasm). Non-contrast chest CT can be considered in 12 months if patient is high-risk. This recommendation follows the consensus statement: Guidelines for Management of Incidental Pulmonary Nodules Detected on CT Images: From the Fleischner Society 2017; Radiology 2017; 284:228-243. Electronically Signed   By: Mitzi Hansen M.D.   On: 02/17/2016 04:43   Dg Knee Complete 4 Views Right  Result Date: 02/17/2016 CLINICAL DATA:  Pain after a fall this evening. EXAM: RIGHT KNEE - COMPLETE 4+ VIEW COMPARISON:  None. FINDINGS: Degenerative changes in the right knee. Slight contour irregularities on the femoral condyles likely representing osteochondral defects. No evidence of acute fracture or  dislocation. No significant effusion. Soft tissues are unremarkable. IMPRESSION: No acute bony abnormalities. Degenerative changes in the knee with probable osteochondral defects. Electronically Signed   By: Burman Nieves M.D.   On: 02/17/2016 04:26   Dg Hip Unilat W Or Wo Pelvis 2-3 Views Right  Result Date: 02/17/2016 CLINICAL DATA:  Pain after a fall. EXAM: DG HIP (WITH OR WITHOUT PELVIS) 2-3V RIGHT COMPARISON:  06/29/2014 FINDINGS: Displaced fracture of the right superior and inferior pubic rami, new since previous study, and probably acute. Old fracture deformities of the left pubic rami. Degenerative changes in the hips. SI joints and symphysis pubis are not displaced. No evidence of fracture or dislocation of the right hip. IMPRESSION: Acute appearing displaced fractures involving the superior and inferior right pubic rami. Old fracture deformities of the left pubic rami. Degenerative changes in the hips. Electronically Signed   By: Burman Nieves M.D.   On: 02/17/2016 04:25    (Echo, Carotid, EGD, Colonoscopy, ERCP)   Subjective: Pt without complaints.   Discharge Exam: Vitals:   02/19/16 0438 02/19/16 0540  BP: (!) 160/92 132/77  Pulse: (!) 101 84  Resp: 16   Temp: 97.4 F (36.3 C)    Vitals:   02/18/16 2208 02/19/16 0100 02/19/16 0438 02/19/16 0540  BP: 114/79  (!) 160/92 132/77  Pulse: 80  (!) 101 84  Resp:   16   Temp:   97.4 F (36.3 C)   TempSrc:   Oral   SpO2:   94%   Weight:  51.3 kg (113 lb 1.5 oz)    Height:  5\' 5"  (1.651 m)     Constitutional: She appears well-developed.  HENT: Head: Normocephalic and atraumatic.  Neck: Normal range of motion. Neck supple.  Respiratory: Effort normal.  GI: Soft. Normal BS.  Musculoskeletal: Right shoulder: She exhibits decreased range of motion, tenderness and bony tenderness. Right hip: She exhibits decreased range of motion and bony tenderness.  Neurological: She is alert.   The results of significant diagnostics from this hospitalization (including imaging, microbiology, ancillary and laboratory) are listed below for reference.     Microbiology: No results found for this or any previous visit (from the past 240 hour(s)).    Labs: BNP (last 3 results) No results for input(s): BNP in the last 8760 hours. Basic Metabolic Panel:  Recent Labs Lab 02/17/16 0510 02/18/16 0656 02/19/16 0449  NA 140 139 140  K 4.2 3.9 4.0  CL 108 107 108  CO2 24 25 27   GLUCOSE 141* 106* 107*  BUN 34* 23* 23*  CREATININE 1.17* 0.89 0.74  CALCIUM 9.3 8.5* 9.1   Liver Function Tests: No results for input(s): AST, ALT, ALKPHOS, BILITOT, PROT, ALBUMIN in the last 168 hours. No results for input(s): LIPASE, AMYLASE in the last 168 hours. No results for input(s): AMMONIA in the last 168 hours. CBC:  Recent Labs Lab 02/17/16 0510  WBC 9.6  NEUTROABS 8.5*  HGB 11.7*  HCT 34.8*  MCV 90.2  PLT 102*   Cardiac Enzymes: No results for input(s): CKTOTAL, CKMB, CKMBINDEX, TROPONINI in the last 168 hours. BNP: Invalid input(s): POCBNP CBG: No results for input(s): GLUCAP in the last 168 hours. D-Dimer No results for input(s): DDIMER in the last 72 hours. Hgb A1c No results for input(s): HGBA1C in the last 72 hours. Lipid Profile No results for input(s): CHOL, HDL, LDLCALC, TRIG, CHOLHDL, LDLDIRECT in the last 72 hours. Thyroid function studies No results for input(s): TSH, T4TOTAL, T3FREE, THYROIDAB  in the last 72 hours.  Invalid input(s): FREET3 Anemia work up No results for input(s): VITAMINB12, FOLATE, FERRITIN, TIBC, IRON, RETICCTPCT in the last 72 hours. Urinalysis    Component Value Date/Time   COLORURINE YELLOW 01/01/2016 1420   APPEARANCEUR CLEAR 01/01/2016 1420   LABSPEC 1.014 01/01/2016 1420   PHURINE 5.0 01/01/2016 1420   GLUCOSEU NEGATIVE 01/01/2016 1420   HGBUR MODERATE (A) 01/01/2016 1420   BILIRUBINUR NEGATIVE 01/01/2016 1420   KETONESUR 5 (A) 01/01/2016 1420   PROTEINUR NEGATIVE 01/01/2016 1420   UROBILINOGEN 0.2 01/03/2013 1701   NITRITE NEGATIVE 01/01/2016 1420   LEUKOCYTESUR NEGATIVE 01/01/2016 1420   Sepsis Labs Invalid input(s): PROCALCITONIN,  WBC,  LACTICIDVEN Microbiology No  results found for this or any previous visit (from the past 240 hour(s)).  Time coordinating discharge: 31 minutes  SIGNED:  Standley Dakinslanford Jezabella Schriever, MD  [facility] 02/19/2016, 12:01 PM Pager   If 7PM-7AM, please contact night-coverage [email or web address] Password [email or web address]

## 2016-02-19 NOTE — ED Triage Notes (Signed)
Per [facility]  Pt coming from [facility]. [facility]. Pt being sent here with HTN and tachycardia and fever. Pt recently diagnosed with pelvic fx and [facility]. Pt Afebrile with EMS

## 2016-02-19 NOTE — Discharge Instructions (Addendum)
Her labs tonight are stable and her urine did not show any infection. Her BP in the ED have been below 130 systolic except for one when she was agitated about her monitor wires.

## 2016-02-19 NOTE — Clinical Social Work Note (Signed)
Clinical Social Work Assessment  Patient Details  Name: Yolanda Shaw MRN: [id number] Date of Birth: [birth year]  Date of referral:  02/19/16               Reason for consult:  Facility Placement, Discharge Planning                Permission sought to share information with:  Family Supports, Customer service manager, Case Optician, dispensing granted to share information::  Yes, Verbal Permission Granted  Name::      ([name] Mee Hives and [name])  Agency::   ([facility] )  Relationship::   (Son and Daughter )  Sport and exercise psychologist Information:   [phone])  Housing/Transportation Living arrangements for the past 2 months:  [place] of Information:  Adult Children Patient Interpreter Needed:  None Criminal Activity/Legal Involvement Pertinent to Current Situation/Hospitalization:  No - Comment as needed Significant Relationships:  Adult Children Lives with:  Adult Children Do you feel safe going back to the place where you live?  No Need for family participation in patient care:  No (Coment)  Care giving concerns:  Patient admitted from home where she has had recurrent falls in the past week.    Social Worker assessment / plan:  MSW met with patient and pt's family for post-acute placement for SNF. MSW introduced MSW role and SNF process. Family is familiar with [facility]. Patient has had recurrent falls over the past week and X-ray shows pelvic fracture. Surgical intervention currently not recommended. Patient is also under OBS and will not qualify under Medicare guidelines for STR at SNF. Family has expressed understanding of patient being admitted and remaining under OBS and that STR at SNF will be private pay. Family requesting placement at [facility]. MSW has contacted facility for estimated cost of STR- daily: $265 30 days required in advance: $7950. Family understands in the the event patient does not remain at facility for 30 days- monies  will be reimbursed. Patient's son, Wille Glaser wife has financial POA and will make arrangements for [phone] cashier's check. Facility will accept patient after 2pm today, 2/14. No further concerns reported at this time. MSW remains available as needed.   Employment status:  Retired Health visitor PT Recommendations:  Home with [employer], [place] / Referral to community resources:  [place]  Patient/Family's Response to care:  Patient alert and oriented to person and place. Patient's dtr, [name] at bedside and provided MSW with patient's son, Wille Glaser work number. Patient's family supportive and strongly involved in patient's care. Pt's family appreciated social work intervention.   Patient/Family's Understanding of and Emotional Response to Diagnosis, Current Treatment, and Prognosis:  Patient's family knowledgeable of medical interventions and recommendation for non-surgical interventions at this time.   Emotional Assessment Appearance:  Appears stated age Attitude/Demeanor/Rapport:  Unable to Assess Affect (typically observed):  Unable to Assess Orientation:  Oriented to Self Alcohol / Substance use:  Not Applicable Psych involvement (Current and /or in the community):  No (Comment)  Discharge Needs  Concerns to be addressed:  Care Coordination Readmission within the last 30 days:  No Current discharge risk:  Dependent with Mobility, Cognitively Impaired Barriers to Discharge:  Barriers Resolved   Glendon Axe A 02/19/2016, 11:07 AM

## 2016-02-19 NOTE — Discharge Instructions (Signed)
She has right-sided pubic rami fractures and a right proximal humerus fracture.  These can both be treated conservatively with mobility and weight-bearing as tolerated.  A sling can be placed around her right shoulder for comfort, but she can use a walker as needed with that arm.  Both her pubic rami fractures and right proximal humerus fractures can be treated conservatively with pain control and early mobility.   PT/OT for mobility with WBAT for both areas.  No right shoulder abduction for now.

## 2016-02-19 NOTE — Clinical Social Work Placement (Signed)
   CLINICAL SOCIAL WORK PLACEMENT  NOTE  Date:  02/19/2016  Patient Details  Name: Yolanda Shaw MRN: [id number] Date of Birth: 05/29/1931  Clinical Social Work is seeking post-discharge placement for this patient at the Skilled  Nursing Facility level of care (*CSW will initial, date and re-position this form in  chart as items are completed):  Yes   Patient/family provided with [facility] Clinical Social Work Department's list of facilities offering this level of care within the geographic area requested by the patient (or if unable, by the patient's family).  Yes   Patient/family informed of their freedom to choose among providers that offer the needed level of care, that participate in Medicare, Medicaid or managed care program needed by the patient, have an available bed and are willing to accept the patient.  Yes   Patient/family informed of [facility] ownership interest in [facility] and [facility], as well as of the fact that they are under no obligation to receive care at these facilities.  PASRR submitted to EDS on 02/18/16     PASRR number received on       Existing PASRR number confirmed on 02/18/16     FL2 transmitted to all facilities in geographic area requested by pt/family on 02/18/16     FL2 transmitted to all facilities within larger geographic area on       Patient informed that his/her managed care company has contracts with or will negotiate with certain facilities, including the following:        Yes   Patient/family informed of bed offers received.  Patient chooses bed at  [facility]     Physician recommends and patient chooses bed at      Patient to be transferred to  [facility] on 02/19/16.  Patient to be transferred to facility by  Sharin Mons([facility]AR )     Patient family notified on 02/19/16 of transfer.  Name of family member notified:   (Pt's son, Gabriel RungJoe )     PHYSICIAN Please prepare priority discharge  summary, including medications, Please sign FL2, Please prepare prescriptions     Additional Comment:    _______________________________________________ Derenda FennelNixon, Yolanda Shaw 02/19/2016, 11:19 AM

## 2016-02-19 NOTE — Clinical Social Work Note (Signed)
MSW has attempted to contact patient's sons: Yolanda Shaw and Yolanda Shaw in regards to discharge planning and [facility].   Derenda FennelBashira Kamuela Magos, MSW [phone] 02/19/2016 9:34 AM

## 2016-02-19 NOTE — Progress Notes (Signed)
Discharge packet given to [facility] to take to [facility]. Report called into [facility] nurse, Laurel HillMisty Cox with all questions answered. Instructions also given to son who is at bedside.

## 2016-02-19 NOTE — ED Provider Notes (Signed)
[facility]-EMERGENCY DEPT Provider Note   CSN: [id number] Arrival date & time: 02/19/16  2209     History   Chief Complaint Chief Complaint  Patient presents with  . Hypertension  . Fever  . Tachycardia   Level V caveat secondary to dementia history obtained from family members  HPI Kennadee Walthour is a 83 y.o. female.  HPI   This is an 83 year old female that was discharged today after admission for pubic ramus fracture and shoulder fracture after mechanical fall at home. She has some baseline dementia. She was discharged to a facility which was new for her. Tonight she became agitated and was sent back to the emergency department with report of high blood pressure, fever, and tachycardia. Her family members who are here states that she was agitated earlier in the evening. After they went home there immediately called back and told that they were being sent to the hospital. She was given Percocet by the facility prior to transfer  Past Medical History:  Diagnosis Date  . Dementia     Patient Active Problem List   Diagnosis Date Noted  . Closed stable fracture of multiple pubic rami (HCC) 02/17/2016  . Closed fracture of right proximal humerus   . Elevated BP without diagnosis of hypertension   . AKI (acute kidney injury) (HCC)   . Dementia with behavioral disturbance   . Cramping affecting pregnancy, antepartum 11/20/2014  . Pelvic fracture (HCC) 06/30/2014  . Thrombocytopenia (HCC) 06/30/2014  . Tremor 06/30/2014  . Pressure ulcer 06/30/2014  . Fracture of left pubis (HCC)   . Ischium fracture (HCC)   . Left hip pain   . Bilateral pubic rami fractures (HCC) 06/29/2014    Past Surgical History:  Procedure Laterality Date  . ADENOIDECTOMY    . SHOULDER SURGERY     right  . TONSILLECTOMY    . WRIST SURGERY      OB History    No data available       Home Medications    Prior to Admission medications   Medication Sig Start Date End Date Taking? Authorizing  Provider  acetaminophen (TYLENOL) 325 MG tablet Take 2 tablets (650 mg total) by mouth every 8 (eight) hours. 02/19/16   Clanford Cyndie Mull, MD  oxyCODONE (OXY IR/ROXICODONE) 5 MG immediate release tablet Take 1 tablet (5 mg total) by mouth every 4 (four) hours as needed for severe pain. 02/19/16   Clanford Cyndie Mull, MD  senna (SENOKOT) 8.6 MG TABS tablet Take 1 tablet (8.6 mg total) by mouth daily. 02/19/16   Clanford Cyndie Mull, MD    Family History No family history on file.  Social History Social History  Substance Use Topics  . Smoking status: Former Smoker    Quit date: 01/01/1947  . Smokeless tobacco: Never Used  . Alcohol use Yes     Comment: occassional wine     Allergies   Ether; Amitriptyline; and Penicillins   Review of Systems Review of Systems  Unable to perform ROS: Dementia     Physical Exam Updated Vital Signs BP 182/96 (BP Location: Right Arm)   Pulse 89   Temp 99.5 F (37.5 C) (Rectal)   Resp 12   Ht 5\' 6"  (1.676 m)   Wt 51.3 kg   SpO2 93%   BMI 18.24 kg/m   Physical Exam  Constitutional: She appears well-developed and well-nourished. No distress.  HENT:  Head: Normocephalic and atraumatic.  Right Ear: External ear normal.  Left Ear: External  ear normal.  Nose: Nose normal.  Mouth/Throat: Oropharynx is clear and moist.  Eyes: Pupils are equal, round, and reactive to light.  Neck: Normal range of motion.  Cardiovascular: Normal rate and regular rhythm.   Pulmonary/Chest: Effort normal and breath sounds normal.  Abdominal: Soft. Bowel sounds are normal.  Musculoskeletal: Normal range of motion.  Neurological: She is alert. No cranial nerve deficit.  Skin: Skin is warm and dry. Capillary refill takes less than 2 seconds.  Nursing note and vitals reviewed.    ED Treatments / Results  Labs (all labs ordered are listed, but only abnormal results are displayed) Labs Reviewed  CBC  COMPREHENSIVE METABOLIC PANEL  URINALYSIS, ROUTINE W REFLEX  MICROSCOPIC    EKG  EKG Interpretation None       Radiology No results found.  Procedures Procedures (including critical care time)  Medications Ordered in ED Medications - No data to display   Initial Impression / Assessment and Plan / ED Course  I have reviewed the triage vital signs and the nursing notes.  Pertinent labs & imaging results that were available during my care of the patient were reviewed by me and considered in my medical decision making (see chart for details).     This is an 83 year old female with dementia, pelvic fracture, shoulder fracture who presents tonight from facility where she was just admitted today with delirium consistent with sundowning. She is being evaluated here for any source of infection or other acute metabolic abnormality. Rectal temp was obtained is 99. Urine, cbc, cmet pending.  Will sign out to Dr. Lynelle DoctorKnapp with possible discharge back to facility if labs normal.   Final Clinical Impressions(s) / ED Diagnoses   Final diagnoses:  Dementia with behavioral disturbance, unspecified dementia type    New Prescriptions New Prescriptions   No medications on file     Margarita Grizzleanielle Jerrard Bradburn, MD 02/19/16 2353

## 2016-02-20 DIAGNOSIS — R000 Tachycardia, unspecified: Secondary | ICD-10-CM | POA: Diagnosis not present

## 2016-02-20 DIAGNOSIS — F0391 Unspecified dementia with behavioral disturbance: Secondary | ICD-10-CM | POA: Diagnosis not present

## 2016-02-20 DIAGNOSIS — R509 Fever, unspecified: Secondary | ICD-10-CM | POA: Diagnosis not present

## 2016-02-20 DIAGNOSIS — R41841 Cognitive communication deficit: Secondary | ICD-10-CM | POA: Diagnosis not present

## 2016-02-20 DIAGNOSIS — R1312 Dysphagia, oropharyngeal phase: Secondary | ICD-10-CM | POA: Diagnosis not present

## 2016-02-20 DIAGNOSIS — Z9181 History of falling: Secondary | ICD-10-CM | POA: Diagnosis not present

## 2016-02-20 DIAGNOSIS — M6281 Muscle weakness (generalized): Secondary | ICD-10-CM | POA: Diagnosis not present

## 2016-02-20 LAB — URINALYSIS, MICROSCOPIC (REFLEX): WBC, UA: NONE SEEN WBC/hpf (ref 0–5)

## 2016-02-20 LAB — CBC
HEMATOCRIT: 32.7 % — AB (ref 36.0–46.0)
HEMOGLOBIN: 10.7 g/dL — AB (ref 12.0–15.0)
MCH: 29.7 pg (ref 26.0–34.0)
MCHC: 32.7 g/dL (ref 30.0–36.0)
MCV: 90.8 fL (ref 78.0–100.0)
Platelets: 115 10*3/uL — ABNORMAL LOW (ref 150–400)
RBC: 3.6 MIL/uL — ABNORMAL LOW (ref 3.87–5.11)
RDW: 13.8 % (ref 11.5–15.5)
WBC: 6.7 10*3/uL (ref 4.0–10.5)

## 2016-02-20 LAB — URINALYSIS, ROUTINE W REFLEX MICROSCOPIC
Bilirubin Urine: NEGATIVE
Glucose, UA: NEGATIVE mg/dL
Ketones, ur: >80 mg/dL — AB
LEUKOCYTES UA: NEGATIVE
Nitrite: NEGATIVE
PROTEIN: NEGATIVE mg/dL
SPECIFIC GRAVITY, URINE: 1.02 (ref 1.005–1.030)
pH: 6 (ref 5.0–8.0)

## 2016-02-20 LAB — COMPREHENSIVE METABOLIC PANEL
ALT: 16 U/L (ref 14–54)
ANION GAP: 12 (ref 5–15)
AST: 25 U/L (ref 15–41)
Albumin: 3.2 g/dL — ABNORMAL LOW (ref 3.5–5.0)
Alkaline Phosphatase: 68 U/L (ref 38–126)
BILIRUBIN TOTAL: 1.5 mg/dL — AB (ref 0.3–1.2)
BUN: 16 mg/dL (ref 6–20)
CALCIUM: 8.8 mg/dL — AB (ref 8.9–10.3)
CO2: 24 mmol/L (ref 22–32)
Chloride: 102 mmol/L (ref 101–111)
Creatinine, Ser: 0.78 mg/dL (ref 0.44–1.00)
Glucose, Bld: 109 mg/dL — ABNORMAL HIGH (ref 65–99)
Potassium: 3.7 mmol/L (ref 3.5–5.1)
Sodium: 138 mmol/L (ref 135–145)
TOTAL PROTEIN: 5.4 g/dL — AB (ref 6.5–8.1)

## 2016-02-20 MED ORDER — FENTANYL CITRATE (PF) 100 MCG/2ML IJ SOLN
50.0000 ug | Freq: Once | INTRAMUSCULAR | Status: AC
  Administered 2016-02-20: 50 ug via RESPIRATORY_TRACT
  Filled 2016-02-20: qty 2

## 2016-02-20 MED ORDER — OXYCODONE-ACETAMINOPHEN 5-325 MG PO TABS
1.0000 | ORAL_TABLET | Freq: Once | ORAL | Status: AC
  Administered 2016-02-20: 1 via ORAL
  Filled 2016-02-20: qty 1

## 2016-02-20 NOTE — ED Provider Notes (Signed)
Pt left at change of shift to get results of labs.   01:10 Pt is agitated, trying to take off her leads, she is moving her legs well. Family given her test results.  Will repeat her BP before discharge, she has one elevated BP. Family member states the doctor at the NH said they send anyone with BP over 130 systolic to the hospital for treatment.   Results for orders placed or performed during the hospital encounter of 02/19/16  CBC  Result Value Ref Range   WBC 6.7 4.0 - 10.5 K/uL   RBC 3.60 (L) 3.87 - 5.11 MIL/uL   Hemoglobin 10.7 (L) 12.0 - 15.0 g/dL   HCT 69.632.7 (L) 29.536.0 - 28.446.0 %   MCV 90.8 78.0 - 100.0 fL   MCH 29.7 26.0 - 34.0 pg   MCHC 32.7 30.0 - 36.0 g/dL   RDW 13.213.8 44.011.5 - 10.215.5 %   Platelets 115 (L) 150 - 400 K/uL  Comprehensive metabolic panel  Result Value Ref Range   Sodium 138 135 - 145 mmol/L   Potassium 3.7 3.5 - 5.1 mmol/L   Chloride 102 101 - 111 mmol/L   CO2 24 22 - 32 mmol/L   Glucose, Bld 109 (H) 65 - 99 mg/dL   BUN 16 6 - 20 mg/dL   Creatinine, Ser 7.250.78 0.44 - 1.00 mg/dL   Calcium 8.8 (L) 8.9 - 10.3 mg/dL   Total Protein 5.4 (L) 6.5 - 8.1 g/dL   Albumin 3.2 (L) 3.5 - 5.0 g/dL   AST 25 15 - 41 U/L   ALT 16 14 - 54 U/L   Alkaline Phosphatase 68 38 - 126 U/L   Total Bilirubin 1.5 (H) 0.3 - 1.2 mg/dL   GFR calc non Af Amer >60 >60 mL/min   GFR calc Af Amer >60 >60 mL/min   Anion gap 12 5 - 15  Urinalysis, Routine w reflex microscopic  Result Value Ref Range   Color, Urine YELLOW YELLOW   APPearance CLEAR CLEAR   Specific Gravity, Urine 1.020 1.005 - 1.030   pH 6.0 5.0 - 8.0   Glucose, UA NEGATIVE NEGATIVE mg/dL   Hgb urine dipstick LARGE (A) NEGATIVE   Bilirubin Urine NEGATIVE NEGATIVE   Ketones, ur >80 (A) NEGATIVE mg/dL   Protein, ur NEGATIVE NEGATIVE mg/dL   Nitrite NEGATIVE NEGATIVE   Leukocytes, UA NEGATIVE NEGATIVE  Urinalysis, Microscopic (reflex)  Result Value Ref Range   RBC / HPF 6-30 0 - 5 RBC/hpf   WBC, UA NONE SEEN 0 - 5 WBC/hpf   Bacteria, UA RARE (A) NONE SEEN   Squamous Epithelial / LPF 0-5 (A) NONE SEEN   Laboratory interpretation all normal except hematuria c/w cath UA, mild stable anemia     Devoria AlbeIva Zuleica Seith, MD 02/20/16 [phone]

## 2016-02-21 ENCOUNTER — Non-Acute Institutional Stay (SKILLED_NURSING_FACILITY): Payer: Medicare Other | Admitting: Family

## 2016-02-21 ENCOUNTER — Encounter: Payer: Self-pay | Admitting: Family

## 2016-02-21 DIAGNOSIS — M25551 Pain in right hip: Secondary | ICD-10-CM

## 2016-02-21 DIAGNOSIS — F411 Generalized anxiety disorder: Secondary | ICD-10-CM

## 2016-02-21 DIAGNOSIS — M25511 Pain in right shoulder: Secondary | ICD-10-CM | POA: Diagnosis not present

## 2016-02-21 DIAGNOSIS — M6281 Muscle weakness (generalized): Secondary | ICD-10-CM | POA: Diagnosis not present

## 2016-02-21 DIAGNOSIS — Z9181 History of falling: Secondary | ICD-10-CM | POA: Diagnosis not present

## 2016-02-21 DIAGNOSIS — R41841 Cognitive communication deficit: Secondary | ICD-10-CM | POA: Diagnosis not present

## 2016-02-21 DIAGNOSIS — R1312 Dysphagia, oropharyngeal phase: Secondary | ICD-10-CM | POA: Diagnosis not present

## 2016-02-21 DIAGNOSIS — R300 Dysuria: Secondary | ICD-10-CM | POA: Diagnosis not present

## 2016-02-21 NOTE — Progress Notes (Signed)
Location:  [facility] Room Number: 703-P Place of Service:  SNF (31) Provider: Dinah Ngetich FNP-C   Georgann Housekeeper, MD  Patient Care Team: Georgann Housekeeper, MD as PCP - General (Internal Medicine)  Extended Emergency Contact Information Primary Emergency Contact: Gloris [place], [place] Home Phone: [phone] Mobile Phone: [phone] Relation: Son Secondary Emergency Contact: Rae [place] Home Phone: [phone] Mobile Phone: [phone] Relation: Son  Code Status:  Full Code  Goals of care: Advanced Directive information Advanced Directives 02/19/2016  Does Patient Have a Medical Advance Directive? No;Yes  Type of Advance Directive Healthcare Power of Attorney  Would patient like information on creating a medical advance directive? -     Chief Complaint  Patient presents with  . Acute Visit    Anxiety    HPI:  Pt is a 83 y.o. female seen today for an acute visit for evaluation of anxiety. She is here for short term rehabilitation post hospital admission from 2?12/2016-02/19/2016 for mechanical fall with injury to her pelvis and right shoulder. Her X-ray showed pelvic fracture of the right pubis and right single humerus fracture. Ortho consulted and recommended conservative treatment.She was also had mild delirium treated with Ativan but was discontinued due to increased sedation.On discharged it was recommended to avoid Benzo's if possible. she has a medical history of Dementia  among other conditions. She is seen in her room today with son at bedside. Patient's son states patient was send to ED 02/19/2016 due elevated heart rate.She returned to the facility with no new orders. Patient's daughter reported that HR elevated due to patient's anxiety and panic attacks.Patient's son states patient is much calmer this visit after getting her pain medication. He thinks whenever she is in  pain she gets agitated,increase anxiety and panic attack.   Past Medical History:  Diagnosis Date  . Dementia    Past Surgical History:  Procedure Laterality Date  . ADENOIDECTOMY    . SHOULDER SURGERY     right  . TONSILLECTOMY    . WRIST SURGERY      Allergies  Allergen Reactions  . Ether Other (See Comments)    Hard to wake up after procedure  . Amitriptyline Rash  . Penicillins Rash    Has patient had a PCN reaction causing immediate rash, facial/tongue/throat swelling, SOB or lightheadedness with hypotension: No Has patient had a PCN reaction causing severe rash involving mucus membranes or skin necrosis: No Has patient had a PCN reaction that required hospitalization No Has patient had a PCN reaction occurring within the last 10 years: No If all of the above answers are "NO", then may proceed with Cephalosporin use.    Allergies as of 02/21/2016      Reactions   Ether Other (See Comments)   Hard to wake up after procedure   Amitriptyline Rash   Penicillins Rash   Has patient had a PCN reaction causing immediate rash, facial/tongue/throat swelling, SOB or lightheadedness with hypotension: No Has patient had a PCN reaction causing severe rash involving mucus membranes or skin necrosis: No Has patient had a PCN reaction that required hospitalization No Has patient had a PCN reaction occurring within the last 10 years: No If all of the above answers are "NO", then may proceed with Cephalosporin use.      Medication List       Accurate as of 02/21/16  3:16 PM.  Always use your most recent med list.          acetaminophen 325 MG tablet Commonly known as:  TYLENOL Take 2 tablets (650 mg total) by mouth every 8 (eight) hours.   NON FORMULARY Magic cup and mighty shakes with all meals   oxyCODONE 5 MG immediate release tablet Commonly known as:  Oxy IR/ROXICODONE Take 1 tablet (5 mg total) by mouth every 4 (four) hours as needed for severe pain.   senna 8.6 MG  Tabs tablet Commonly known as:  SENOKOT Take 1 tablet (8.6 mg total) by mouth daily.       Review of Systems  Unable to perform ROS: Dementia    Immunization History  Administered Date(s) Administered  . PPD Test 07/02/2014   Pertinent  Health Maintenance Due  Topic Date Due  . DEXA SCAN  07/10/1998  . PNA vac Low Risk Adult (1 of 2 - PCV13) 07/10/1998  . INFLUENZA VACCINE  08/06/2015    Vitals:   02/21/16 1144  BP: 114/76  Pulse: 100  Resp: 16  Temp: 98.2 F (36.8 C)  TempSrc: Oral  SpO2: 93%  Weight: 105 lb (47.6 kg)  Height: 5\' 2"  (1.575 m)   Body mass index is 19.2 kg/m. Physical Exam  Constitutional: She appears well-developed.  Thin elderly in no acute distress  HENT:  Head: Normocephalic.  Mouth/Throat: Oropharynx is clear and moist. No oropharyngeal exudate.  Eyes: Conjunctivae and EOM are normal. Pupils are equal, round, and reactive to light. Right eye exhibits no discharge. Left eye exhibits no discharge. No scleral icterus.  Neck: Neck supple. No JVD present. No thyromegaly present.  Cardiovascular: Normal rate, regular rhythm, normal heart sounds and intact distal pulses.  Exam reveals no gallop and no friction rub.   No murmur heard. Pulmonary/Chest: Effort normal and breath sounds normal. No respiratory distress. She has no wheezes. She has no rales.  Abdominal: Soft. Bowel sounds are normal. She exhibits no distension. There is no tenderness. There is no rebound and no guarding.  Genitourinary:  Genitourinary Comments: Incontinent   Musculoskeletal: She exhibits no edema, tenderness or deformity.  Moves x 4 extremities.Limited ROM to right shoulder /hip due to pain.unsteady gait   Lymphadenopathy:    She has no cervical adenopathy.  Neurological: She is alert.  Skin: Skin is warm and dry. No rash noted. No erythema.  Psychiatric: She has a normal mood and affect.    Labs reviewed:  Recent Labs  02/18/16 0656 02/19/16 0449 02/19/16 2323    NA 139 140 138  K 3.9 4.0 3.7  CL 107 108 102  CO2 25 27 24   GLUCOSE 106* 107* 109*  BUN 23* 23* 16  CREATININE 0.89 0.74 0.78  CALCIUM 8.5* 9.1 8.8*    Recent Labs  02/19/16 2323  AST 25  ALT 16  ALKPHOS 68  BILITOT 1.5*  PROT 5.4*  ALBUMIN 3.2*    Recent Labs  01/01/16 1300 02/17/16 0510 02/19/16 2323  WBC 5.8 9.6 6.7  NEUTROABS  --  8.5*  --   HGB 15.0 11.7* 10.7*  HCT 44.8 34.8* 32.7*  MCV 91.1 90.2 90.8  PLT 158 102* 115*   Lab Results  Component Value Date   TSH 0.235 (L) 11/20/2014    Assessment/Plan  GAD (generalized anxiety disorder) Patient's son reports patient is calmer whenever right hip pain is under control. No antianxiety med for now. Will continue to monitor.continue to avoid Benzo's. Will consider initiating sertraline if symptoms  persist. Follow up with Pyschiatry service. Urine specimen for U/A and C/S ordered to rule out UTI .   Right hip/Shoulder pain  Current pain regimen effective. Continue to monitor. PMR consult for pain management.      Family/ staff Communication: Reviewed plan of care with patient's Son and facility Nurse.  Labs/tests ordered:  Urine specimen for U/A and C/S ordered to rule out UTI

## 2016-02-22 DIAGNOSIS — M6281 Muscle weakness (generalized): Secondary | ICD-10-CM | POA: Diagnosis not present

## 2016-02-22 DIAGNOSIS — Z9181 History of falling: Secondary | ICD-10-CM | POA: Diagnosis not present

## 2016-02-22 DIAGNOSIS — R1312 Dysphagia, oropharyngeal phase: Secondary | ICD-10-CM | POA: Diagnosis not present

## 2016-02-22 DIAGNOSIS — R41841 Cognitive communication deficit: Secondary | ICD-10-CM | POA: Diagnosis not present

## 2016-02-23 DIAGNOSIS — M6281 Muscle weakness (generalized): Secondary | ICD-10-CM | POA: Diagnosis not present

## 2016-02-23 DIAGNOSIS — R41841 Cognitive communication deficit: Secondary | ICD-10-CM | POA: Diagnosis not present

## 2016-02-23 DIAGNOSIS — Z9181 History of falling: Secondary | ICD-10-CM | POA: Diagnosis not present

## 2016-02-23 DIAGNOSIS — R1312 Dysphagia, oropharyngeal phase: Secondary | ICD-10-CM | POA: Diagnosis not present

## 2016-02-24 DIAGNOSIS — R1312 Dysphagia, oropharyngeal phase: Secondary | ICD-10-CM | POA: Diagnosis not present

## 2016-02-24 DIAGNOSIS — Z9181 History of falling: Secondary | ICD-10-CM | POA: Diagnosis not present

## 2016-02-24 DIAGNOSIS — M79609 Pain in unspecified limb: Secondary | ICD-10-CM | POA: Diagnosis not present

## 2016-02-24 DIAGNOSIS — M6281 Muscle weakness (generalized): Secondary | ICD-10-CM | POA: Diagnosis not present

## 2016-02-24 DIAGNOSIS — R41841 Cognitive communication deficit: Secondary | ICD-10-CM | POA: Diagnosis not present

## 2016-02-25 ENCOUNTER — Non-Acute Institutional Stay (SKILLED_NURSING_FACILITY): Payer: Medicare Other | Admitting: Family

## 2016-02-25 ENCOUNTER — Encounter: Payer: Self-pay | Admitting: Family

## 2016-02-25 DIAGNOSIS — F0391 Unspecified dementia with behavioral disturbance: Secondary | ICD-10-CM

## 2016-02-25 DIAGNOSIS — N3001 Acute cystitis with hematuria: Secondary | ICD-10-CM | POA: Diagnosis not present

## 2016-02-25 DIAGNOSIS — M6281 Muscle weakness (generalized): Secondary | ICD-10-CM | POA: Diagnosis not present

## 2016-02-25 DIAGNOSIS — Z9181 History of falling: Secondary | ICD-10-CM | POA: Diagnosis not present

## 2016-02-25 DIAGNOSIS — R41841 Cognitive communication deficit: Secondary | ICD-10-CM | POA: Diagnosis not present

## 2016-02-25 DIAGNOSIS — R1312 Dysphagia, oropharyngeal phase: Secondary | ICD-10-CM | POA: Diagnosis not present

## 2016-02-25 DIAGNOSIS — M79609 Pain in unspecified limb: Secondary | ICD-10-CM | POA: Diagnosis not present

## 2016-02-25 MED ORDER — SACCHAROMYCES BOULARDII 250 MG PO CAPS: 250.0000 mg | ORAL_CAPSULE | Freq: Two times a day (BID) | ORAL | 0 refills | Status: AC

## 2016-02-25 MED ORDER — CIPROFLOXACIN HCL 500 MG PO TABS: 500.0000 mg | ORAL_TABLET | Freq: Two times a day (BID) | ORAL | 0 refills | Status: AC

## 2016-02-25 NOTE — Progress Notes (Signed)
Location:  [facility] Room Number: 703-P Place of Service:  SNF (31) Provider: Rikki Smestad FNP-C   Georgann Housekeeper, MD  Patient Care Team: Georgann Housekeeper, MD as PCP - General (Internal Medicine)  Extended Emergency Contact Information Primary Emergency Contact: Rae Mar [place] Home Phone: [phone] Mobile Phone: [phone] Relation: Son Secondary Emergency Contact: Magdalen Spatz Address: [address]          [place], [place] [address] [place] Mobile Phone: [phone] Relation: Son  Code Status:  Full Code  Goals of care: Advanced Directive information Advanced Directives 02/19/2016  Does Patient Have a Medical Advance Directive? No;Yes  Type of Advance Directive Healthcare Power of Attorney  Would patient like information on creating a medical advance directive? -     Chief Complaint  Patient presents with  . Acute Visit    abnormal labs     HPI:  Pt is a 83 y.o. female seen today for an acute visit for evaluation of abnormal labs.She is seen in her room today with son at bedside. Facility staff and son reports patient continues to be confused. Patient's son request medications for dementia. Her recent urine analysis results showed turbid urine with trace ketone, trace blood and negative nitrites. Urine cultures showed > 100, 000 colonies E.coli ESBL negative. No fever or chills reported. Discussed with patient's son treated for UTI then patient will follow up with Psychiatry service for follow up for dementia.   Past Medical History:  Diagnosis Date  . Dementia    Past Surgical History:  Procedure Laterality Date  . ADENOIDECTOMY    . SHOULDER SURGERY     right  . TONSILLECTOMY    . WRIST SURGERY      Allergies  Allergen Reactions  . Ether Other (See Comments)    Hard to wake up after procedure  . Amitriptyline Rash  . Penicillins Rash    Has patient had a PCN reaction causing  immediate rash, facial/tongue/throat swelling, SOB or lightheadedness with hypotension: No Has patient had a PCN reaction causing severe rash involving mucus membranes or skin necrosis: No Has patient had a PCN reaction that required hospitalization No Has patient had a PCN reaction occurring within the last 10 years: No If all of the above answers are "NO", then may proceed with Cephalosporin use.    Allergies as of 02/25/2016      Reactions   Ether Other (See Comments)   Hard to wake up after procedure   Amitriptyline Rash   Penicillins Rash   Has patient had a PCN reaction causing immediate rash, facial/tongue/throat swelling, SOB or lightheadedness with hypotension: No Has patient had a PCN reaction causing severe rash involving mucus membranes or skin necrosis: No Has patient had a PCN reaction that required hospitalization No Has patient had a PCN reaction occurring within the last 10 years: No If all of the above answers are "NO", then may proceed with Cephalosporin use.      Medication List       Accurate as of 02/25/16  2:56 PM. Always use your most recent med list.          acetaminophen 325 MG tablet Commonly known as:  TYLENOL Take 2 tablets (650 mg total) by mouth every 8 (eight) hours.   haloperidol lactate 5 MG/ML injection Commonly known as:  HALDOL Inject 5 mg into the muscle every 6 (six) hours as needed.   NON FORMULARY Magic cup  and mighty shakes with all meals   oxyCODONE 5 MG immediate release tablet Commonly known as:  Oxy IR/ROXICODONE Take 1 tablet (5 mg total) by mouth every 4 (four) hours as needed for severe pain.   senna 8.6 MG Tabs tablet Commonly known as:  SENOKOT Take 1 tablet (8.6 mg total) by mouth daily.       Review of Systems  Unable to perform ROS: Dementia    Immunization History  Administered Date(s) Administered  . PPD Test 07/02/2014   Pertinent  Health Maintenance Due  Topic Date Due  . DEXA SCAN  07/10/1998  . PNA  vac Low Risk Adult (1 of 2 - PCV13) 07/10/1998  . INFLUENZA VACCINE  08/06/2015    Vitals:   02/25/16 1224  BP: (!) 144/88  Pulse: 100  Resp: 20  Temp: 97 F (36.1 C)  TempSrc: Oral  SpO2: 98%  Weight: 105 lb (47.6 kg)  Height: 5\' 2"  (1.575 m)   Body mass index is 19.2 kg/m. Physical Exam  Constitutional: She appears well-developed.   pleasantly confused thin elderly  HENT:  Head: Normocephalic.  Mouth/Throat: Oropharynx is clear and moist. No oropharyngeal exudate.  Eyes: Conjunctivae and EOM are normal. Pupils are equal, round, and reactive to light. Right eye exhibits no discharge. Left eye exhibits no discharge. No scleral icterus.  Neck: Neck supple. No JVD present. No thyromegaly present.  Cardiovascular: Normal rate, regular rhythm, normal heart sounds and intact distal pulses.  Exam reveals no gallop and no friction rub.   No murmur heard. Pulmonary/Chest: Effort normal and breath sounds normal. No respiratory distress. She has no wheezes. She has no rales.  Abdominal: Soft. Bowel sounds are normal. She exhibits no distension. There is no tenderness. There is no rebound and no guarding.  Genitourinary:  Genitourinary Comments: Incontinent   Musculoskeletal: She exhibits edema. She exhibits no tenderness or deformity.  Moves x 4 extremities.unsteady gait due to right hip pain   Lymphadenopathy:    She has no cervical adenopathy.  Neurological: She is alert.  pleasantly confused  Skin: Skin is warm and dry. No rash noted. No erythema.  Psychiatric: She has a normal mood and affect.    Labs reviewed:  Recent Labs  02/18/16 0656 02/19/16 0449 02/19/16 2323  NA 139 140 138  K 3.9 4.0 3.7  CL 107 108 102  CO2 25 27 24   GLUCOSE 106* 107* 109*  BUN 23* 23* 16  CREATININE 0.89 0.74 0.78  CALCIUM 8.5* 9.1 8.8*    Recent Labs  02/19/16 2323  AST 25  ALT 16  ALKPHOS 68  BILITOT 1.5*  PROT 5.4*  ALBUMIN 3.2*    Recent Labs  01/01/16 1300  02/17/16 0510 02/19/16 2323  WBC 5.8 9.6 6.7  NEUTROABS  --  8.5*  --   HGB 15.0 11.7* 10.7*  HCT 44.8 34.8* 32.7*  MCV 91.1 90.2 90.8  PLT 158 102* 115*   Lab Results  Component Value Date   TSH 0.235 (L) 11/20/2014    Assessment/Plan  Urinary tract infection.  Afebrile. Continue to be confused recent urine analysis results showed turbid urine with trace ketone, trace blood and negative nitrites. Urine cultures showed > 100, 000 colonies E.coli ESBL negative.Will start on Cipro 500 mg Tablet one by mouth twice daily x 7 days. Florastor 250 mg Capsule twice daily x 10 days for antibiotic associated diarrhea preventions.  Dementia   Has had increased confusion possible due to positive urinary tract infections.  Will treat for UTI. Consult Psychiatry for follow up.   Family/ staff Communication: Reviewed plan of care with patient's Son and facility Nurse.  Labs/tests ordered:  None

## 2016-02-26 ENCOUNTER — Ambulatory Visit (INDEPENDENT_AMBULATORY_CARE_PROVIDER_SITE_OTHER): Payer: Medicare Other | Admitting: Physician Assistant

## 2016-02-26 ENCOUNTER — Encounter (INDEPENDENT_AMBULATORY_CARE_PROVIDER_SITE_OTHER): Payer: Self-pay | Admitting: Orthopaedic Surgery

## 2016-02-26 VITALS — Ht 62.0 in | Wt 105.0 lb

## 2016-02-26 DIAGNOSIS — R1312 Dysphagia, oropharyngeal phase: Secondary | ICD-10-CM | POA: Diagnosis not present

## 2016-02-26 DIAGNOSIS — M898X2 Other specified disorders of bone, upper arm: Secondary | ICD-10-CM

## 2016-02-26 DIAGNOSIS — M6281 Muscle weakness (generalized): Secondary | ICD-10-CM | POA: Diagnosis not present

## 2016-02-26 DIAGNOSIS — R41841 Cognitive communication deficit: Secondary | ICD-10-CM | POA: Diagnosis not present

## 2016-02-26 DIAGNOSIS — M79621 Pain in right upper arm: Secondary | ICD-10-CM

## 2016-02-26 DIAGNOSIS — S32599A Other specified fracture of unspecified pubis, initial encounter for closed fracture: Secondary | ICD-10-CM

## 2016-02-26 DIAGNOSIS — Z9181 History of falling: Secondary | ICD-10-CM | POA: Diagnosis not present

## 2016-02-26 NOTE — Progress Notes (Signed)
Office Visit Note   Patient: Yolanda Shaw           Date of Birth: 31-Jan-1931           MRN: [id number] Visit Date: 02/26/2016              Requested by: Georgann Housekeeper, MD [address] [place], [place] [address] PCP: Georgann Housekeeper, MD   Assessment & Plan: Visit Diagnoses: No diagnosis found.  Plan: She is weightbearing as tolerated right lower leg and right humerus. We'll see her back on when necessary basis. The results of today's physical exam and recommendations were written in her report of consultation that was to accompany the patient back to her skilled facility.  Follow-Up Instructions: No Follow-up on file.   Orders:  No orders of the defined types were placed in this encounter.  No orders of the defined types were placed in this encounter.     Procedures: No procedures performed   Clinical Data: No additional findings.   Subjective: Chief Complaint  Patient presents with  . Pelvis - Fracture    S/p acute displaced fx superior and inferior right pubic rami fracture 02/17/16. Hx old fx left pubic rami.  . Right Upper Arm - Fracture    Right proximal humerus fracture. S/p fall out of bed. Seen in [facility] ER 02/17/16    HPI 83 year old female sustained a mechanical fall injuring her pelvis and right shoulder on 02/17/2016 and was seen by Dr. Magnus Ivan in the emergency room. She is here today for follow-up. She did sustain right superior and inferior pubic rami fractures. Some question if she sustained aproximal humerus fracture .  Patient has medical history significant for dementia. She percents to our office today without FAMILY members or anyone from skilled facility she resides at. Review of Systems   Objective: Vital Signs: Ht 5\' 2"  (1.575 m)   Wt 105 lb (47.6 kg)   BMI 19.20 kg/m   Physical Exam Well-developed well-nourished in no acute distress. Pleasant Ortho Exam Right shoulder hiking and abduction and internally externally rotate the  shoulder without pain. She has no tenderness with palpation along the humeral shaft particularly of the proximal portion and over the shoulder girdle. She has good range of motion of the elbow without pain. Radial pulses intact. Sensation motor grossly intact in the right hand. Right hip I'm able to internally externally rotate the hip with fluid motion without grimacing or complaining of any pain. Specialty Comments:  No specialty comments available.  Imaging: No results found.   PMFS History: Patient Active Problem List   Diagnosis Date Noted  . Closed stable fracture of multiple pubic rami (HCC) 02/17/2016  . Closed fracture of right proximal humerus   . Elevated BP without diagnosis of hypertension   . AKI (acute kidney injury) (HCC)   . Dementia with behavioral disturbance   . Cramping affecting pregnancy, antepartum 11/20/2014  . Pelvic fracture (HCC) 06/30/2014  . Thrombocytopenia (HCC) 06/30/2014  . Tremor 06/30/2014  . Pressure ulcer 06/30/2014  . Fracture of left pubis (HCC)   . Ischium fracture (HCC)   . Left hip pain   . Bilateral pubic rami fractures (HCC) 06/29/2014   Past Medical History:  Diagnosis Date  . Dementia     History reviewed. No pertinent family history.  Past Surgical History:  Procedure Laterality Date  . ADENOIDECTOMY    . SHOULDER SURGERY     right  . TONSILLECTOMY    . WRIST  SURGERY     Social History   Occupational History  . Not on file.   Social History Main Topics  . Smoking status: Former Smoker    Quit date: 01/01/1947  . Smokeless tobacco: Never Used  . Alcohol use Yes     Comment: occassional wine  . Drug use: No  . Sexual activity: Not on file

## 2016-02-27 ENCOUNTER — Encounter: Payer: Self-pay | Admitting: Internal Medicine

## 2016-02-27 DIAGNOSIS — R1312 Dysphagia, oropharyngeal phase: Secondary | ICD-10-CM | POA: Diagnosis not present

## 2016-02-27 DIAGNOSIS — Z9181 History of falling: Secondary | ICD-10-CM | POA: Diagnosis not present

## 2016-02-27 DIAGNOSIS — M6281 Muscle weakness (generalized): Secondary | ICD-10-CM | POA: Diagnosis not present

## 2016-02-27 DIAGNOSIS — M79609 Pain in unspecified limb: Secondary | ICD-10-CM | POA: Diagnosis not present

## 2016-02-27 DIAGNOSIS — R41841 Cognitive communication deficit: Secondary | ICD-10-CM | POA: Diagnosis not present

## 2016-02-27 NOTE — Progress Notes (Signed)
Review of Systems  Physical Exam

## 2016-02-27 NOTE — Patient Instructions (Signed)
Opened in error

## 2016-02-28 ENCOUNTER — Encounter: Payer: Self-pay | Admitting: Family

## 2016-02-28 DIAGNOSIS — Z9181 History of falling: Secondary | ICD-10-CM | POA: Diagnosis not present

## 2016-02-28 DIAGNOSIS — M6281 Muscle weakness (generalized): Secondary | ICD-10-CM | POA: Diagnosis not present

## 2016-02-28 DIAGNOSIS — R1312 Dysphagia, oropharyngeal phase: Secondary | ICD-10-CM | POA: Diagnosis not present

## 2016-02-28 DIAGNOSIS — R41841 Cognitive communication deficit: Secondary | ICD-10-CM | POA: Diagnosis not present

## 2016-02-28 DIAGNOSIS — F0391 Unspecified dementia with behavioral disturbance: Secondary | ICD-10-CM | POA: Diagnosis not present

## 2016-02-29 DIAGNOSIS — R41841 Cognitive communication deficit: Secondary | ICD-10-CM | POA: Diagnosis not present

## 2016-02-29 DIAGNOSIS — R1312 Dysphagia, oropharyngeal phase: Secondary | ICD-10-CM | POA: Diagnosis not present

## 2016-02-29 DIAGNOSIS — Z9181 History of falling: Secondary | ICD-10-CM | POA: Diagnosis not present

## 2016-02-29 DIAGNOSIS — M6281 Muscle weakness (generalized): Secondary | ICD-10-CM | POA: Diagnosis not present

## 2016-03-02 ENCOUNTER — Non-Acute Institutional Stay (SKILLED_NURSING_FACILITY): Payer: Medicare Other | Admitting: Internal Medicine

## 2016-03-02 ENCOUNTER — Encounter: Payer: Self-pay | Admitting: Internal Medicine

## 2016-03-02 DIAGNOSIS — S42294D Other nondisplaced fracture of upper end of right humerus, subsequent encounter for fracture with routine healing: Secondary | ICD-10-CM

## 2016-03-02 DIAGNOSIS — R1312 Dysphagia, oropharyngeal phase: Secondary | ICD-10-CM | POA: Diagnosis not present

## 2016-03-02 DIAGNOSIS — I482 Chronic atrial fibrillation, unspecified: Secondary | ICD-10-CM

## 2016-03-02 DIAGNOSIS — S32599A Other specified fracture of unspecified pubis, initial encounter for closed fracture: Secondary | ICD-10-CM | POA: Diagnosis not present

## 2016-03-02 DIAGNOSIS — M79609 Pain in unspecified limb: Secondary | ICD-10-CM | POA: Diagnosis not present

## 2016-03-02 DIAGNOSIS — R251 Tremor, unspecified: Secondary | ICD-10-CM

## 2016-03-02 DIAGNOSIS — D649 Anemia, unspecified: Secondary | ICD-10-CM | POA: Diagnosis not present

## 2016-03-02 DIAGNOSIS — I10 Essential (primary) hypertension: Secondary | ICD-10-CM

## 2016-03-02 DIAGNOSIS — N179 Acute kidney failure, unspecified: Secondary | ICD-10-CM

## 2016-03-02 DIAGNOSIS — Z9181 History of falling: Secondary | ICD-10-CM | POA: Diagnosis not present

## 2016-03-02 DIAGNOSIS — F0391 Unspecified dementia with behavioral disturbance: Secondary | ICD-10-CM

## 2016-03-02 DIAGNOSIS — R41841 Cognitive communication deficit: Secondary | ICD-10-CM | POA: Diagnosis not present

## 2016-03-02 DIAGNOSIS — M6281 Muscle weakness (generalized): Secondary | ICD-10-CM | POA: Diagnosis not present

## 2016-03-02 NOTE — Progress Notes (Signed)
Opened in error

## 2016-03-02 NOTE — Progress Notes (Signed)
Provider:  Murray Hodgkins, MD Location:  [facility] Room Number: [phone] Place of Service:  SNF (31)  PCP: Georgann Housekeeper, MD Patient Care Team: Georgann Housekeeper, MD as PCP - General (Internal Medicine)  Extended Emergency Contact Information Primary Emergency Contact: Rae Mar [place] Home Phone: [phone] Mobile Phone: [phone] Relation: Son Secondary Emergency Contact: Magdalen Spatz Address: [address]          [place], [place] [address] [place] Mobile Phone: [phone] Relation: Son  Code Status: Full Code Goals of Care: Advanced Directive information Advanced Directives 03/02/2016  Does Patient Have a Medical Advance Directive? No  Type of Advance Directive -  Would patient like information on creating a medical advance directive? -      Chief Complaint  Patient presents with  . New Admit To SNF    following hospitalization 02/17/16 to 02/19/16 for mechanical fall. Closed multiple fracture of right pubic rami and right side proximal humerus fracture.     HPI: Patient is a 83 y.o. female seen today for admission  And discharge to [facility]. Admitted 02/19/16. Anticipated discharge 03/03/16. Admitted following hospitalization from 02/17/16 to 02/19/16. She had a fall at home and sustained right proximal humerus fx and right superior and inferior pubic rami fx.  Other problems include Dementia, AKI, HTN.  These all improved.  She has a tachycardia at 11 BPM today. She denies dyspnea or chest pain. No dizziness. There is a history of atrial fibrillation and documentation on EKG in 2016.  Patient has progressed in therapies and there is an anticioated discharge to the care of her family tomaorrow.  Past Medical History:  Diagnosis Date  . Dementia    Past Surgical History:  Procedure Laterality Date  . ADENOIDECTOMY    . SHOULDER SURGERY     right  . TONSILLECTOMY    . WRIST SURGERY      reports that she quit smoking about 69 years ago. She has never used smokeless tobacco. She reports that she drinks alcohol. She reports that she does not use drugs. Social History   Social History  . Marital status: Divorced    Spouse name: N/A  . Number of children: N/A  . Years of education: N/A   Occupational History  . retired Psychologist, forensic     Social History Main Topics  . Smoking status: Former Smoker    Quit date: 01/01/1947  . Smokeless tobacco: Never Used  . Alcohol use Yes     Comment: occassional wine  . Drug use: No  . Sexual activity: No   Other Topics Concern  . Not on file   Social History Narrative   Admitted to [facility] 02/19/16   Divorced   Former Smoker-stopped 1948   Alcohol -occasional wine    Functional Status Survey:    History reviewed. No pertinent family history.  Health Maintenance  Topic Date Due  . TETANUS/TDAP  07/09/1952  . DEXA SCAN  07/10/1998  . PNA vac Low Risk Adult (1 of 2 - PCV13) 07/10/1998  . INFLUENZA VACCINE  08/06/2015    Allergies  Allergen Reactions  . Ether Other (See Comments)    Hard to wake up after procedure  . Amitriptyline Rash  . Penicillins Rash    Has patient had a PCN reaction causing immediate rash, facial/tongue/throat swelling, SOB or lightheadedness with hypotension: No Has patient had a PCN reaction causing severe rash involving mucus membranes or skin necrosis:  No Has patient had a PCN reaction that required hospitalization No Has patient had a PCN reaction occurring within the last 10 years: No If all of the above answers are "NO", then may proceed with Cephalosporin use.    Allergies as of 03/02/2016      Reactions   Ether Other (See Comments)   Hard to wake up after procedure   Amitriptyline Rash   Penicillins Rash   Has patient had a PCN reaction causing immediate rash, facial/tongue/throat swelling, SOB or lightheadedness with hypotension: No Has patient had a PCN reaction causing severe  rash involving mucus membranes or skin necrosis: No Has patient had a PCN reaction that required hospitalization No Has patient had a PCN reaction occurring within the last 10 years: No If all of the above answers are "NO", then may proceed with Cephalosporin use.      Medication List       Accurate as of 03/02/16  4:08 PM. Always use your most recent med list.          acetaminophen 325 MG tablet Commonly known as:  TYLENOL Take 2 tablets (650 mg total) by mouth every 8 (eight) hours.   ciprofloxacin 500 MG tablet Commonly known as:  CIPRO Take 1 tablet (500 mg total) by mouth 2 (two) times daily.   haloperidol lactate 5 MG/ML injection Commonly known as:  HALDOL Inject 5 mg into the muscle every 6 (six) hours as needed.   HYDROcodone-acetaminophen 5-325 MG tablet Commonly known as:  NORCO/VICODIN Take 1 tablet by mouth every 6 (six) hours as needed for moderate pain.   NON FORMULARY Magic cup and mighty shakes with all meals   saccharomyces boulardii 250 MG capsule Commonly known as:  FLORASTOR Take 1 capsule (250 mg total) by mouth 2 (two) times daily.   senna 8.6 MG Tabs tablet Commonly known as:  SENOKOT Take 1 tablet (8.6 mg total) by mouth daily.   UNABLE TO FIND Med Name: Med pass 60 mL by mouth 3 times daily       Review of Systems  Constitutional: Negative for activity change, appetite change, fatigue and unexpected weight change.       Frail  HENT: Negative for congestion and hearing loss.   Eyes: Negative.   Respiratory: Negative for cough and shortness of breath.   Cardiovascular: Positive for leg swelling. Negative for chest pain and palpitations.  Gastrointestinal: Negative for abdominal pain, constipation and diarrhea.  Genitourinary: Negative for difficulty urinating and dysuria.  Musculoskeletal: Positive for arthralgias (hip and tail bone pain) and gait problem. Negative for myalgias.  Skin: Negative for color change and wound.    Neurological: Negative for dizziness and weakness.       Dementia with behavioral disturbance.  Psychiatric/Behavioral: Positive for behavioral problems, confusion and decreased concentration. Negative for agitation.    There were no vitals filed for this visit. There is no height or weight on file to calculate BMI. Physical Exam  Constitutional: She appears well-developed.   pleasantly confused thin elderly  HENT:  Head: Normocephalic.  Mouth/Throat: Oropharynx is clear and moist. No oropharyngeal exudate.  Eyes: Conjunctivae and EOM are normal. Pupils are equal, round, and reactive to light. Right eye exhibits no discharge. Left eye exhibits no discharge. No scleral icterus.  Neck: Neck supple. No JVD present. No thyromegaly present.  Cardiovascular: Normal heart sounds and intact distal pulses.  Exam reveals no gallop and no friction rub.   No murmur heard. AF with RVR. Rate  110.  Pulmonary/Chest: Effort normal and breath sounds normal. No respiratory distress. She has no wheezes. She has no rales.  Abdominal: Soft. Bowel sounds are normal. She exhibits no distension. There is no tenderness. There is no rebound and no guarding.  Genitourinary:  Genitourinary Comments: Incontinent   Musculoskeletal: She exhibits edema. She exhibits no tenderness or deformity.  Moves x 4 extremities.unsteady gait due to right hip pain. Poor balance on standing.  Lymphadenopathy:    She has no cervical adenopathy.  Neurological: She is alert. No cranial nerve deficit.  Demented. Mild tremor of both hands.  Skin: Skin is warm and dry. No rash noted. No erythema.  Psychiatric: She has a normal mood and affect.    Labs reviewed: Basic Metabolic Panel:  Recent Labs  16/10/9600/13/18 0656 02/19/16 0449 02/19/16 2323  NA 139 140 138  K 3.9 4.0 3.7  CL 107 108 102  CO2 25 27 24   GLUCOSE 106* 107* 109*  BUN 23* 23* 16  CREATININE 0.89 0.74 0.78  CALCIUM 8.5* 9.1 8.8*   Liver Function  Tests:  Recent Labs  02/19/16 2323  AST 25  ALT 16  ALKPHOS 68  BILITOT 1.5*  PROT 5.4*  ALBUMIN 3.2*   No results for input(s): LIPASE, AMYLASE in the last 8760 hours. No results for input(s): AMMONIA in the last 8760 hours. CBC:  Recent Labs  01/01/16 1300 02/17/16 0510 02/19/16 2323  WBC 5.8 9.6 6.7  NEUTROABS  --  8.5*  --   HGB 15.0 11.7* 10.7*  HCT 44.8 34.8* 32.7*  MCV 91.1 90.2 90.8  PLT 158 102* 115*   Cardiac Enzymes: No results for input(s): CKTOTAL, CKMB, CKMBINDEX, TROPONINI in the last 8760 hours. BNP: Invalid input(s): POCBNP No results found for: HGBA1C Lab Results  Component Value Date   TSH 0.235 (L) 11/20/2014    Assessment/Plan 1. Other closed nondisplaced fracture of proximal end of right humerus with routine healing, subsequent encounter Continue PT at home  2. Closed stable fracture of multiple pubic rami (HCC) Continue PT at home  3. AKI (acute kidney injury) (HCC) improved  4. Dementia with behavioral disturbance, unspecified dementia type unchanged  5. Essential hypertension controlled  6. Tremor mild  7. Chronic atrial fibrillation (HCC) I presume the AF has been present since 2016. It could be acute. Needs follow up TSH. See low levels from 2016.  8. Anemia, unspecified type [facility] Follow up lab shouold be scheduled through her PCP.  9. Possible hyperthyroidism Note the low TSH of 0.235 in Nov 2016. I do not know if this was ever repeated as outpatient. Follow up with Dr Grayland JackHussian, previously listed PCP.

## 2016-03-02 NOTE — Patient Instructions (Signed)
Opened in error

## 2016-03-03 DIAGNOSIS — R41841 Cognitive communication deficit: Secondary | ICD-10-CM | POA: Diagnosis not present

## 2016-03-03 DIAGNOSIS — N189 Chronic kidney disease, unspecified: Secondary | ICD-10-CM | POA: Diagnosis not present

## 2016-03-03 DIAGNOSIS — Z9181 History of falling: Secondary | ICD-10-CM | POA: Diagnosis not present

## 2016-03-03 DIAGNOSIS — M6281 Muscle weakness (generalized): Secondary | ICD-10-CM | POA: Diagnosis not present

## 2016-03-03 DIAGNOSIS — R1312 Dysphagia, oropharyngeal phase: Secondary | ICD-10-CM | POA: Diagnosis not present

## 2016-03-03 LAB — CBC AND DIFFERENTIAL
HCT: 41 % (ref 36–46)
Hemoglobin: 13.3 g/dL (ref 12.0–16.0)
PLATELETS: 256 10*3/uL (ref 150–399)
WBC: 8.4 10^3/mL

## 2016-03-03 LAB — BASIC METABOLIC PANEL
BUN: 34 mg/dL — AB (ref 4–21)
CREATININE: 0.8 mg/dL (ref 0.5–1.1)
Glucose: 112 mg/dL
Potassium: 4.3 mmol/L (ref 3.4–5.3)
Sodium: 142 mmol/L (ref 137–147)

## 2016-03-04 DIAGNOSIS — M6281 Muscle weakness (generalized): Secondary | ICD-10-CM | POA: Diagnosis not present

## 2016-03-04 DIAGNOSIS — R41841 Cognitive communication deficit: Secondary | ICD-10-CM | POA: Diagnosis not present

## 2016-03-04 DIAGNOSIS — Z9181 History of falling: Secondary | ICD-10-CM | POA: Diagnosis not present

## 2016-03-04 DIAGNOSIS — R1312 Dysphagia, oropharyngeal phase: Secondary | ICD-10-CM | POA: Diagnosis not present

## 2016-03-05 DIAGNOSIS — Z9181 History of falling: Secondary | ICD-10-CM | POA: Diagnosis not present

## 2016-03-05 DIAGNOSIS — R1312 Dysphagia, oropharyngeal phase: Secondary | ICD-10-CM | POA: Diagnosis not present

## 2016-03-05 DIAGNOSIS — M6281 Muscle weakness (generalized): Secondary | ICD-10-CM | POA: Diagnosis not present

## 2016-03-05 DIAGNOSIS — R41841 Cognitive communication deficit: Secondary | ICD-10-CM | POA: Diagnosis not present

## 2016-03-06 DIAGNOSIS — R41841 Cognitive communication deficit: Secondary | ICD-10-CM | POA: Diagnosis not present

## 2016-03-06 DIAGNOSIS — R1312 Dysphagia, oropharyngeal phase: Secondary | ICD-10-CM | POA: Diagnosis not present

## 2016-03-06 DIAGNOSIS — M6281 Muscle weakness (generalized): Secondary | ICD-10-CM | POA: Diagnosis not present

## 2016-03-06 DIAGNOSIS — M79609 Pain in unspecified limb: Secondary | ICD-10-CM | POA: Diagnosis not present

## 2016-03-06 DIAGNOSIS — Z9181 History of falling: Secondary | ICD-10-CM | POA: Diagnosis not present

## 2016-03-06 DIAGNOSIS — Z79899 Other long term (current) drug therapy: Secondary | ICD-10-CM | POA: Diagnosis not present

## 2016-03-06 LAB — CBC AND DIFFERENTIAL
HCT: 36 % (ref 36–46)
HEMOGLOBIN: 11.2 g/dL — AB (ref 12.0–16.0)
Platelets: 202 10*3/uL (ref 150–399)
WBC: 5.6 10^3/mL

## 2016-03-06 LAB — BASIC METABOLIC PANEL
BUN: 30 mg/dL — AB (ref 4–21)
Creatinine: 0.7 mg/dL (ref 0.5–1.1)
GLUCOSE: 92 mg/dL
POTASSIUM: 4.7 mmol/L (ref 3.4–5.3)
SODIUM: 144 mmol/L (ref 137–147)

## 2016-03-09 DIAGNOSIS — Z9181 History of falling: Secondary | ICD-10-CM | POA: Diagnosis not present

## 2016-03-09 DIAGNOSIS — M6281 Muscle weakness (generalized): Secondary | ICD-10-CM | POA: Diagnosis not present

## 2016-03-09 DIAGNOSIS — R1312 Dysphagia, oropharyngeal phase: Secondary | ICD-10-CM | POA: Diagnosis not present

## 2016-03-09 DIAGNOSIS — R41841 Cognitive communication deficit: Secondary | ICD-10-CM | POA: Diagnosis not present

## 2016-03-10 DIAGNOSIS — Z9181 History of falling: Secondary | ICD-10-CM | POA: Diagnosis not present

## 2016-03-10 DIAGNOSIS — R1312 Dysphagia, oropharyngeal phase: Secondary | ICD-10-CM | POA: Diagnosis not present

## 2016-03-10 DIAGNOSIS — R41841 Cognitive communication deficit: Secondary | ICD-10-CM | POA: Diagnosis not present

## 2016-03-10 DIAGNOSIS — M6281 Muscle weakness (generalized): Secondary | ICD-10-CM | POA: Diagnosis not present

## 2016-03-11 DIAGNOSIS — M6281 Muscle weakness (generalized): Secondary | ICD-10-CM | POA: Diagnosis not present

## 2016-03-11 DIAGNOSIS — R1312 Dysphagia, oropharyngeal phase: Secondary | ICD-10-CM | POA: Diagnosis not present

## 2016-03-11 DIAGNOSIS — Z9181 History of falling: Secondary | ICD-10-CM | POA: Diagnosis not present

## 2016-03-11 DIAGNOSIS — R41841 Cognitive communication deficit: Secondary | ICD-10-CM | POA: Diagnosis not present

## 2016-03-12 DIAGNOSIS — R1312 Dysphagia, oropharyngeal phase: Secondary | ICD-10-CM | POA: Diagnosis not present

## 2016-03-12 DIAGNOSIS — Z9181 History of falling: Secondary | ICD-10-CM | POA: Diagnosis not present

## 2016-03-12 DIAGNOSIS — M6281 Muscle weakness (generalized): Secondary | ICD-10-CM | POA: Diagnosis not present

## 2016-03-12 DIAGNOSIS — R41841 Cognitive communication deficit: Secondary | ICD-10-CM | POA: Diagnosis not present

## 2016-03-13 DIAGNOSIS — Z9181 History of falling: Secondary | ICD-10-CM | POA: Diagnosis not present

## 2016-03-13 DIAGNOSIS — M6281 Muscle weakness (generalized): Secondary | ICD-10-CM | POA: Diagnosis not present

## 2016-03-13 DIAGNOSIS — F0391 Unspecified dementia with behavioral disturbance: Secondary | ICD-10-CM | POA: Diagnosis not present

## 2016-03-13 DIAGNOSIS — R1312 Dysphagia, oropharyngeal phase: Secondary | ICD-10-CM | POA: Diagnosis not present

## 2016-03-13 DIAGNOSIS — R41841 Cognitive communication deficit: Secondary | ICD-10-CM | POA: Diagnosis not present

## 2016-03-16 DIAGNOSIS — Z9181 History of falling: Secondary | ICD-10-CM | POA: Diagnosis not present

## 2016-03-16 DIAGNOSIS — M6281 Muscle weakness (generalized): Secondary | ICD-10-CM | POA: Diagnosis not present

## 2016-03-16 DIAGNOSIS — R1312 Dysphagia, oropharyngeal phase: Secondary | ICD-10-CM | POA: Diagnosis not present

## 2016-03-16 DIAGNOSIS — R41841 Cognitive communication deficit: Secondary | ICD-10-CM | POA: Diagnosis not present

## 2016-03-17 DIAGNOSIS — Z9181 History of falling: Secondary | ICD-10-CM | POA: Diagnosis not present

## 2016-03-17 DIAGNOSIS — M6281 Muscle weakness (generalized): Secondary | ICD-10-CM | POA: Diagnosis not present

## 2016-03-17 DIAGNOSIS — R41841 Cognitive communication deficit: Secondary | ICD-10-CM | POA: Diagnosis not present

## 2016-03-17 DIAGNOSIS — R1312 Dysphagia, oropharyngeal phase: Secondary | ICD-10-CM | POA: Diagnosis not present

## 2016-03-18 DIAGNOSIS — R1312 Dysphagia, oropharyngeal phase: Secondary | ICD-10-CM | POA: Diagnosis not present

## 2016-03-18 DIAGNOSIS — M6281 Muscle weakness (generalized): Secondary | ICD-10-CM | POA: Diagnosis not present

## 2016-03-18 DIAGNOSIS — Z9181 History of falling: Secondary | ICD-10-CM | POA: Diagnosis not present

## 2016-03-18 DIAGNOSIS — R41841 Cognitive communication deficit: Secondary | ICD-10-CM | POA: Diagnosis not present

## 2016-03-19 DIAGNOSIS — R1312 Dysphagia, oropharyngeal phase: Secondary | ICD-10-CM | POA: Diagnosis not present

## 2016-03-19 DIAGNOSIS — M6281 Muscle weakness (generalized): Secondary | ICD-10-CM | POA: Diagnosis not present

## 2016-03-19 DIAGNOSIS — Z9181 History of falling: Secondary | ICD-10-CM | POA: Diagnosis not present

## 2016-03-19 DIAGNOSIS — R41841 Cognitive communication deficit: Secondary | ICD-10-CM | POA: Diagnosis not present

## 2016-03-20 DIAGNOSIS — R1312 Dysphagia, oropharyngeal phase: Secondary | ICD-10-CM | POA: Diagnosis not present

## 2016-03-20 DIAGNOSIS — R41841 Cognitive communication deficit: Secondary | ICD-10-CM | POA: Diagnosis not present

## 2016-03-20 DIAGNOSIS — Z9181 History of falling: Secondary | ICD-10-CM | POA: Diagnosis not present

## 2016-03-20 DIAGNOSIS — M6281 Muscle weakness (generalized): Secondary | ICD-10-CM | POA: Diagnosis not present

## 2016-03-23 DIAGNOSIS — Z9181 History of falling: Secondary | ICD-10-CM | POA: Diagnosis not present

## 2016-03-23 DIAGNOSIS — R41841 Cognitive communication deficit: Secondary | ICD-10-CM | POA: Diagnosis not present

## 2016-03-23 DIAGNOSIS — M6281 Muscle weakness (generalized): Secondary | ICD-10-CM | POA: Diagnosis not present

## 2016-03-23 DIAGNOSIS — R1312 Dysphagia, oropharyngeal phase: Secondary | ICD-10-CM | POA: Diagnosis not present

## 2016-03-24 DIAGNOSIS — R1312 Dysphagia, oropharyngeal phase: Secondary | ICD-10-CM | POA: Diagnosis not present

## 2016-03-24 DIAGNOSIS — R41841 Cognitive communication deficit: Secondary | ICD-10-CM | POA: Diagnosis not present

## 2016-03-24 DIAGNOSIS — M6281 Muscle weakness (generalized): Secondary | ICD-10-CM | POA: Diagnosis not present

## 2016-03-24 DIAGNOSIS — Z9181 History of falling: Secondary | ICD-10-CM | POA: Diagnosis not present

## 2016-03-25 ENCOUNTER — Encounter: Payer: Self-pay | Admitting: Family

## 2016-03-25 ENCOUNTER — Non-Acute Institutional Stay (SKILLED_NURSING_FACILITY): Payer: Medicare Other | Admitting: Family

## 2016-03-25 DIAGNOSIS — M79609 Pain in unspecified limb: Secondary | ICD-10-CM | POA: Diagnosis not present

## 2016-03-25 DIAGNOSIS — K5901 Slow transit constipation: Secondary | ICD-10-CM

## 2016-03-25 DIAGNOSIS — F0391 Unspecified dementia with behavioral disturbance: Secondary | ICD-10-CM

## 2016-03-25 DIAGNOSIS — M6281 Muscle weakness (generalized): Secondary | ICD-10-CM | POA: Diagnosis not present

## 2016-03-25 DIAGNOSIS — Z9181 History of falling: Secondary | ICD-10-CM | POA: Diagnosis not present

## 2016-03-25 DIAGNOSIS — R1312 Dysphagia, oropharyngeal phase: Secondary | ICD-10-CM | POA: Diagnosis not present

## 2016-03-25 DIAGNOSIS — R609 Edema, unspecified: Secondary | ICD-10-CM

## 2016-03-25 DIAGNOSIS — R41841 Cognitive communication deficit: Secondary | ICD-10-CM | POA: Diagnosis not present

## 2016-03-25 MED ORDER — FUROSEMIDE 40 MG PO TABS: 40.0000 mg | ORAL_TABLET | Freq: Every day | ORAL | 3 refills | Status: DC

## 2016-03-25 NOTE — Progress Notes (Signed)
Location:  [facility] Room Number: [phone] Place of Service:  SNF (31) Provider: Marquesa Rath FNP-C   Georgann Housekeeper, MD  Patient Care Team: Georgann Housekeeper, MD as PCP - General (Internal Medicine)  Extended Emergency Contact Information Primary Emergency Contact: Rae Mar [place] Home Phone: [phone] Mobile Phone: [phone] Relation: Son Secondary Emergency Contact: Magdalen Spatz Address: [address]          [place], [place] [address] [place] Mobile Phone: [phone] Relation: Son  Code Status:  Full Code  Goals of care: Advanced Directive information Advanced Directives 03/02/2016  Does Patient Have a Medical Advance Directive? No  Type of Advance Directive -  Would patient like information on creating a medical advance directive? -     Chief Complaint  Patient presents with  . Medical Management of Chronic Issues    Routine Visit   . Health Maintenance    Due for pneu13    HPI:  Pt is a 83 y.o. female seen today [facility] for medical management of chronic diseases.She has a significant medical history of HTN, AFib, Dementia with behavioral disturbances, thrombocytopenia, Anemia among other conditions. She is seen in her room today. She is confused at baseline unable to provide HPI and ROS.Facility Nurse reports patient's lower extremities worsening. No shortness of breath noted.No recent fall episodes, weight changes or acute illnesses since prior visit.      Past Medical History:  Diagnosis Date  . AKI (acute kidney injury) (HCC)   . Bilateral pubic rami fractures (HCC) 06/29/2014  . Closed fracture of right proximal humerus   . Closed stable fracture of multiple pubic rami (HCC) 02/17/2016  . Dementia   . Dementia with behavioral disturbance   . Essential hypertension   . Fracture of left pubis (HCC)   . Ischium fracture (HCC)   . Pelvic fracture (HCC) 06/30/2014    . Pressure ulcer 06/30/2014  . Thrombocytopenia (HCC) 06/30/2014  . Tremor 06/30/2014   Past Surgical History:  Procedure Laterality Date  . ADENOIDECTOMY    . SHOULDER SURGERY     right  . TONSILLECTOMY    . WRIST SURGERY      Allergies  Allergen Reactions  . Ether Other (See Comments)    Hard to wake up after procedure  . Amitriptyline Rash  . Penicillins Rash    Has patient had a PCN reaction causing immediate rash, facial/tongue/throat swelling, SOB or lightheadedness with hypotension: No Has patient had a PCN reaction causing severe rash involving mucus membranes or skin necrosis: No Has patient had a PCN reaction that required hospitalization No Has patient had a PCN reaction occurring within the last 10 years: No If all of the above answers are "NO", then may proceed with Cephalosporin use.    Allergies as of 03/25/2016      Reactions   Ether Other (See Comments)   Hard to wake up after procedure   Amitriptyline Rash   Penicillins Rash   Has patient had a PCN reaction causing immediate rash, facial/tongue/throat swelling, SOB or lightheadedness with hypotension: No Has patient had a PCN reaction causing severe rash involving mucus membranes or skin necrosis: No Has patient had a PCN reaction that required hospitalization No Has patient had a PCN reaction occurring within the last 10 years: No If all of the above answers are "NO", then may proceed with Cephalosporin use.      Medication List  Accurate as of 03/25/16  5:58 PM. Always use your most recent med list.          acetaminophen 325 MG tablet Commonly known as:  TYLENOL Take 650 mg by mouth 2 (two) times daily as needed (for temp greater than 99.5 or every 8 hours as needed for pain).   haloperidol lactate 5 MG/ML injection Commonly known as:  HALDOL Inject 5 mg into the muscle every 6 (six) hours as needed.   HYDROcodone-acetaminophen 5-325 MG tablet Commonly known as:  NORCO/VICODIN Take 1  tablet by mouth every 6 (six) hours as needed for moderate pain.   NON FORMULARY Magic cup and mighty shakes with all meals   senna 8.6 MG Tabs tablet Commonly known as:  SENOKOT Take 1 tablet (8.6 mg total) by mouth daily.   UNABLE TO FIND Med Name: Med pass 60 mL by mouth 3 times daily       Review of Systems  Unable to perform ROS: Dementia    Immunization History  Administered Date(s) Administered  . PPD Test 07/02/2014, 02/24/2016   Pertinent  Health Maintenance Due  Topic Date Due  . PNA vac Low Risk Adult (1 of 2 - PCV13) 07/10/1998  . INFLUENZA VACCINE  01/05/2017 (Originally 08/06/2015)  . DEXA SCAN  01/06/2023 (Originally 07/10/1998)    Vitals:   03/25/16 1034  BP: (!) 156/87  Pulse: 91  Resp: 20  Temp: 98.8 F (37.1 C)  TempSrc: Oral  SpO2: 96%  Weight: 103 lb (46.7 kg)  Height: 5\' 2"  (1.575 m)   Body mass index is 18.84 kg/m. Physical Exam  Constitutional: She appears well-developed.   pleasantly confused at baseline.   HENT:  Head: Normocephalic.  Mouth/Throat: Oropharynx is clear and moist. No oropharyngeal exudate.  Eyes: Conjunctivae and EOM are normal. Pupils are equal, round, and reactive to light. Right eye exhibits no discharge. Left eye exhibits no discharge. No scleral icterus.  Neck: Neck supple. No JVD present. No thyromegaly present.  Cardiovascular: Normal rate, regular rhythm, normal heart sounds and intact distal pulses.  Exam reveals no gallop and no friction rub.   No murmur heard. Pulmonary/Chest: Effort normal and breath sounds normal. No respiratory distress. She has no wheezes. She has no rales.  Abdominal: Soft. Bowel sounds are normal. She exhibits no distension. There is no tenderness. There is no rebound and no guarding.  Genitourinary:  Genitourinary Comments: Incontinent   Musculoskeletal: She exhibits edema. She exhibits no tenderness or deformity.  Moves x 4 extremities.Unsteady gait. Bilateral lower extremities 3+  edema. Ted hose off during visit.   Lymphadenopathy:    She has no cervical adenopathy.  Neurological: She is alert.  pleasantly confused at her baseline  Skin: Skin is warm and dry. No rash noted. No erythema.  Psychiatric: She has a normal mood and affect.    Labs reviewed:  Recent Labs  02/18/16 0656 02/19/16 0449 02/19/16 2323 03/03/16 1254 03/06/16 1318  NA 139 140 138 142 144  K 3.9 4.0 3.7 4.3 4.7  CL 107 108 102  --   --   CO2 25 27 24   --   --   GLUCOSE 106* 107* 109*  --   --   BUN 23* 23* 16 34* 30*  CREATININE 0.89 0.74 0.78 0.8 0.7  CALCIUM 8.5* 9.1 8.8*  --   --     Recent Labs  02/19/16 2323  AST 25  ALT 16  ALKPHOS 68  BILITOT 1.5*  PROT  5.4*  ALBUMIN 3.2*    Recent Labs  01/01/16 1300 02/17/16 0510 02/19/16 2323 03/03/16 1254 03/06/16 1318  WBC 5.8 9.6 6.7 8.4 5.6  NEUTROABS  --  8.5*  --   --   --   HGB 15.0 11.7* 10.7* 13.3 11.2*  HCT 44.8 34.8* 32.7* 41 36  MCV 91.1 90.2 90.8  --   --   PLT 158 102* 115* 256 202   Lab Results  Component Value Date   TSH 0.235 (L) 11/20/2014   Assessment/Plan 1. Edema bilat. Lower extremities 3+. Continue with Ted hose on in AM and off at bedtime. Start Furosemide 40 mg Tablet daily X 5 days then Furosemide 20 mg Tablet Daily.Recheck BMP  03/30/2016.   2. Slow transit constipation Current regimen effective. Continue to encourage oral intake.   3. Dementia with behavioral disturbance, unspecified dementia type No new behavioral issues. Continue to assist with ADL's. Continue skin care. Fall and safety precautions.   Family/ staff Communication: Reviewed plan of care with patient and facility Nurse supervisor   Labs/tests ordered:  BMP  03/30/2016.   Caesar Bookman, NP

## 2016-03-26 DIAGNOSIS — R41841 Cognitive communication deficit: Secondary | ICD-10-CM | POA: Diagnosis not present

## 2016-03-26 DIAGNOSIS — M6281 Muscle weakness (generalized): Secondary | ICD-10-CM | POA: Diagnosis not present

## 2016-03-26 DIAGNOSIS — R1312 Dysphagia, oropharyngeal phase: Secondary | ICD-10-CM | POA: Diagnosis not present

## 2016-03-26 DIAGNOSIS — Z9181 History of falling: Secondary | ICD-10-CM | POA: Diagnosis not present

## 2016-03-27 DIAGNOSIS — Z9181 History of falling: Secondary | ICD-10-CM | POA: Diagnosis not present

## 2016-03-27 DIAGNOSIS — R41841 Cognitive communication deficit: Secondary | ICD-10-CM | POA: Diagnosis not present

## 2016-03-27 DIAGNOSIS — R300 Dysuria: Secondary | ICD-10-CM | POA: Diagnosis not present

## 2016-03-27 DIAGNOSIS — R1312 Dysphagia, oropharyngeal phase: Secondary | ICD-10-CM | POA: Diagnosis not present

## 2016-03-27 DIAGNOSIS — M6281 Muscle weakness (generalized): Secondary | ICD-10-CM | POA: Diagnosis not present

## 2016-03-28 DIAGNOSIS — R1312 Dysphagia, oropharyngeal phase: Secondary | ICD-10-CM | POA: Diagnosis not present

## 2016-03-28 DIAGNOSIS — R41841 Cognitive communication deficit: Secondary | ICD-10-CM | POA: Diagnosis not present

## 2016-03-28 DIAGNOSIS — Z9181 History of falling: Secondary | ICD-10-CM | POA: Diagnosis not present

## 2016-03-28 DIAGNOSIS — M6281 Muscle weakness (generalized): Secondary | ICD-10-CM | POA: Diagnosis not present

## 2016-03-30 ENCOUNTER — Encounter: Payer: Self-pay | Admitting: Family

## 2016-03-30 ENCOUNTER — Non-Acute Institutional Stay (SKILLED_NURSING_FACILITY): Payer: Medicare Other | Admitting: Family

## 2016-03-30 DIAGNOSIS — Z79899 Other long term (current) drug therapy: Secondary | ICD-10-CM | POA: Diagnosis not present

## 2016-03-30 DIAGNOSIS — R600 Localized edema: Secondary | ICD-10-CM

## 2016-03-30 DIAGNOSIS — S32599A Other specified fracture of unspecified pubis, initial encounter for closed fracture: Secondary | ICD-10-CM

## 2016-03-30 DIAGNOSIS — R41841 Cognitive communication deficit: Secondary | ICD-10-CM | POA: Diagnosis not present

## 2016-03-30 DIAGNOSIS — Z9181 History of falling: Secondary | ICD-10-CM | POA: Diagnosis not present

## 2016-03-30 DIAGNOSIS — R2681 Unsteadiness on feet: Secondary | ICD-10-CM | POA: Diagnosis not present

## 2016-03-30 DIAGNOSIS — M6281 Muscle weakness (generalized): Secondary | ICD-10-CM | POA: Diagnosis not present

## 2016-03-30 DIAGNOSIS — R1312 Dysphagia, oropharyngeal phase: Secondary | ICD-10-CM | POA: Diagnosis not present

## 2016-03-30 LAB — BASIC METABOLIC PANEL
BUN: 25 mg/dL — AB (ref 4–21)
CREATININE: 0.7 mg/dL (ref 0.5–1.1)
GLUCOSE: 91 mg/dL
Potassium: 4.5 mmol/L (ref 3.4–5.3)
Sodium: 148 mmol/L — AB (ref 137–147)

## 2016-03-30 NOTE — Progress Notes (Signed)
Location:  [facility] Room Number: [phone] Place of Service:  SNF (31)  Provider: Richarda Blade FNP-C   PCP: Georgann Housekeeper, MD Patient Care Team: Georgann Housekeeper, MD as PCP - General (Internal Medicine)  Extended Emergency Contact Information Primary Emergency Contact: Rae [place] Home Phone: [phone] Mobile Phone: [phone] Relation: Son Secondary Emergency Contact: Magdalen Spatz Address: [address]          [place], [place] [address] [place] Mobile Phone: [phone] Relation: Son  Code Status: Full code  Goals of care:  Advanced Directive information Advanced Directives 03/30/2016  Does Patient Have a Medical Advance Directive? No  Type of Advance Directive -  Would patient like information on creating a medical advance directive? No - Patient declined     Allergies  Allergen Reactions  . Ether Other (See Comments)    Hard to wake up after procedure  . Amitriptyline Rash  . Penicillins Rash    Has patient had a PCN reaction causing immediate rash, facial/tongue/throat swelling, SOB or lightheadedness with hypotension: No Has patient had a PCN reaction causing severe rash involving mucus membranes or skin necrosis: No Has patient had a PCN reaction that required hospitalization No Has patient had a PCN reaction occurring within the last 10 years: No If all of the above answers are "NO", then may proceed with Cephalosporin use.    Chief Complaint  Patient presents with  . Discharge Note    HPI:  83 y.o. female seen today at [facility] for discharge home. She was here for short term rehabilitation for post hospital admission from 02/17/2016-02/19/2016 for evaluation post mechanical fall at home with injury to pelvis and right shoulder. X-ray showed pelvic fracture right pubis and right single humerus fracture.Orthopaedic was consulted at the hospital and  conservative treatment was recommended.She also had mild delirium treated with Ativan but was discontinued due to increased sedation.On discharged it was recommended to avoid Benzo's if possible.she has a medical history of Dementia,HTN, Afib  among other conditions. She is seen in her room today.During her stay here in rehab she was treated for UTI: Urine cultures showed > 100, 000 colonies E.coli ESBL negative. she has worked with PT/OT now stable for discharge home.She will be discharged home with Home health PT/OT to continue with ROM, Exercise, Gait stability and muscle strengthening. She does not require  any DME has own FWW at home. Home health services will be arranged by facility social worker prior to discharge. Prescription medication will be written x 1 month then patient to follow up with PCP in 1-2 weeks. Facility staff report no new concerns.    Past Medical History:  Diagnosis Date  . AKI (acute kidney injury) (HCC)   . Bilateral pubic rami fractures (HCC) 06/29/2014  . Closed fracture of right proximal humerus   . Closed stable fracture of multiple pubic rami (HCC) 02/17/2016  . Dementia   . Dementia with behavioral disturbance   . Essential hypertension   . Fracture of left pubis (HCC)   . Ischium fracture (HCC)   . Pelvic fracture (HCC) 06/30/2014  . Pressure ulcer 06/30/2014  . Thrombocytopenia (HCC) 06/30/2014  . Tremor 06/30/2014    Past Surgical History:  Procedure Laterality Date  . ADENOIDECTOMY    . SHOULDER SURGERY     right  . TONSILLECTOMY    . WRIST SURGERY        reports that she quit  smoking about 69 years ago. She has never used smokeless tobacco. She reports that she drinks alcohol. She reports that she does not use drugs. Social History   Social History  . Marital status: Divorced    Spouse name: N/A  . Number of children: N/A  . Years of education: N/A   Occupational History  . retired Psychologist, forensiclegal secretary     Social History Main Topics  . Smoking  status: Former Smoker    Quit date: 01/01/1947  . Smokeless tobacco: Never Used  . Alcohol use Yes     Comment: occassional wine  . Drug use: No  . Sexual activity: No   Other Topics Concern  . Not on file   Social History Narrative   [facility]   Divorced   Former Smoker-stopped 1948   Alcohol -occasional wine    Allergies  Allergen Reactions  . Ether Other (See Comments)    Hard to wake up after procedure  . Amitriptyline Rash  . Penicillins Rash    Has patient had a PCN reaction causing immediate rash, facial/tongue/throat swelling, SOB or lightheadedness with hypotension: No Has patient had a PCN reaction causing severe rash involving mucus membranes or skin necrosis: No Has patient had a PCN reaction that required hospitalization No Has patient had a PCN reaction occurring within the last 10 years: No If all of the above answers are "NO", then may proceed with Cephalosporin use.    Pertinent  Health Maintenance Due  Topic Date Due  . PNA vac Low Risk Adult (1 of 2 - PCV13) 07/10/1998  . INFLUENZA VACCINE  01/05/2017 (Originally 08/06/2015)  . DEXA SCAN  01/06/2023 (Originally 07/10/1998)    Medications: Allergies as of 03/30/2016      Reactions   Ether Other (See Comments)   Hard to wake up after procedure   Amitriptyline Rash   Penicillins Rash   Has patient had a PCN reaction causing immediate rash, facial/tongue/throat swelling, SOB or lightheadedness with hypotension: No Has patient had a PCN reaction causing severe rash involving mucus membranes or skin necrosis: No Has patient had a PCN reaction that required hospitalization No Has patient had a PCN reaction occurring within the last 10 years: No If all of the above answers are "NO", then may proceed with Cephalosporin use.      Medication List       Accurate as of 03/30/16  6:00 PM. Always use your most recent med list.          acetaminophen 325 MG tablet Commonly known as:   TYLENOL Take 650 mg by mouth 2 (two) times daily as needed (for temp greater than 99.5 or every 8 hours as needed for pain).   furosemide 20 MG tablet Commonly known as:  LASIX Take 20 mg by mouth daily.   haloperidol lactate 5 MG/ML injection Commonly known as:  HALDOL Inject 5 mg into the muscle every 6 (six) hours as needed.   HYDROcodone-acetaminophen 5-325 MG tablet Commonly known as:  NORCO/VICODIN Take 1 tablet by mouth every 6 (six) hours as needed for moderate pain.   senna 8.6 MG Tabs tablet Commonly known as:  SENOKOT Take 1 tablet (8.6 mg total) by mouth daily.   UNABLE TO FIND Med Name: Med pass 60 mL by mouth 3 times daily       Review of Systems  Unable to perform ROS: Dementia    Vitals:   03/30/16 1403  BP: 129/71  Pulse: 98  Resp: 16  Temp: 98.5 F (36.9 C)  TempSrc: Oral  SpO2: 98%  Weight: 105 lb 12.8 oz (48 kg)  Height: 5\' 2"  (1.575 m)   Body mass index is 19.35 kg/m. Physical Exam  Constitutional: She appears well-developed.   pleasantly confused at baseline.   HENT:  Head: Normocephalic.  Mouth/Throat: Oropharynx is clear and moist. No oropharyngeal exudate.  Eyes: Conjunctivae and EOM are normal. Pupils are equal, round, and reactive to light. Right eye exhibits no discharge. Left eye exhibits no discharge. No scleral icterus.  Neck: Neck supple. No JVD present. No thyromegaly present.  Cardiovascular: Intact distal pulses.  Exam reveals no gallop and no friction rub.   Irregular HR   Pulmonary/Chest: Effort normal and breath sounds normal. No respiratory distress. She has no wheezes. She has no rales.  Abdominal: Soft. Bowel sounds are normal. She exhibits no distension. There is no tenderness. There is no rebound and no guarding.  Genitourinary:  Genitourinary Comments: Incontinent   Musculoskeletal: She exhibits no tenderness or deformity.  Moves x 4 extremities.Unsteady gait. Bilateral lower extremities  Trace -1 + edema. Ted hose  off during visit.   Lymphadenopathy:    She has no cervical adenopathy.  Neurological: She is alert.   confused at her baseline  Skin: Skin is warm and dry. No rash noted. No erythema.  Psychiatric: She has a normal mood and affect.    Labs reviewed: Basic Metabolic Panel:  Recent Labs  78/29/56 0656 02/19/16 0449 02/19/16 2323 03/03/16 1254 03/06/16 1318 03/30/16  NA 139 140 138 142 144 148*  K 3.9 4.0 3.7 4.3 4.7 4.5  CL 107 108 102  --   --   --   CO2 25 27 24   --   --   --   GLUCOSE 106* 107* 109*  --   --   --   BUN 23* 23* 16 34* 30* 25*  CREATININE 0.89 0.74 0.78 0.8 0.7 0.7  CALCIUM 8.5* 9.1 8.8*  --   --   --    Liver Function Tests:  Recent Labs  02/19/16 2323  AST 25  ALT 16  ALKPHOS 68  BILITOT 1.5*  PROT 5.4*  ALBUMIN 3.2*   CBC:  Recent Labs  01/01/16 1300 02/17/16 0510 02/19/16 2323 03/03/16 1254 03/06/16 1318  WBC 5.8 9.6 6.7 8.4 5.6  NEUTROABS  --  8.5*  --   --   --   HGB 15.0 11.7* 10.7* 13.3 11.2*  HCT 44.8 34.8* 32.7* 41 36  MCV 91.1 90.2 90.8  --   --   PLT 158 102* 115* 256 202   Assessment/Plan:   1.Unsteady gait  Has worked well with PT/ OT. Will discharge home PT/OT to continue with ROM, Exercise, Gait stability and muscle strengthening. No DME required has own FWW Fall and safety precautions.   2. Closed stable fracture of multiple pubic rami Status post short term rehabilitation for post hospital admission from 02/17/2016-02/19/2016 for evaluation post mechanical fall at home with injury to pelvis and right shoulder. X-ray showed pelvic fracture right pubis and right single humerus fracture.Orthopaedic was consulted at the hospital and conservative treatment was recommended.right hip pain under control with current regimen. Continue to monitor. Fall and safety precautions. CBC in 1-2 weeks with PCP   3. Localized edema Has improved with diuretics.Trace-1+ to lower extremities. No shortness of breath.continue on furosemide 20 mg  Tablet daily. Continue Ted hose on in AM and off at bedtime. CMP  in 1-2 weeks with PCP   Patient is being discharged with the following home health services:   -PT/OT for ROM, exercise, gait stability and muscle strengthening  Patient is being discharged with the following durable medical equipment:   - No DME required has own FWW  Patient has been advised to f/u with their PCP in 1-2 weeks to for a transitions of care visit.Social services at their facility was responsible for arranging this appointment.Pt was provided with adequate prescriptions of noncontrolled medications to reach the scheduled appointment.For controlled substances, a limited supply was provided as appropriate for the individual patient. If the pt normally receives these medications from a pain clinic or has a contract with another physician, these medications should be received from that clinic or physician only).    Future labs/tests needed:  CBC, BMP in 1-2 weeks PCP

## 2016-03-31 DIAGNOSIS — M6281 Muscle weakness (generalized): Secondary | ICD-10-CM | POA: Diagnosis not present

## 2016-03-31 DIAGNOSIS — Z9181 History of falling: Secondary | ICD-10-CM | POA: Diagnosis not present

## 2016-03-31 DIAGNOSIS — R41841 Cognitive communication deficit: Secondary | ICD-10-CM | POA: Diagnosis not present

## 2016-03-31 DIAGNOSIS — R1312 Dysphagia, oropharyngeal phase: Secondary | ICD-10-CM | POA: Diagnosis not present

## 2016-04-04 DIAGNOSIS — M6281 Muscle weakness (generalized): Secondary | ICD-10-CM | POA: Diagnosis not present

## 2016-04-04 DIAGNOSIS — S32501D Unspecified fracture of right pubis, subsequent encounter for fracture with routine healing: Secondary | ICD-10-CM | POA: Diagnosis not present

## 2016-04-04 DIAGNOSIS — F0391 Unspecified dementia with behavioral disturbance: Secondary | ICD-10-CM | POA: Diagnosis not present

## 2016-04-04 DIAGNOSIS — S42301D Unspecified fracture of shaft of humerus, right arm, subsequent encounter for fracture with routine healing: Secondary | ICD-10-CM | POA: Diagnosis not present

## 2016-04-08 DIAGNOSIS — S32501D Unspecified fracture of right pubis, subsequent encounter for fracture with routine healing: Secondary | ICD-10-CM | POA: Diagnosis not present

## 2016-04-08 DIAGNOSIS — M6281 Muscle weakness (generalized): Secondary | ICD-10-CM | POA: Diagnosis not present

## 2016-04-08 DIAGNOSIS — F0391 Unspecified dementia with behavioral disturbance: Secondary | ICD-10-CM | POA: Diagnosis not present

## 2016-04-08 DIAGNOSIS — S42301D Unspecified fracture of shaft of humerus, right arm, subsequent encounter for fracture with routine healing: Secondary | ICD-10-CM | POA: Diagnosis not present

## 2016-04-09 DIAGNOSIS — S42301D Unspecified fracture of shaft of humerus, right arm, subsequent encounter for fracture with routine healing: Secondary | ICD-10-CM | POA: Diagnosis not present

## 2016-04-09 DIAGNOSIS — M6281 Muscle weakness (generalized): Secondary | ICD-10-CM | POA: Diagnosis not present

## 2016-04-09 DIAGNOSIS — S32501D Unspecified fracture of right pubis, subsequent encounter for fracture with routine healing: Secondary | ICD-10-CM | POA: Diagnosis not present

## 2016-04-09 DIAGNOSIS — F0391 Unspecified dementia with behavioral disturbance: Secondary | ICD-10-CM | POA: Diagnosis not present

## 2016-04-10 DIAGNOSIS — S32501D Unspecified fracture of right pubis, subsequent encounter for fracture with routine healing: Secondary | ICD-10-CM | POA: Diagnosis not present

## 2016-04-10 DIAGNOSIS — F0391 Unspecified dementia with behavioral disturbance: Secondary | ICD-10-CM | POA: Diagnosis not present

## 2016-04-10 DIAGNOSIS — S42301D Unspecified fracture of shaft of humerus, right arm, subsequent encounter for fracture with routine healing: Secondary | ICD-10-CM | POA: Diagnosis not present

## 2016-04-10 DIAGNOSIS — M6281 Muscle weakness (generalized): Secondary | ICD-10-CM | POA: Diagnosis not present

## 2016-04-11 DIAGNOSIS — S32501D Unspecified fracture of right pubis, subsequent encounter for fracture with routine healing: Secondary | ICD-10-CM | POA: Diagnosis not present

## 2016-04-11 DIAGNOSIS — M6281 Muscle weakness (generalized): Secondary | ICD-10-CM | POA: Diagnosis not present

## 2016-04-11 DIAGNOSIS — S42301D Unspecified fracture of shaft of humerus, right arm, subsequent encounter for fracture with routine healing: Secondary | ICD-10-CM | POA: Diagnosis not present

## 2016-04-11 DIAGNOSIS — F0391 Unspecified dementia with behavioral disturbance: Secondary | ICD-10-CM | POA: Diagnosis not present

## 2016-04-14 DIAGNOSIS — F0391 Unspecified dementia with behavioral disturbance: Secondary | ICD-10-CM | POA: Diagnosis not present

## 2016-04-14 DIAGNOSIS — S32501D Unspecified fracture of right pubis, subsequent encounter for fracture with routine healing: Secondary | ICD-10-CM | POA: Diagnosis not present

## 2016-04-14 DIAGNOSIS — S42301D Unspecified fracture of shaft of humerus, right arm, subsequent encounter for fracture with routine healing: Secondary | ICD-10-CM | POA: Diagnosis not present

## 2016-04-14 DIAGNOSIS — M6281 Muscle weakness (generalized): Secondary | ICD-10-CM | POA: Diagnosis not present

## 2016-04-15 DIAGNOSIS — S42301D Unspecified fracture of shaft of humerus, right arm, subsequent encounter for fracture with routine healing: Secondary | ICD-10-CM | POA: Diagnosis not present

## 2016-04-15 DIAGNOSIS — F0391 Unspecified dementia with behavioral disturbance: Secondary | ICD-10-CM | POA: Diagnosis not present

## 2016-04-15 DIAGNOSIS — S32501D Unspecified fracture of right pubis, subsequent encounter for fracture with routine healing: Secondary | ICD-10-CM | POA: Diagnosis not present

## 2016-04-15 DIAGNOSIS — M6281 Muscle weakness (generalized): Secondary | ICD-10-CM | POA: Diagnosis not present

## 2016-04-16 DIAGNOSIS — M6281 Muscle weakness (generalized): Secondary | ICD-10-CM | POA: Diagnosis not present

## 2016-04-16 DIAGNOSIS — S32501D Unspecified fracture of right pubis, subsequent encounter for fracture with routine healing: Secondary | ICD-10-CM | POA: Diagnosis not present

## 2016-04-16 DIAGNOSIS — S42301D Unspecified fracture of shaft of humerus, right arm, subsequent encounter for fracture with routine healing: Secondary | ICD-10-CM | POA: Diagnosis not present

## 2016-04-16 DIAGNOSIS — F0391 Unspecified dementia with behavioral disturbance: Secondary | ICD-10-CM | POA: Diagnosis not present

## 2016-04-17 DIAGNOSIS — F0391 Unspecified dementia with behavioral disturbance: Secondary | ICD-10-CM | POA: Diagnosis not present

## 2016-04-17 DIAGNOSIS — S32501D Unspecified fracture of right pubis, subsequent encounter for fracture with routine healing: Secondary | ICD-10-CM | POA: Diagnosis not present

## 2016-04-17 DIAGNOSIS — S42301D Unspecified fracture of shaft of humerus, right arm, subsequent encounter for fracture with routine healing: Secondary | ICD-10-CM | POA: Diagnosis not present

## 2016-04-17 DIAGNOSIS — M6281 Muscle weakness (generalized): Secondary | ICD-10-CM | POA: Diagnosis not present

## 2016-04-20 DIAGNOSIS — S42301D Unspecified fracture of shaft of humerus, right arm, subsequent encounter for fracture with routine healing: Secondary | ICD-10-CM | POA: Diagnosis not present

## 2016-04-20 DIAGNOSIS — S32501D Unspecified fracture of right pubis, subsequent encounter for fracture with routine healing: Secondary | ICD-10-CM | POA: Diagnosis not present

## 2016-04-20 DIAGNOSIS — F0391 Unspecified dementia with behavioral disturbance: Secondary | ICD-10-CM | POA: Diagnosis not present

## 2016-04-20 DIAGNOSIS — M6281 Muscle weakness (generalized): Secondary | ICD-10-CM | POA: Diagnosis not present

## 2016-04-21 DIAGNOSIS — S32501D Unspecified fracture of right pubis, subsequent encounter for fracture with routine healing: Secondary | ICD-10-CM | POA: Diagnosis not present

## 2016-04-21 DIAGNOSIS — S42301D Unspecified fracture of shaft of humerus, right arm, subsequent encounter for fracture with routine healing: Secondary | ICD-10-CM | POA: Diagnosis not present

## 2016-04-21 DIAGNOSIS — F0391 Unspecified dementia with behavioral disturbance: Secondary | ICD-10-CM | POA: Diagnosis not present

## 2016-04-21 DIAGNOSIS — M6281 Muscle weakness (generalized): Secondary | ICD-10-CM | POA: Diagnosis not present

## 2016-04-22 DIAGNOSIS — S32501D Unspecified fracture of right pubis, subsequent encounter for fracture with routine healing: Secondary | ICD-10-CM | POA: Diagnosis not present

## 2016-04-22 DIAGNOSIS — M6281 Muscle weakness (generalized): Secondary | ICD-10-CM | POA: Diagnosis not present

## 2016-04-22 DIAGNOSIS — S42301D Unspecified fracture of shaft of humerus, right arm, subsequent encounter for fracture with routine healing: Secondary | ICD-10-CM | POA: Diagnosis not present

## 2016-04-22 DIAGNOSIS — F0391 Unspecified dementia with behavioral disturbance: Secondary | ICD-10-CM | POA: Diagnosis not present

## 2016-04-23 DIAGNOSIS — S32501D Unspecified fracture of right pubis, subsequent encounter for fracture with routine healing: Secondary | ICD-10-CM | POA: Diagnosis not present

## 2016-04-23 DIAGNOSIS — M6281 Muscle weakness (generalized): Secondary | ICD-10-CM | POA: Diagnosis not present

## 2016-04-23 DIAGNOSIS — F0391 Unspecified dementia with behavioral disturbance: Secondary | ICD-10-CM | POA: Diagnosis not present

## 2016-04-23 DIAGNOSIS — S42301D Unspecified fracture of shaft of humerus, right arm, subsequent encounter for fracture with routine healing: Secondary | ICD-10-CM | POA: Diagnosis not present

## 2016-04-24 DIAGNOSIS — M6281 Muscle weakness (generalized): Secondary | ICD-10-CM | POA: Diagnosis not present

## 2016-04-24 DIAGNOSIS — F0391 Unspecified dementia with behavioral disturbance: Secondary | ICD-10-CM | POA: Diagnosis not present

## 2016-04-24 DIAGNOSIS — S32501D Unspecified fracture of right pubis, subsequent encounter for fracture with routine healing: Secondary | ICD-10-CM | POA: Diagnosis not present

## 2016-04-24 DIAGNOSIS — S42301D Unspecified fracture of shaft of humerus, right arm, subsequent encounter for fracture with routine healing: Secondary | ICD-10-CM | POA: Diagnosis not present

## 2016-04-28 DIAGNOSIS — S32501D Unspecified fracture of right pubis, subsequent encounter for fracture with routine healing: Secondary | ICD-10-CM | POA: Diagnosis not present

## 2016-04-28 DIAGNOSIS — M6281 Muscle weakness (generalized): Secondary | ICD-10-CM | POA: Diagnosis not present

## 2016-04-28 DIAGNOSIS — S42301D Unspecified fracture of shaft of humerus, right arm, subsequent encounter for fracture with routine healing: Secondary | ICD-10-CM | POA: Diagnosis not present

## 2016-04-28 DIAGNOSIS — F0391 Unspecified dementia with behavioral disturbance: Secondary | ICD-10-CM | POA: Diagnosis not present

## 2016-04-29 DIAGNOSIS — S32501D Unspecified fracture of right pubis, subsequent encounter for fracture with routine healing: Secondary | ICD-10-CM | POA: Diagnosis not present

## 2016-04-29 DIAGNOSIS — S42301D Unspecified fracture of shaft of humerus, right arm, subsequent encounter for fracture with routine healing: Secondary | ICD-10-CM | POA: Diagnosis not present

## 2016-04-29 DIAGNOSIS — M6281 Muscle weakness (generalized): Secondary | ICD-10-CM | POA: Diagnosis not present

## 2016-04-29 DIAGNOSIS — F0391 Unspecified dementia with behavioral disturbance: Secondary | ICD-10-CM | POA: Diagnosis not present

## 2016-04-30 DIAGNOSIS — M6281 Muscle weakness (generalized): Secondary | ICD-10-CM | POA: Diagnosis not present

## 2016-04-30 DIAGNOSIS — S32501D Unspecified fracture of right pubis, subsequent encounter for fracture with routine healing: Secondary | ICD-10-CM | POA: Diagnosis not present

## 2016-04-30 DIAGNOSIS — S42301D Unspecified fracture of shaft of humerus, right arm, subsequent encounter for fracture with routine healing: Secondary | ICD-10-CM | POA: Diagnosis not present

## 2016-04-30 DIAGNOSIS — S329XXA Fracture of unspecified parts of lumbosacral spine and pelvis, initial encounter for closed fracture: Secondary | ICD-10-CM | POA: Diagnosis not present

## 2016-04-30 DIAGNOSIS — F0391 Unspecified dementia with behavioral disturbance: Secondary | ICD-10-CM | POA: Diagnosis not present

## 2016-04-30 DIAGNOSIS — F0390 Unspecified dementia without behavioral disturbance: Secondary | ICD-10-CM | POA: Diagnosis not present

## 2016-04-30 DIAGNOSIS — F419 Anxiety disorder, unspecified: Secondary | ICD-10-CM | POA: Diagnosis not present

## 2016-05-01 DIAGNOSIS — M6281 Muscle weakness (generalized): Secondary | ICD-10-CM | POA: Diagnosis not present

## 2016-05-01 DIAGNOSIS — S42301D Unspecified fracture of shaft of humerus, right arm, subsequent encounter for fracture with routine healing: Secondary | ICD-10-CM | POA: Diagnosis not present

## 2016-05-01 DIAGNOSIS — F0391 Unspecified dementia with behavioral disturbance: Secondary | ICD-10-CM | POA: Diagnosis not present

## 2016-05-01 DIAGNOSIS — S32501D Unspecified fracture of right pubis, subsequent encounter for fracture with routine healing: Secondary | ICD-10-CM | POA: Diagnosis not present

## 2016-05-04 DIAGNOSIS — S32501D Unspecified fracture of right pubis, subsequent encounter for fracture with routine healing: Secondary | ICD-10-CM | POA: Diagnosis not present

## 2016-05-04 DIAGNOSIS — M6281 Muscle weakness (generalized): Secondary | ICD-10-CM | POA: Diagnosis not present

## 2016-05-04 DIAGNOSIS — S42301D Unspecified fracture of shaft of humerus, right arm, subsequent encounter for fracture with routine healing: Secondary | ICD-10-CM | POA: Diagnosis not present

## 2016-05-04 DIAGNOSIS — F0391 Unspecified dementia with behavioral disturbance: Secondary | ICD-10-CM | POA: Diagnosis not present

## 2016-05-06 DIAGNOSIS — F0391 Unspecified dementia with behavioral disturbance: Secondary | ICD-10-CM | POA: Diagnosis not present

## 2016-05-06 DIAGNOSIS — M6281 Muscle weakness (generalized): Secondary | ICD-10-CM | POA: Diagnosis not present

## 2016-05-06 DIAGNOSIS — S42301D Unspecified fracture of shaft of humerus, right arm, subsequent encounter for fracture with routine healing: Secondary | ICD-10-CM | POA: Diagnosis not present

## 2016-05-06 DIAGNOSIS — S32501D Unspecified fracture of right pubis, subsequent encounter for fracture with routine healing: Secondary | ICD-10-CM | POA: Diagnosis not present

## 2016-05-07 DIAGNOSIS — S42301D Unspecified fracture of shaft of humerus, right arm, subsequent encounter for fracture with routine healing: Secondary | ICD-10-CM | POA: Diagnosis not present

## 2016-05-07 DIAGNOSIS — S32501D Unspecified fracture of right pubis, subsequent encounter for fracture with routine healing: Secondary | ICD-10-CM | POA: Diagnosis not present

## 2016-05-07 DIAGNOSIS — F0391 Unspecified dementia with behavioral disturbance: Secondary | ICD-10-CM | POA: Diagnosis not present

## 2016-05-07 DIAGNOSIS — M6281 Muscle weakness (generalized): Secondary | ICD-10-CM | POA: Diagnosis not present

## 2016-05-11 DIAGNOSIS — S32501D Unspecified fracture of right pubis, subsequent encounter for fracture with routine healing: Secondary | ICD-10-CM | POA: Diagnosis not present

## 2016-05-11 DIAGNOSIS — M6281 Muscle weakness (generalized): Secondary | ICD-10-CM | POA: Diagnosis not present

## 2016-05-11 DIAGNOSIS — S42301D Unspecified fracture of shaft of humerus, right arm, subsequent encounter for fracture with routine healing: Secondary | ICD-10-CM | POA: Diagnosis not present

## 2016-05-11 DIAGNOSIS — F0391 Unspecified dementia with behavioral disturbance: Secondary | ICD-10-CM | POA: Diagnosis not present

## 2016-05-14 DIAGNOSIS — S42301D Unspecified fracture of shaft of humerus, right arm, subsequent encounter for fracture with routine healing: Secondary | ICD-10-CM | POA: Diagnosis not present

## 2016-05-14 DIAGNOSIS — M6281 Muscle weakness (generalized): Secondary | ICD-10-CM | POA: Diagnosis not present

## 2016-05-14 DIAGNOSIS — S32501D Unspecified fracture of right pubis, subsequent encounter for fracture with routine healing: Secondary | ICD-10-CM | POA: Diagnosis not present

## 2016-05-14 DIAGNOSIS — F0391 Unspecified dementia with behavioral disturbance: Secondary | ICD-10-CM | POA: Diagnosis not present

## 2016-05-18 DIAGNOSIS — S42301D Unspecified fracture of shaft of humerus, right arm, subsequent encounter for fracture with routine healing: Secondary | ICD-10-CM | POA: Diagnosis not present

## 2016-05-18 DIAGNOSIS — M6281 Muscle weakness (generalized): Secondary | ICD-10-CM | POA: Diagnosis not present

## 2016-05-18 DIAGNOSIS — S32501D Unspecified fracture of right pubis, subsequent encounter for fracture with routine healing: Secondary | ICD-10-CM | POA: Diagnosis not present

## 2016-05-18 DIAGNOSIS — F0391 Unspecified dementia with behavioral disturbance: Secondary | ICD-10-CM | POA: Diagnosis not present

## 2016-05-19 DIAGNOSIS — S32501D Unspecified fracture of right pubis, subsequent encounter for fracture with routine healing: Secondary | ICD-10-CM | POA: Diagnosis not present

## 2016-05-19 DIAGNOSIS — S42301D Unspecified fracture of shaft of humerus, right arm, subsequent encounter for fracture with routine healing: Secondary | ICD-10-CM | POA: Diagnosis not present

## 2016-05-19 DIAGNOSIS — F0391 Unspecified dementia with behavioral disturbance: Secondary | ICD-10-CM | POA: Diagnosis not present

## 2016-05-19 DIAGNOSIS — M6281 Muscle weakness (generalized): Secondary | ICD-10-CM | POA: Diagnosis not present

## 2016-05-21 DIAGNOSIS — S32501D Unspecified fracture of right pubis, subsequent encounter for fracture with routine healing: Secondary | ICD-10-CM | POA: Diagnosis not present

## 2016-05-21 DIAGNOSIS — F0391 Unspecified dementia with behavioral disturbance: Secondary | ICD-10-CM | POA: Diagnosis not present

## 2016-05-21 DIAGNOSIS — M6281 Muscle weakness (generalized): Secondary | ICD-10-CM | POA: Diagnosis not present

## 2016-05-21 DIAGNOSIS — S42301D Unspecified fracture of shaft of humerus, right arm, subsequent encounter for fracture with routine healing: Secondary | ICD-10-CM | POA: Diagnosis not present

## 2016-05-25 DIAGNOSIS — S42301D Unspecified fracture of shaft of humerus, right arm, subsequent encounter for fracture with routine healing: Secondary | ICD-10-CM | POA: Diagnosis not present

## 2016-05-25 DIAGNOSIS — M6281 Muscle weakness (generalized): Secondary | ICD-10-CM | POA: Diagnosis not present

## 2016-05-25 DIAGNOSIS — S32501D Unspecified fracture of right pubis, subsequent encounter for fracture with routine healing: Secondary | ICD-10-CM | POA: Diagnosis not present

## 2016-05-25 DIAGNOSIS — F0391 Unspecified dementia with behavioral disturbance: Secondary | ICD-10-CM | POA: Diagnosis not present

## 2016-05-27 DIAGNOSIS — F0391 Unspecified dementia with behavioral disturbance: Secondary | ICD-10-CM | POA: Diagnosis not present

## 2016-05-27 DIAGNOSIS — S42301D Unspecified fracture of shaft of humerus, right arm, subsequent encounter for fracture with routine healing: Secondary | ICD-10-CM | POA: Diagnosis not present

## 2016-05-27 DIAGNOSIS — M6281 Muscle weakness (generalized): Secondary | ICD-10-CM | POA: Diagnosis not present

## 2016-05-27 DIAGNOSIS — S32501D Unspecified fracture of right pubis, subsequent encounter for fracture with routine healing: Secondary | ICD-10-CM | POA: Diagnosis not present

## 2016-05-29 DIAGNOSIS — F0391 Unspecified dementia with behavioral disturbance: Secondary | ICD-10-CM | POA: Diagnosis not present

## 2016-05-29 DIAGNOSIS — S42301D Unspecified fracture of shaft of humerus, right arm, subsequent encounter for fracture with routine healing: Secondary | ICD-10-CM | POA: Diagnosis not present

## 2016-05-29 DIAGNOSIS — S32501D Unspecified fracture of right pubis, subsequent encounter for fracture with routine healing: Secondary | ICD-10-CM | POA: Diagnosis not present

## 2016-05-29 DIAGNOSIS — M6281 Muscle weakness (generalized): Secondary | ICD-10-CM | POA: Diagnosis not present

## 2016-06-02 DIAGNOSIS — F0391 Unspecified dementia with behavioral disturbance: Secondary | ICD-10-CM | POA: Diagnosis not present

## 2016-06-02 DIAGNOSIS — M6281 Muscle weakness (generalized): Secondary | ICD-10-CM | POA: Diagnosis not present

## 2016-06-02 DIAGNOSIS — S42301D Unspecified fracture of shaft of humerus, right arm, subsequent encounter for fracture with routine healing: Secondary | ICD-10-CM | POA: Diagnosis not present

## 2016-06-02 DIAGNOSIS — S32501D Unspecified fracture of right pubis, subsequent encounter for fracture with routine healing: Secondary | ICD-10-CM | POA: Diagnosis not present

## 2016-06-03 DIAGNOSIS — S32501D Unspecified fracture of right pubis, subsequent encounter for fracture with routine healing: Secondary | ICD-10-CM | POA: Diagnosis not present

## 2016-06-03 DIAGNOSIS — R41841 Cognitive communication deficit: Secondary | ICD-10-CM | POA: Diagnosis not present

## 2016-06-03 DIAGNOSIS — F0391 Unspecified dementia with behavioral disturbance: Secondary | ICD-10-CM | POA: Diagnosis not present

## 2016-06-03 DIAGNOSIS — M6281 Muscle weakness (generalized): Secondary | ICD-10-CM | POA: Diagnosis not present

## 2016-06-03 DIAGNOSIS — S42301D Unspecified fracture of shaft of humerus, right arm, subsequent encounter for fracture with routine healing: Secondary | ICD-10-CM | POA: Diagnosis not present

## 2016-06-04 DIAGNOSIS — F0391 Unspecified dementia with behavioral disturbance: Secondary | ICD-10-CM | POA: Diagnosis not present

## 2016-06-04 DIAGNOSIS — S32501D Unspecified fracture of right pubis, subsequent encounter for fracture with routine healing: Secondary | ICD-10-CM | POA: Diagnosis not present

## 2016-06-04 DIAGNOSIS — R41841 Cognitive communication deficit: Secondary | ICD-10-CM | POA: Diagnosis not present

## 2016-06-04 DIAGNOSIS — M6281 Muscle weakness (generalized): Secondary | ICD-10-CM | POA: Diagnosis not present

## 2016-06-04 DIAGNOSIS — S42301D Unspecified fracture of shaft of humerus, right arm, subsequent encounter for fracture with routine healing: Secondary | ICD-10-CM | POA: Diagnosis not present

## 2016-06-10 DIAGNOSIS — R41841 Cognitive communication deficit: Secondary | ICD-10-CM | POA: Diagnosis not present

## 2016-06-10 DIAGNOSIS — S32501D Unspecified fracture of right pubis, subsequent encounter for fracture with routine healing: Secondary | ICD-10-CM | POA: Diagnosis not present

## 2016-06-10 DIAGNOSIS — S42301D Unspecified fracture of shaft of humerus, right arm, subsequent encounter for fracture with routine healing: Secondary | ICD-10-CM | POA: Diagnosis not present

## 2016-06-10 DIAGNOSIS — F0391 Unspecified dementia with behavioral disturbance: Secondary | ICD-10-CM | POA: Diagnosis not present

## 2016-06-10 DIAGNOSIS — M6281 Muscle weakness (generalized): Secondary | ICD-10-CM | POA: Diagnosis not present

## 2016-06-11 DIAGNOSIS — R600 Localized edema: Secondary | ICD-10-CM | POA: Diagnosis not present

## 2016-06-11 DIAGNOSIS — R269 Unspecified abnormalities of gait and mobility: Secondary | ICD-10-CM | POA: Diagnosis not present

## 2016-06-11 DIAGNOSIS — F0390 Unspecified dementia without behavioral disturbance: Secondary | ICD-10-CM | POA: Diagnosis not present

## 2016-06-11 DIAGNOSIS — F419 Anxiety disorder, unspecified: Secondary | ICD-10-CM | POA: Diagnosis not present

## 2016-06-11 DIAGNOSIS — K5900 Constipation, unspecified: Secondary | ICD-10-CM | POA: Diagnosis not present

## 2016-06-18 DIAGNOSIS — R41841 Cognitive communication deficit: Secondary | ICD-10-CM | POA: Diagnosis not present

## 2016-06-18 DIAGNOSIS — F0391 Unspecified dementia with behavioral disturbance: Secondary | ICD-10-CM | POA: Diagnosis not present

## 2016-06-18 DIAGNOSIS — M6281 Muscle weakness (generalized): Secondary | ICD-10-CM | POA: Diagnosis not present

## 2016-06-18 DIAGNOSIS — S32501D Unspecified fracture of right pubis, subsequent encounter for fracture with routine healing: Secondary | ICD-10-CM | POA: Diagnosis not present

## 2016-06-18 DIAGNOSIS — S42301D Unspecified fracture of shaft of humerus, right arm, subsequent encounter for fracture with routine healing: Secondary | ICD-10-CM | POA: Diagnosis not present

## 2016-06-22 ENCOUNTER — Emergency Department (HOSPITAL_COMMUNITY)
Admission: EM | Admit: 2016-06-22 | Discharge: 2016-06-23 | Disposition: A | Discharge status: Home / Self Care | Payer: Medicare Other | Attending: Emergency Medicine | Admitting: Emergency Medicine

## 2016-06-22 DIAGNOSIS — Z79899 Other long term (current) drug therapy: Secondary | ICD-10-CM | POA: Insufficient documentation

## 2016-06-22 DIAGNOSIS — F4489 Other dissociative and conversion disorders: Secondary | ICD-10-CM | POA: Diagnosis not present

## 2016-06-22 DIAGNOSIS — I6789 Other cerebrovascular disease: Secondary | ICD-10-CM | POA: Diagnosis not present

## 2016-06-22 DIAGNOSIS — R600 Localized edema: Secondary | ICD-10-CM | POA: Diagnosis not present

## 2016-06-22 DIAGNOSIS — R609 Edema, unspecified: Secondary | ICD-10-CM

## 2016-06-22 DIAGNOSIS — Z87891 Personal history of nicotine dependence: Secondary | ICD-10-CM | POA: Insufficient documentation

## 2016-06-22 DIAGNOSIS — M7989 Other specified soft tissue disorders: Secondary | ICD-10-CM

## 2016-06-22 DIAGNOSIS — I10 Essential (primary) hypertension: Secondary | ICD-10-CM | POA: Diagnosis not present

## 2016-06-22 DIAGNOSIS — R410 Disorientation, unspecified: Secondary | ICD-10-CM | POA: Diagnosis present

## 2016-06-22 LAB — URINALYSIS, ROUTINE W REFLEX MICROSCOPIC
Bilirubin Urine: NEGATIVE
Glucose, UA: NEGATIVE mg/dL
HGB URINE DIPSTICK: NEGATIVE
Ketones, ur: NEGATIVE mg/dL
Nitrite: NEGATIVE
PROTEIN: NEGATIVE mg/dL
SPECIFIC GRAVITY, URINE: 1.027 (ref 1.005–1.030)
pH: 5 (ref 5.0–8.0)

## 2016-06-22 NOTE — ED Triage Notes (Signed)
Patient BIB EMS from home, seen wandering around in drive way by neighbors walking by. Pt cared for by son who says he is having difficulty, reports increased confusion and recent fall. Pt has bilateral edema in lower legs from sitting in recliner all day without having feet elevated. Patients daughter is legal POA but does not have a medical POA.

## 2016-06-22 NOTE — Discharge Instructions (Signed)
Please read and follow all provided instructions.  Your diagnoses today include:  1. Leg swelling   2. Pitting edema     Tests performed today include: Vital signs. See below for your results today.   Medications prescribed:  Take as prescribed. Please take the fluid pill you were described once per day to help with the leg swelling.   Home care instructions:  Follow any educational materials contained in this packet.  Follow-up instructions: Please follow-up with your primary care provider for further evaluation of symptoms and treatment   Return instructions:  Please return to the Emergency Department if you do not get better, if you get worse, or new symptoms OR  - Fever (temperature greater than 101.75F)  - Bleeding that does not stop with holding pressure to the area    -Severe pain (please note that you may be more sore the day after your accident)  - Chest Pain  - Difficulty breathing  - Severe nausea or vomiting  - Inability to tolerate food and liquids  - Passing out  - Skin becoming red around your wounds  - Change in mental status (confusion or lethargy)  - New numbness or weakness    Please return if you have any other emergent concerns.  Additional Information:  Your vital signs today were: BP (!) 159/93 (BP Location: Right Arm)    Pulse 79    Temp 97.4 F (36.3 C) (Oral)    Resp 18    SpO2 97%  If your blood pressure (BP) was elevated above 135/85 this visit, please have this repeated by your doctor within one month. ---------------

## 2016-06-22 NOTE — ED Notes (Signed)
Bed: WHALB Expected date:  Expected time:  Means of arrival:  Comments: No Bed 

## 2016-06-22 NOTE — ED Notes (Signed)
Bed: [id number] Expected date:  Expected time:  Means of arrival:  Comments: 83 yo F  Found outside

## 2016-06-22 NOTE — ED Provider Notes (Signed)
[facility]-EMERGENCY DEPT Provider Note   CSN: [id number] Arrival date & time: 06/22/16  2114     History   Chief Complaint Chief Complaint  Patient presents with  . Altered Mental Status  . Leg Swelling    HPI Yolanda CarwinLoretta Shaw is a 83 y.o. female.  HPI  83 y.o. female with a hx of Dementia, HTN, presents to the Emergency Department today via EMS from home due to wandering around outside on driveway and neighbors notified. Pt states she does not know why she was taken by ambulance. Pt states that she wants to go home and denies any pain. No fevers. Denies any cough, congestion. No abdominal pain. No chest pain/shortness of breath. EMS discussed with patient's son who lives at home with her and it was noted that he had too many drinks to take her to the hospital. EMS states that the son wanted her to be check due to recent increased confusion. Noted bilateral leg swelling recently. Pt states that she does not take any medications. No other symptoms noted  Level V Caveat: Dementia  Pt is able to answer questions appropriately and recollects entire event. Baseline Dementia appears mild  Past Medical History:  Diagnosis Date  . AKI (acute kidney injury) (HCC)   . Bilateral pubic rami fractures (HCC) 06/29/2014  . Closed fracture of right proximal humerus   . Closed stable fracture of multiple pubic rami (HCC) 02/17/2016  . Dementia   . Dementia with behavioral disturbance   . Essential hypertension   . Fracture of left pubis (HCC)   . Ischium fracture (HCC)   . Pelvic fracture (HCC) 06/30/2014  . Pressure ulcer 06/30/2014  . Thrombocytopenia (HCC) 06/30/2014  . Tremor 06/30/2014    Patient Active Problem List   Diagnosis Date Noted  . Anemia 03/02/2016  . Closed stable fracture of multiple pubic rami (HCC) 02/17/2016  . Closed fracture of right proximal humerus   . Essential hypertension   . AKI (acute kidney injury) (HCC)   . Dementia with behavioral disturbance   . Pelvic fracture  (HCC) 06/30/2014  . Thrombocytopenia (HCC) 06/30/2014  . Tremor 06/30/2014  . Pressure ulcer 06/30/2014  . Fracture of left pubis (HCC)   . Ischium fracture (HCC)   . Left hip pain   . Bilateral pubic rami fractures (HCC) 06/29/2014  . Atrial fibrillation (HCC) 03/02/2014    Past Surgical History:  Procedure Laterality Date  . ADENOIDECTOMY    . SHOULDER SURGERY     right  . TONSILLECTOMY    . WRIST SURGERY      OB History    No data available       Home Medications    Prior to Admission medications   Medication Sig Start Date End Date Taking? Authorizing Provider  acetaminophen (TYLENOL) 325 MG tablet Take 650 mg by mouth 2 (two) times daily as needed (for temp greater than 99.5 or every 8 hours as needed for pain).    [provider]  furosemide (LASIX) 20 MG tablet Take 20 mg by mouth daily.    [provider]  haloperidol lactate (HALDOL) 5 MG/ML injection Inject 5 mg into the muscle every 6 (six) hours as needed.    [provider]  HYDROcodone-acetaminophen (NORCO/VICODIN) 5-325 MG tablet Take 1 tablet by mouth every 6 (six) hours as needed for moderate pain.  02/25/16   [provider]  senna (SENOKOT) 8.6 MG TABS tablet Take 1 tablet (8.6 mg total) by mouth daily. 02/19/16  Johnson, Clanford L, MD  UNABLE TO FIND Med Name: Med pass 60 mL by mouth 3 times daily    [provider]    Family History No family history on file.  Social History Social History  Substance Use Topics  . Smoking status: Former Smoker    Quit date: 01/01/1947  . Smokeless tobacco: Never Used  . Alcohol use Yes     Comment: occassional wine     Allergies   Ether; Amitriptyline; and Penicillins   Review of Systems Review of Systems  Unable to perform ROS: Dementia     Physical Exam Updated Vital Signs BP (!) 159/93 (BP Location: Right Arm)   Pulse 79   Temp 97.4 F (36.3 C) (Oral)   Resp 18   SpO2 97%   Physical Exam    Constitutional: She is oriented to person, place, and time. She appears well-developed and well-nourished. No distress.  HENT:  Head: Normocephalic and atraumatic.  Right Ear: Tympanic membrane, external ear and ear canal normal.  Left Ear: Tympanic membrane, external ear and ear canal normal.  Nose: Nose normal.  Mouth/Throat: Uvula is midline, oropharynx is clear and moist and mucous membranes are normal. No trismus in the jaw. No oropharyngeal exudate, posterior oropharyngeal erythema or tonsillar abscesses.  Eyes: EOM are normal. Pupils are equal, round, and reactive to light.  Neck: Normal range of motion. Neck supple. No tracheal deviation present.  Cardiovascular: Normal rate, regular rhythm, S1 normal, S2 normal, normal heart sounds, intact distal pulses and normal pulses.   Pulmonary/Chest: Effort normal and breath sounds normal. No respiratory distress. She has no decreased breath sounds. She has no wheezes. She has no rhonchi. She has no rales.  Abdominal: Normal appearance and bowel sounds are normal. There is no tenderness. There is no rigidity, no rebound, no guarding, no CVA tenderness, no tenderness at McBurney's point and negative Murphy's sign.  Musculoskeletal: Normal range of motion.  1+ pitting edema BLE. No erythema. No signs of infection   Neurological: She is alert and oriented to person, place, and time.  Skin: Skin is warm and dry.  Psychiatric: She has a normal mood and affect. Her speech is normal and behavior is normal. Thought content normal.  Nursing note and vitals reviewed.  ED Treatments / Results  Labs (all labs ordered are listed, but only abnormal results are displayed) Labs Reviewed  URINALYSIS, ROUTINE W REFLEX MICROSCOPIC - Abnormal; Notable for the following:       Result Value   Leukocytes, UA TRACE (*)    Bacteria, UA MANY (*)    Squamous Epithelial / LPF 0-5 (*)    All other components within normal limits    EKG  EKG  Interpretation None       Radiology No results found.  Procedures Procedures (including critical care time)  Medications Ordered in ED Medications - No data to display   Initial Impression / Assessment and Plan / ED Course  I have reviewed the triage vital signs and the nursing notes.  Pertinent labs & imaging results that were available during my care of the patient were reviewed by me and considered in my medical decision making (see chart for details).  Final Clinical Impressions(s) / ED Diagnoses  {I have reviewed and evaluated the relevant laboratory values.   {I have reviewed the relevant previous healthcare records.  {I obtained HPI from historian. {Patient discussed with supervising physician.  ED Course:  Assessment: Pt is a 84 y.o. female  with hx Dementia, HTN who presents via EMS from home due to wandering around outside on driveway and neighbors notified. Pt states she does not know why she was taken by ambulance. Pt states that she wants to go home and denies any pain. No fevers. Denies any cough, congestion. No abdominal pain. No chest pain/shortness of breath. EMS discussed with patient's son who lives at home with her and it was noted that he had too many drinks to take her to the hospital. EMS states that the son wanted her to be check due to recent increased confusion.. On exam, pt in NAD. Nontoxic/nonseptic appearing. VSS. Afebrile. Lungs CTA. Heart RRR. Abdomen nontender soft. UA unremarkable. Spoke with Daughter and states that her Dementia appears baseline. Will notify brother at home and see PCP later this week.. Plan is to DC Home with follow up to PCP. At time of discharge, Patient is in no acute distress. Vital Signs are stable. Patient is able to ambulate. Patient able to tolerate PO.   Disposition/Plan: DC Home Additional Verbal discharge instructions given and discussed with patient.  Pt Instructed to f/u with PCP in the next week for evaluation and  treatment of symptoms. Return precautions given Pt acknowledges and agrees with plan  Supervising Physician Jerelyn Scott, MD  Final diagnoses:  Leg swelling  Pitting edema    New Prescriptions New Prescriptions   No medications on file     Wilber Bihari 06/22/16 2359    Jerelyn Scott, MD 06/23/16 0001

## 2016-06-22 NOTE — ED Notes (Signed)
Pt is refusing to give a urine sample she gets repeating she wants to go home RN Irving Burtonmily made aware

## 2016-06-23 ENCOUNTER — Encounter (HOSPITAL_COMMUNITY): Payer: Self-pay | Admitting: Emergency Medicine

## 2016-06-23 DIAGNOSIS — R4182 Altered mental status, unspecified: Secondary | ICD-10-CM | POA: Diagnosis not present

## 2016-06-23 DIAGNOSIS — F919 Conduct disorder, unspecified: Secondary | ICD-10-CM | POA: Diagnosis not present

## 2016-06-23 NOTE — ED Notes (Signed)
[facility] called for transportation  

## 2016-06-23 NOTE — ED Notes (Addendum)
Patient continues to sit in hallway recliner talking to visitors and staff as they pass. Patient denies pain. Redirected as needed. Pleasant at this time.

## 2016-06-23 NOTE — ED Notes (Signed)
Patient refuses to stay in bed, redirected as needed and placed in hallway recliner. Given sandwich and kept in view of nurses station as well as Diplomatic Services operational officersecretary.

## 2016-06-23 NOTE — ED Notes (Signed)
[facility] arrived to transport patient home 

## 2016-06-25 DIAGNOSIS — S32501D Unspecified fracture of right pubis, subsequent encounter for fracture with routine healing: Secondary | ICD-10-CM | POA: Diagnosis not present

## 2016-06-25 DIAGNOSIS — S42301D Unspecified fracture of shaft of humerus, right arm, subsequent encounter for fracture with routine healing: Secondary | ICD-10-CM | POA: Diagnosis not present

## 2016-06-25 DIAGNOSIS — F0391 Unspecified dementia with behavioral disturbance: Secondary | ICD-10-CM | POA: Diagnosis not present

## 2016-06-25 DIAGNOSIS — R41841 Cognitive communication deficit: Secondary | ICD-10-CM | POA: Diagnosis not present

## 2016-06-25 DIAGNOSIS — M6281 Muscle weakness (generalized): Secondary | ICD-10-CM | POA: Diagnosis not present

## 2016-07-09 DIAGNOSIS — R41841 Cognitive communication deficit: Secondary | ICD-10-CM | POA: Diagnosis not present

## 2016-07-09 DIAGNOSIS — F0391 Unspecified dementia with behavioral disturbance: Secondary | ICD-10-CM | POA: Diagnosis not present

## 2016-07-09 DIAGNOSIS — S32501D Unspecified fracture of right pubis, subsequent encounter for fracture with routine healing: Secondary | ICD-10-CM | POA: Diagnosis not present

## 2016-07-09 DIAGNOSIS — S42301D Unspecified fracture of shaft of humerus, right arm, subsequent encounter for fracture with routine healing: Secondary | ICD-10-CM | POA: Diagnosis not present

## 2016-07-09 DIAGNOSIS — M6281 Muscle weakness (generalized): Secondary | ICD-10-CM | POA: Diagnosis not present

## 2016-07-22 DIAGNOSIS — F0391 Unspecified dementia with behavioral disturbance: Secondary | ICD-10-CM | POA: Diagnosis not present

## 2016-07-22 DIAGNOSIS — S32501D Unspecified fracture of right pubis, subsequent encounter for fracture with routine healing: Secondary | ICD-10-CM | POA: Diagnosis not present

## 2016-07-22 DIAGNOSIS — R41841 Cognitive communication deficit: Secondary | ICD-10-CM | POA: Diagnosis not present

## 2016-07-22 DIAGNOSIS — S42301D Unspecified fracture of shaft of humerus, right arm, subsequent encounter for fracture with routine healing: Secondary | ICD-10-CM | POA: Diagnosis not present

## 2016-07-22 DIAGNOSIS — M6281 Muscle weakness (generalized): Secondary | ICD-10-CM | POA: Diagnosis not present

## 2016-09-14 DIAGNOSIS — R600 Localized edema: Secondary | ICD-10-CM | POA: Diagnosis not present

## 2016-09-14 DIAGNOSIS — F0390 Unspecified dementia without behavioral disturbance: Secondary | ICD-10-CM | POA: Diagnosis not present

## 2016-09-14 DIAGNOSIS — Z23 Encounter for immunization: Secondary | ICD-10-CM | POA: Diagnosis not present

## 2016-09-14 DIAGNOSIS — R269 Unspecified abnormalities of gait and mobility: Secondary | ICD-10-CM | POA: Diagnosis not present

## 2016-09-14 DIAGNOSIS — E46 Unspecified protein-calorie malnutrition: Secondary | ICD-10-CM | POA: Diagnosis not present

## 2016-10-18 ENCOUNTER — Emergency Department (HOSPITAL_COMMUNITY): Payer: Medicare Other

## 2016-10-18 ENCOUNTER — Inpatient Hospital Stay (HOSPITAL_COMMUNITY)
Admission: EM | Admit: 2016-10-18 | Discharge: 2016-10-22 | DRG: 481 | Disposition: A | Discharge status: Skilled Nursing Facility | Payer: Medicare Other | Attending: Internal Medicine | Admitting: Internal Medicine

## 2016-10-18 ENCOUNTER — Encounter (HOSPITAL_COMMUNITY): Payer: Self-pay | Admitting: Emergency Medicine

## 2016-10-18 DIAGNOSIS — S72002A Fracture of unspecified part of neck of left femur, initial encounter for closed fracture: Secondary | ICD-10-CM | POA: Diagnosis present

## 2016-10-18 DIAGNOSIS — Z888 Allergy status to other drugs, medicaments and biological substances status: Secondary | ICD-10-CM

## 2016-10-18 DIAGNOSIS — F0281 Dementia in other diseases classified elsewhere with behavioral disturbance: Secondary | ICD-10-CM | POA: Diagnosis present

## 2016-10-18 DIAGNOSIS — R7303 Prediabetes: Secondary | ICD-10-CM | POA: Diagnosis present

## 2016-10-18 DIAGNOSIS — D6959 Other secondary thrombocytopenia: Secondary | ICD-10-CM | POA: Diagnosis present

## 2016-10-18 DIAGNOSIS — S7292XA Unspecified fracture of left femur, initial encounter for closed fracture: Secondary | ICD-10-CM | POA: Diagnosis not present

## 2016-10-18 DIAGNOSIS — Y92009 Unspecified place in unspecified non-institutional (private) residence as the place of occurrence of the external cause: Secondary | ICD-10-CM

## 2016-10-18 DIAGNOSIS — S199XXA Unspecified injury of neck, initial encounter: Secondary | ICD-10-CM | POA: Diagnosis not present

## 2016-10-18 DIAGNOSIS — D62 Acute posthemorrhagic anemia: Secondary | ICD-10-CM | POA: Diagnosis present

## 2016-10-18 DIAGNOSIS — N179 Acute kidney failure, unspecified: Secondary | ICD-10-CM | POA: Diagnosis present

## 2016-10-18 DIAGNOSIS — Z8249 Family history of ischemic heart disease and other diseases of the circulatory system: Secondary | ICD-10-CM

## 2016-10-18 DIAGNOSIS — M79605 Pain in left leg: Secondary | ICD-10-CM | POA: Diagnosis not present

## 2016-10-18 DIAGNOSIS — S72009A Fracture of unspecified part of neck of unspecified femur, initial encounter for closed fracture: Secondary | ICD-10-CM

## 2016-10-18 DIAGNOSIS — T148XXA Other injury of unspecified body region, initial encounter: Secondary | ICD-10-CM | POA: Diagnosis not present

## 2016-10-18 DIAGNOSIS — Z419 Encounter for procedure for purposes other than remedying health state, unspecified: Secondary | ICD-10-CM

## 2016-10-18 DIAGNOSIS — Z880 Allergy status to penicillin: Secondary | ICD-10-CM

## 2016-10-18 DIAGNOSIS — M542 Cervicalgia: Secondary | ICD-10-CM | POA: Diagnosis not present

## 2016-10-18 DIAGNOSIS — Z87891 Personal history of nicotine dependence: Secondary | ICD-10-CM

## 2016-10-18 DIAGNOSIS — S72012A Unspecified intracapsular fracture of left femur, initial encounter for closed fracture: Secondary | ICD-10-CM | POA: Diagnosis not present

## 2016-10-18 DIAGNOSIS — F0391 Unspecified dementia with behavioral disturbance: Secondary | ICD-10-CM | POA: Diagnosis present

## 2016-10-18 DIAGNOSIS — S72142A Displaced intertrochanteric fracture of left femur, initial encounter for closed fracture: Principal | ICD-10-CM | POA: Diagnosis present

## 2016-10-18 DIAGNOSIS — Z79899 Other long term (current) drug therapy: Secondary | ICD-10-CM

## 2016-10-18 DIAGNOSIS — R51 Headache: Secondary | ICD-10-CM | POA: Diagnosis not present

## 2016-10-18 DIAGNOSIS — S0990XA Unspecified injury of head, initial encounter: Secondary | ICD-10-CM | POA: Diagnosis not present

## 2016-10-18 DIAGNOSIS — W010XXA Fall on same level from slipping, tripping and stumbling without subsequent striking against object, initial encounter: Secondary | ICD-10-CM | POA: Diagnosis present

## 2016-10-18 DIAGNOSIS — F0390 Unspecified dementia without behavioral disturbance: Secondary | ICD-10-CM | POA: Diagnosis present

## 2016-10-18 DIAGNOSIS — W19XXXA Unspecified fall, initial encounter: Secondary | ICD-10-CM

## 2016-10-18 DIAGNOSIS — Z9889 Other specified postprocedural states: Secondary | ICD-10-CM

## 2016-10-18 DIAGNOSIS — I10 Essential (primary) hypertension: Secondary | ICD-10-CM | POA: Diagnosis present

## 2016-10-18 DIAGNOSIS — M25512 Pain in left shoulder: Secondary | ICD-10-CM | POA: Diagnosis not present

## 2016-10-18 DIAGNOSIS — R9431 Abnormal electrocardiogram [ECG] [EKG]: Secondary | ICD-10-CM | POA: Diagnosis not present

## 2016-10-18 DIAGNOSIS — S4992XA Unspecified injury of left shoulder and upper arm, initial encounter: Secondary | ICD-10-CM | POA: Diagnosis not present

## 2016-10-18 DIAGNOSIS — R000 Tachycardia, unspecified: Secondary | ICD-10-CM | POA: Diagnosis present

## 2016-10-18 HISTORY — DX: Anxiety disorder, unspecified: F41.9

## 2016-10-18 HISTORY — DX: Unspecified osteoarthritis, unspecified site: M19.90

## 2016-10-18 HISTORY — DX: Anemia, unspecified: D64.9

## 2016-10-18 MED ORDER — MORPHINE SULFATE (PF) 4 MG/ML IV SOLN: 2.0000 mg | Freq: Once | INTRAVENOUS | Status: DC

## 2016-10-18 MED ORDER — MORPHINE SULFATE (PF) 4 MG/ML IV SOLN
4.0000 mg | Freq: Once | INTRAVENOUS | Status: AC
  Administered 2016-10-18: 4 mg via INTRAVENOUS
  Filled 2016-10-18: qty 1

## 2016-10-18 NOTE — ED Provider Notes (Signed)
[facility]-EMERGENCY DEPT Provider Note   CSN: [id number] Arrival date & time: 10/18/16  2234     History   Chief Complaint Chief Complaint  Patient presents with  . Fall    HPI Yolanda Shaw is a 83 y.o. female.  Level V caveat: dementia   Yolanda Shaw is a 83 y.o. Female who presents to the ED from home after she fell prior to arrival. Per EMS patient took her evening dose of Xanax and got up out of bed and tripped falling on the floor. She complains of lots of pain to her left hip. Patient tells me she believes she may have hit her head but is not sure. She complains of pain to her neck and her left shoulder. She lives at home with her son. Son reports that she is not on any anticoagulants. Patient denies other complaints.   The history is provided by the patient, medical records and a relative. No language interpreter was used.  Fall     Past Medical History:  Diagnosis Date  . AKI (acute kidney injury) (HCC)   . Bilateral pubic rami fractures (HCC) 06/29/2014  . Closed fracture of right proximal humerus   . Closed stable fracture of multiple pubic rami (HCC) 02/17/2016  . Dementia   . Dementia with behavioral disturbance   . Essential hypertension   . Fracture of left pubis (HCC)   . Ischium fracture (HCC)   . Pelvic fracture (HCC) 06/30/2014  . Pressure ulcer 06/30/2014  . Thrombocytopenia (HCC) 06/30/2014  . Tremor 06/30/2014    Patient Active Problem List   Diagnosis Date Noted  . Anemia 03/02/2016  . Closed stable fracture of multiple pubic rami (HCC) 02/17/2016  . Closed fracture of right proximal humerus   . Essential hypertension   . AKI (acute kidney injury) (HCC)   . Dementia with behavioral disturbance   . Pelvic fracture (HCC) 06/30/2014  . Thrombocytopenia (HCC) 06/30/2014  . Tremor 06/30/2014  . Pressure ulcer 06/30/2014  . Fracture of left pubis (HCC)   . Ischium fracture (HCC)   . Left hip pain   . Bilateral pubic rami fractures (HCC) 06/29/2014  .  Atrial fibrillation (HCC) 03/02/2014    Past Surgical History:  Procedure Laterality Date  . ADENOIDECTOMY    . SHOULDER SURGERY     right  . TONSILLECTOMY    . WRIST SURGERY      OB History    No data available       Home Medications    Prior to Admission medications   Medication Sig Start Date End Date Taking? Authorizing Provider  acetaminophen (TYLENOL) 325 MG tablet Take 650 mg by mouth 2 (two) times daily as needed (for temp greater than 99.5 or every 8 hours as needed for pain).   Yes [provider]  ALPRAZolam (XANAX) 0.25 MG tablet Take 0.25 mg by mouth 2 (two) times daily as needed for anxiety.   Yes [provider]  haloperidol lactate (HALDOL) 5 MG/ML injection Inject 5 mg into the muscle every 6 (six) hours as needed (behavioral problems).    Yes [provider]  HYDROcodone-acetaminophen (NORCO/VICODIN) 5-325 MG tablet Take 1 tablet by mouth every 6 (six) hours as needed for moderate pain.  02/25/16  Yes [provider]  memantine (NAMENDA) 10 MG tablet Take 10 mg by mouth 2 (two) times daily.   Yes [provider]  senna (SENOKOT) 8.6 MG TABS tablet Take 1 tablet (8.6 mg total) by mouth  daily. Patient taking differently: Take 1 tablet by mouth daily as needed for mild constipation.  02/19/16  Yes Cleora Fleet, MD    Family History No family history on file.  Social History Social History  Substance Use Topics  . Smoking status: Former Smoker    Quit date: 01/01/1947  . Smokeless tobacco: Never Used  . Alcohol use Yes     Comment: occassional wine     Allergies   Ether; Amitriptyline; and Penicillins   Review of Systems Review of Systems  Unable to perform ROS: Dementia     Physical Exam Updated Vital Signs BP (!) 179/88 (BP Location: Right Arm)   Pulse 78   Temp 98.6 F (37 C) (Oral)   Resp 16   SpO2 97%   Physical Exam  Constitutional: She appears well-developed and well-nourished. No  distress.  HENT:  Head: Normocephalic and atraumatic.  Mouth/Throat: Oropharynx is clear and moist.  No visible or palpated signs of head injury or trauma.  Eyes: Pupils are equal, round, and reactive to light. Conjunctivae are normal. Right eye exhibits no discharge. Left eye exhibits no discharge.  Neck: Neck supple.  Cardiovascular: Normal rate, regular rhythm, normal heart sounds and intact distal pulses.   Bilateral dorsalis pedis and posterior tibialis pulses are intact.  Pulmonary/Chest: Effort normal and breath sounds normal. No respiratory distress. She has no wheezes. She has no rales. She exhibits no tenderness.  Abdominal: Soft. There is no tenderness. There is no guarding.  Musculoskeletal: She exhibits tenderness. She exhibits no edema.  Left leg is shortened and rotated inward. She is tenderness to the lateral aspect of her left hip. No pelvic instability noted.  Lymphadenopathy:    She has no cervical adenopathy.  Neurological: She is alert. Coordination normal.  Oriented to person only. Patient with dementia.  Skin: Skin is warm and dry. Capillary refill takes less than 2 seconds. No rash noted. She is not diaphoretic. No erythema. No pallor.  Psychiatric: She has a normal mood and affect. Her behavior is normal.  Nursing note and vitals reviewed.    ED Treatments / Results  Labs (all labs ordered are listed, but only abnormal results are displayed) Labs Reviewed  BASIC METABOLIC PANEL  CBC WITH DIFFERENTIAL/PLATELET  PROTIME-INR  TYPE AND SCREEN    EKG  EKG Interpretation  Date/Time:  Sunday October 18 2016 23:59:32 EDT Ventricular Rate:  81 PR Interval:    QRS Duration: 73 QT Interval:  368 QTC Calculation: 428 R Axis:     Text Interpretation:  Sinus rhythm Confirmed by Ross Marcus ([id number]) on 10/19/2016 12:03:34 AM       Radiology Dg Chest 1 View  Result Date: 10/18/2016 CLINICAL DATA:  Left hip fracture EXAM: CHEST 1 VIEW COMPARISON:   01/03/2013 FINDINGS: Shallow inspiration. No airspace consolidation. No pneumothorax or significant effusion. Moderate aortic tortuosity, unchanged allowing for differences in projection. Normal pulmonary vasculature. IMPRESSION: No acute cardiopulmonary findings. Electronically Signed   By: Ellery Plunk M.D.   On: 10/18/2016 23:26   Dg Shoulder Left  Result Date: 10/19/2016 CLINICAL DATA:  Initial evaluation for acute trauma, fall, left shoulder pain. EXAM: LEFT SHOULDER - 2+ VIEW COMPARISON:  None. FINDINGS: Bones are diffusely osteopenic. No acute fracture or dislocation. Humeral head in normal line with the glenoid. AC joint approximated. Dystrophic soft tissue calcification noted inferior to the humeral head. No acute soft tissue abnormality. Visualized left hemithorax grossly clear. IMPRESSION: 1. No acute osseous abnormality about the left  shoulder. 2. Osteopenia. Electronically Signed   By: Rise Mu M.D.   On: 10/19/2016 00:30   Dg Hip Unilat With Pelvis 2-3 Views Left  Result Date: 10/18/2016 CLINICAL DATA:  Larey Seat this evening. EXAM: DG HIP (WITH OR WITHOUT PELVIS) 2-3V LEFT COMPARISON:  None. FINDINGS: There is an intertrochanteric left hip fracture with varus angulation. No dislocation. No radiographic findings to suggest a pathologic basis for the fracture. Healed pubic ramus fractures are present bilaterally. IMPRESSION: Intertrochanteric left hip fracture. Electronically Signed   By: Ellery Plunk M.D.   On: 10/18/2016 23:25    Procedures Procedures (including critical care time)  Medications Ordered in ED Medications  morphine 4 MG/ML injection 4 mg (4 mg Intravenous Given 10/18/16 2352)     Initial Impression / Assessment and Plan / ED Course  I have reviewed the triage vital signs and the nursing notes.  Pertinent labs & imaging results that were available during my care of the patient were reviewed by me and considered in my medical decision making (see  chart for details).    This is a 83 y.o. Female who presents to the ED from home after she fell prior to arrival. Per EMS patient took her evening dose of Xanax and got up out of bed and tripped falling on the floor. She complains of lots of pain to her left hip. Patient tells me she believes she may have hit her head but is not sure. She complains of pain to her neck and her left shoulder. She lives at home with her son. Son reports that she is not on any anticoagulants. Patient denies other complaints.  On exam the patient is afebrile nontoxic appearing. She has pain at her left hip with shortening and rotation of her left leg. She is neurovascular intact. X-ray shows intertrochanteric fracture of her left hip. She complains of pain to her neck and her left shoulder as well. It is unclear if this is chronic or new. Will obtain CT head and neck and left shoulder x-ray as well.   At shift change patient is awaiting CT scans and blood work. Plan is for admission following additional imaging. Patient care signed out to Sharilyn Sites, PA-C at shift change. She will disposition the patient following imaging.    Final Clinical Impressions(s) / ED Diagnoses   Final diagnoses:  Closed fracture of left hip, initial encounter [facility])  Fall, initial encounter    New Prescriptions New Prescriptions   No medications on file     Everlene Farrier, Cordelia Poche 10/19/16 [id number]    Shon Baton, MD 10/23/16 [phone]

## 2016-10-18 NOTE — ED Notes (Signed)
Patient transported to X-ray 

## 2016-10-18 NOTE — ED Triage Notes (Signed)
Pt BIB [facility] for fall. Pt had been given her night time xanax and went to bed. Got up to go to the bathroom and fell while using her walker. Left leg is shortened and rotated. Good distal pulses in bilateral feet. Hx of hip fracture unsure of which side. of fentynl given in route

## 2016-10-19 ENCOUNTER — Emergency Department (HOSPITAL_COMMUNITY): Payer: Medicare Other

## 2016-10-19 ENCOUNTER — Encounter (HOSPITAL_COMMUNITY)
Admission: EM | Disposition: A | Discharge status: Skilled Nursing Facility | Payer: Self-pay | Source: Home / Self Care | Attending: Nephrology

## 2016-10-19 ENCOUNTER — Inpatient Hospital Stay (HOSPITAL_COMMUNITY): Payer: Medicare Other

## 2016-10-19 ENCOUNTER — Inpatient Hospital Stay (HOSPITAL_COMMUNITY): Payer: Medicare Other | Admitting: Certified Registered Nurse Anesthetist

## 2016-10-19 ENCOUNTER — Encounter (HOSPITAL_COMMUNITY): Payer: Self-pay | Admitting: Internal Medicine

## 2016-10-19 DIAGNOSIS — W010XXA Fall on same level from slipping, tripping and stumbling without subsequent striking against object, initial encounter: Secondary | ICD-10-CM | POA: Diagnosis present

## 2016-10-19 DIAGNOSIS — R51 Headache: Secondary | ICD-10-CM | POA: Diagnosis not present

## 2016-10-19 DIAGNOSIS — S4992XA Unspecified injury of left shoulder and upper arm, initial encounter: Secondary | ICD-10-CM | POA: Diagnosis not present

## 2016-10-19 DIAGNOSIS — S72002D Fracture of unspecified part of neck of left femur, subsequent encounter for closed fracture with routine healing: Secondary | ICD-10-CM | POA: Diagnosis not present

## 2016-10-19 DIAGNOSIS — I10 Essential (primary) hypertension: Secondary | ICD-10-CM | POA: Diagnosis not present

## 2016-10-19 DIAGNOSIS — S72009A Fracture of unspecified part of neck of unspecified femur, initial encounter for closed fracture: Secondary | ICD-10-CM | POA: Diagnosis present

## 2016-10-19 DIAGNOSIS — Y92009 Unspecified place in unspecified non-institutional (private) residence as the place of occurrence of the external cause: Secondary | ICD-10-CM | POA: Diagnosis not present

## 2016-10-19 DIAGNOSIS — R4182 Altered mental status, unspecified: Secondary | ICD-10-CM | POA: Diagnosis not present

## 2016-10-19 DIAGNOSIS — N179 Acute kidney failure, unspecified: Secondary | ICD-10-CM | POA: Diagnosis not present

## 2016-10-19 DIAGNOSIS — F0281 Dementia in other diseases classified elsewhere with behavioral disturbance: Secondary | ICD-10-CM | POA: Diagnosis not present

## 2016-10-19 DIAGNOSIS — Z880 Allergy status to penicillin: Secondary | ICD-10-CM | POA: Diagnosis not present

## 2016-10-19 DIAGNOSIS — D6959 Other secondary thrombocytopenia: Secondary | ICD-10-CM | POA: Diagnosis not present

## 2016-10-19 DIAGNOSIS — D62 Acute posthemorrhagic anemia: Secondary | ICD-10-CM | POA: Diagnosis not present

## 2016-10-19 DIAGNOSIS — Z8249 Family history of ischemic heart disease and other diseases of the circulatory system: Secondary | ICD-10-CM | POA: Diagnosis not present

## 2016-10-19 DIAGNOSIS — S72012S Unspecified intracapsular fracture of left femur, sequela: Secondary | ICD-10-CM | POA: Diagnosis not present

## 2016-10-19 DIAGNOSIS — R41841 Cognitive communication deficit: Secondary | ICD-10-CM | POA: Diagnosis not present

## 2016-10-19 DIAGNOSIS — I4891 Unspecified atrial fibrillation: Secondary | ICD-10-CM | POA: Diagnosis not present

## 2016-10-19 DIAGNOSIS — Z87891 Personal history of nicotine dependence: Secondary | ICD-10-CM | POA: Diagnosis not present

## 2016-10-19 DIAGNOSIS — S72002A Fracture of unspecified part of neck of left femur, initial encounter for closed fracture: Secondary | ICD-10-CM | POA: Diagnosis not present

## 2016-10-19 DIAGNOSIS — Z888 Allergy status to other drugs, medicaments and biological substances status: Secondary | ICD-10-CM | POA: Diagnosis not present

## 2016-10-19 DIAGNOSIS — Z79899 Other long term (current) drug therapy: Secondary | ICD-10-CM | POA: Diagnosis not present

## 2016-10-19 DIAGNOSIS — R7303 Prediabetes: Secondary | ICD-10-CM | POA: Diagnosis present

## 2016-10-19 DIAGNOSIS — D696 Thrombocytopenia, unspecified: Secondary | ICD-10-CM | POA: Diagnosis not present

## 2016-10-19 DIAGNOSIS — R278 Other lack of coordination: Secondary | ICD-10-CM | POA: Diagnosis not present

## 2016-10-19 DIAGNOSIS — R1312 Dysphagia, oropharyngeal phase: Secondary | ICD-10-CM | POA: Diagnosis not present

## 2016-10-19 DIAGNOSIS — M25512 Pain in left shoulder: Secondary | ICD-10-CM | POA: Diagnosis not present

## 2016-10-19 DIAGNOSIS — M542 Cervicalgia: Secondary | ICD-10-CM | POA: Diagnosis not present

## 2016-10-19 DIAGNOSIS — S199XXA Unspecified injury of neck, initial encounter: Secondary | ICD-10-CM | POA: Diagnosis not present

## 2016-10-19 DIAGNOSIS — Z9889 Other specified postprocedural states: Secondary | ICD-10-CM | POA: Diagnosis not present

## 2016-10-19 DIAGNOSIS — M6281 Muscle weakness (generalized): Secondary | ICD-10-CM | POA: Diagnosis not present

## 2016-10-19 DIAGNOSIS — F0391 Unspecified dementia with behavioral disturbance: Secondary | ICD-10-CM | POA: Diagnosis not present

## 2016-10-19 DIAGNOSIS — R000 Tachycardia, unspecified: Secondary | ICD-10-CM | POA: Diagnosis present

## 2016-10-19 DIAGNOSIS — S0990XA Unspecified injury of head, initial encounter: Secondary | ICD-10-CM | POA: Diagnosis not present

## 2016-10-19 DIAGNOSIS — S72012D Unspecified intracapsular fracture of left femur, subsequent encounter for closed fracture with routine healing: Secondary | ICD-10-CM | POA: Diagnosis not present

## 2016-10-19 DIAGNOSIS — Z4789 Encounter for other orthopedic aftercare: Secondary | ICD-10-CM | POA: Diagnosis not present

## 2016-10-19 DIAGNOSIS — S7292XA Unspecified fracture of left femur, initial encounter for closed fracture: Secondary | ICD-10-CM | POA: Diagnosis not present

## 2016-10-19 DIAGNOSIS — S72142A Displaced intertrochanteric fracture of left femur, initial encounter for closed fracture: Secondary | ICD-10-CM | POA: Diagnosis not present

## 2016-10-19 HISTORY — PX: INTRAMEDULLARY (IM) NAIL INTERTROCHANTERIC: SHX5875

## 2016-10-19 LAB — CBC
HCT: 41.1 % (ref 36.0–46.0)
HEMOGLOBIN: 13.3 g/dL (ref 12.0–15.0)
MCH: 30.6 pg (ref 26.0–34.0)
MCHC: 32.4 g/dL (ref 30.0–36.0)
MCV: 94.5 fL (ref 78.0–100.0)
PLATELETS: 123 10*3/uL — AB (ref 150–400)
RBC: 4.35 MIL/uL (ref 3.87–5.11)
RDW: 14.6 % (ref 11.5–15.5)
WBC: 15.7 10*3/uL — AB (ref 4.0–10.5)

## 2016-10-19 LAB — HEMOGLOBIN A1C
HEMOGLOBIN A1C: 5.7 % — AB (ref 4.8–5.6)
MEAN PLASMA GLUCOSE: 116.89 mg/dL

## 2016-10-19 LAB — CBC WITH DIFFERENTIAL/PLATELET
Basophils Absolute: 0 10*3/uL (ref 0.0–0.1)
Basophils Relative: 0 %
EOS PCT: 0 %
Eosinophils Absolute: 0 10*3/uL (ref 0.0–0.7)
HCT: 42.9 % (ref 36.0–46.0)
HEMOGLOBIN: 14.4 g/dL (ref 12.0–15.0)
LYMPHS ABS: 0.6 10*3/uL — AB (ref 0.7–4.0)
LYMPHS PCT: 4 %
MCH: 31.4 pg (ref 26.0–34.0)
MCHC: 33.6 g/dL (ref 30.0–36.0)
MCV: 93.7 fL (ref 78.0–100.0)
MONOS PCT: 7 %
Monocytes Absolute: 1.2 10*3/uL — ABNORMAL HIGH (ref 0.1–1.0)
NEUTROS PCT: 89 %
Neutro Abs: 14.9 10*3/uL — ABNORMAL HIGH (ref 1.7–7.7)
Platelets: 135 10*3/uL — ABNORMAL LOW (ref 150–400)
RBC: 4.58 MIL/uL (ref 3.87–5.11)
RDW: 14.5 % (ref 11.5–15.5)
WBC: 16.7 10*3/uL — AB (ref 4.0–10.5)

## 2016-10-19 LAB — MRSA PCR SCREENING: MRSA by PCR: NEGATIVE

## 2016-10-19 LAB — GLUCOSE, CAPILLARY
GLUCOSE-CAPILLARY: 126 mg/dL — AB (ref 65–99)
Glucose-Capillary: 114 mg/dL — ABNORMAL HIGH (ref 65–99)

## 2016-10-19 LAB — BASIC METABOLIC PANEL
ANION GAP: 9 (ref 5–15)
ANION GAP: 10 (ref 5–15)
BUN: 27 mg/dL — ABNORMAL HIGH (ref 6–20)
BUN: 24 mg/dL — AB (ref 6–20)
CHLORIDE: 111 mmol/L (ref 101–111)
CHLORIDE: 110 mmol/L (ref 101–111)
CO2: 21 mmol/L — ABNORMAL LOW (ref 22–32)
CO2: 21 mmol/L — AB (ref 22–32)
Calcium: 8.9 mg/dL (ref 8.9–10.3)
Calcium: 8.8 mg/dL — ABNORMAL LOW (ref 8.9–10.3)
Creatinine, Ser: 1 mg/dL (ref 0.44–1.00)
Creatinine, Ser: 1.1 mg/dL — ABNORMAL HIGH (ref 0.44–1.00)
GFR calc non Af Amer: 51 mL/min — ABNORMAL LOW (ref >60)
GFR, EST AFRICAN AMERICAN: 52 mL/min — AB (ref >60)
GFR, EST AFRICAN AMERICAN: 59 mL/min — AB (ref >60)
GFR, EST NON AFRICAN AMERICAN: 45 mL/min — AB (ref >60)
Glucose, Bld: 167 mg/dL — ABNORMAL HIGH (ref 65–99)
Glucose, Bld: 171 mg/dL — ABNORMAL HIGH (ref 65–99)
POTASSIUM: 4.6 mmol/L (ref 3.5–5.1)
POTASSIUM: 4.4 mmol/L (ref 3.5–5.1)
SODIUM: 141 mmol/L (ref 135–145)
Sodium: 141 mmol/L (ref 135–145)

## 2016-10-19 LAB — PROTIME-INR
INR: 1.07
PROTHROMBIN TIME: 13.8 s (ref 11.4–15.2)

## 2016-10-19 LAB — TYPE AND SCREEN
ABO/RH(D): AB POS
Antibody Screen: NEGATIVE

## 2016-10-19 LAB — ABO/RH: ABO/RH(D): AB POS

## 2016-10-19 SURGERY — FIXATION, FRACTURE, INTERTROCHANTERIC, WITH INTRAMEDULLARY ROD
Anesthesia: General | Site: Hip | Laterality: Left

## 2016-10-19 MED ORDER — ONDANSETRON HCL 4 MG/2ML IJ SOLN
INTRAMUSCULAR | Status: DC | PRN
  Administered 2016-10-19: 4 mg via INTRAVENOUS

## 2016-10-19 MED ORDER — MORPHINE SULFATE (PF) 4 MG/ML IV SOLN
2.0000 mg | Freq: Once | INTRAVENOUS | Status: AC
  Administered 2016-10-19: 2 mg via INTRAVENOUS
  Filled 2016-10-19: qty 1

## 2016-10-19 MED ORDER — PROPOFOL 10 MG/ML IV BOLUS
INTRAVENOUS | Status: AC
  Filled 2016-10-19: qty 20

## 2016-10-19 MED ORDER — ROCURONIUM BROMIDE 50 MG/5ML IV SOLN
INTRAVENOUS | Status: AC
  Filled 2016-10-19: qty 3

## 2016-10-19 MED ORDER — OXYCODONE HCL 5 MG/5ML PO SOLN: 5.0000 mg | Freq: Once | ORAL | Status: DC | PRN

## 2016-10-19 MED ORDER — DEXAMETHASONE SODIUM PHOSPHATE 10 MG/ML IJ SOLN
INTRAMUSCULAR | Status: AC
  Filled 2016-10-19: qty 2

## 2016-10-19 MED ORDER — 0.9 % SODIUM CHLORIDE (POUR BTL) OPTIME
TOPICAL | Status: DC | PRN
  Administered 2016-10-19: 1000 mL

## 2016-10-19 MED ORDER — SUGAMMADEX SODIUM 200 MG/2ML IV SOLN
INTRAVENOUS | Status: DC | PRN
  Administered 2016-10-19: 150 mg via INTRAVENOUS

## 2016-10-19 MED ORDER — MEMANTINE HCL 10 MG PO TABS
10.0000 mg | ORAL_TABLET | Freq: Two times a day (BID) | ORAL | Status: DC
  Administered 2016-10-20 – 2016-10-22 (×5): 10 mg via ORAL
  Filled 2016-10-19 (×6): qty 1

## 2016-10-19 MED ORDER — FENTANYL CITRATE (PF) 250 MCG/5ML IJ SOLN
INTRAMUSCULAR | Status: AC
Start: 2016-10-19 — End: 2016-10-19
  Filled 2016-10-19: qty 5

## 2016-10-19 MED ORDER — FENTANYL CITRATE (PF) 100 MCG/2ML IJ SOLN
50.0000 ug | Freq: Once | INTRAMUSCULAR | Status: AC
  Administered 2016-10-19: 50 ug via INTRAVENOUS

## 2016-10-19 MED ORDER — DEXAMETHASONE SODIUM PHOSPHATE 4 MG/ML IJ SOLN
INTRAMUSCULAR | Status: DC | PRN
  Administered 2016-10-19: 8 mg via INTRAVENOUS

## 2016-10-19 MED ORDER — LIDOCAINE 2% (20 MG/ML) 5 ML SYRINGE
INTRAMUSCULAR | Status: AC
  Filled 2016-10-19: qty 25

## 2016-10-19 MED ORDER — EPHEDRINE SULFATE 50 MG/ML IJ SOLN
INTRAMUSCULAR | Status: DC | PRN
  Administered 2016-10-19: 5 mg via INTRAVENOUS

## 2016-10-19 MED ORDER — FENTANYL CITRATE (PF) 100 MCG/2ML IJ SOLN
INTRAMUSCULAR | Status: DC | PRN
  Administered 2016-10-19 (×2): 25 ug via INTRAVENOUS

## 2016-10-19 MED ORDER — FAMOTIDINE IN NACL 20-0.9 MG/50ML-% IV SOLN
20.0000 mg | INTRAVENOUS | Status: DC
  Filled 2016-10-19 (×2): qty 50

## 2016-10-19 MED ORDER — ROCURONIUM BROMIDE 100 MG/10ML IV SOLN
INTRAVENOUS | Status: DC | PRN
  Administered 2016-10-19: 30 mg via INTRAVENOUS

## 2016-10-19 MED ORDER — MORPHINE SULFATE (PF) 4 MG/ML IV SOLN
0.5000 mg | INTRAVENOUS | Status: DC | PRN
  Administered 2016-10-19 – 2016-10-20 (×6): 0.52 mg via INTRAVENOUS
  Filled 2016-10-19 (×5): qty 1

## 2016-10-19 MED ORDER — LIDOCAINE HCL (CARDIAC) 20 MG/ML IV SOLN
INTRAVENOUS | Status: DC | PRN
  Administered 2016-10-19: 100 mg via INTRAVENOUS

## 2016-10-19 MED ORDER — PHENYLEPHRINE HCL 10 MG/ML IJ SOLN
INTRAMUSCULAR | Status: DC | PRN
  Administered 2016-10-19: 100 ug/min via INTRAVENOUS

## 2016-10-19 MED ORDER — HALOPERIDOL LACTATE 5 MG/ML IJ SOLN
5.0000 mg | Freq: Four times a day (QID) | INTRAMUSCULAR | Status: DC | PRN
  Administered 2016-10-20: 5 mg via INTRAMUSCULAR
  Filled 2016-10-19: qty 1

## 2016-10-19 MED ORDER — ALPRAZOLAM 0.25 MG PO TABS
0.2500 mg | ORAL_TABLET | Freq: Two times a day (BID) | ORAL | Status: DC | PRN
  Administered 2016-10-20: 0.25 mg via ORAL
  Filled 2016-10-19 (×2): qty 1

## 2016-10-19 MED ORDER — PHENYLEPHRINE HCL 10 MG/ML IJ SOLN
INTRAMUSCULAR | Status: DC | PRN
  Administered 2016-10-19: 80 ug via INTRAVENOUS
  Administered 2016-10-19 (×2): 120 ug via INTRAVENOUS

## 2016-10-19 MED ORDER — MORPHINE SULFATE (PF) 4 MG/ML IV SOLN
2.0000 mg | INTRAVENOUS | Status: DC | PRN
  Filled 2016-10-19: qty 1

## 2016-10-19 MED ORDER — MEPERIDINE HCL 25 MG/ML IJ SOLN: 6.2500 mg | INTRAMUSCULAR | Status: DC | PRN

## 2016-10-19 MED ORDER — OXYCODONE HCL 5 MG PO TABS: 5.0000 mg | ORAL_TABLET | Freq: Once | ORAL | Status: DC | PRN

## 2016-10-19 MED ORDER — PROMETHAZINE HCL 25 MG/ML IJ SOLN: 6.2500 mg | INTRAMUSCULAR | Status: DC | PRN

## 2016-10-19 MED ORDER — ALBUMIN HUMAN 5 % IV SOLN
INTRAVENOUS | Status: DC | PRN
  Administered 2016-10-19: 21:00:00 via INTRAVENOUS

## 2016-10-19 MED ORDER — LACTATED RINGERS IV SOLN
INTRAVENOUS | Status: DC | PRN
  Administered 2016-10-19: 21:00:00 via INTRAVENOUS

## 2016-10-19 MED ORDER — ONDANSETRON HCL 4 MG/2ML IJ SOLN
INTRAMUSCULAR | Status: AC
  Filled 2016-10-19: qty 4

## 2016-10-19 MED ORDER — FENTANYL CITRATE (PF) 100 MCG/2ML IJ SOLN
INTRAMUSCULAR | Status: AC
  Administered 2016-10-19: 50 ug via INTRAVENOUS
  Filled 2016-10-19: qty 2

## 2016-10-19 MED ORDER — PROPOFOL 10 MG/ML IV BOLUS
INTRAVENOUS | Status: DC | PRN
  Administered 2016-10-19: 60 mg via INTRAVENOUS

## 2016-10-19 MED ORDER — SODIUM CHLORIDE 0.9 % IV BOLUS (SEPSIS): 250.0000 mL | Freq: Once | INTRAVENOUS | Status: DC

## 2016-10-19 MED ORDER — HYDROMORPHONE HCL 1 MG/ML IJ SOLN: 0.2500 mg | INTRAMUSCULAR | Status: DC | PRN

## 2016-10-19 MED ORDER — CLINDAMYCIN PHOSPHATE 900 MG/50ML IV SOLN
900.0000 mg | INTRAVENOUS | Status: AC
  Administered 2016-10-19: 900 mg via INTRAVENOUS
  Filled 2016-10-19 (×2): qty 50

## 2016-10-19 MED ORDER — PHENYLEPHRINE 40 MCG/ML (10ML) SYRINGE FOR IV PUSH (FOR BLOOD PRESSURE SUPPORT)
PREFILLED_SYRINGE | INTRAVENOUS | Status: AC
  Filled 2016-10-19: qty 30

## 2016-10-19 MED ORDER — CHLORHEXIDINE GLUCONATE 4 % EX LIQD: 60.0000 mL | Freq: Once | CUTANEOUS | Status: DC

## 2016-10-19 MED ORDER — POTASSIUM CHLORIDE IN NACL 20-0.9 MEQ/L-% IV SOLN
INTRAVENOUS | Status: DC
  Administered 2016-10-19: 16:00:00 via INTRAVENOUS
  Filled 2016-10-19: qty 1000

## 2016-10-19 MED ORDER — POVIDONE-IODINE 10 % EX SWAB: 2.0000 "application " | Freq: Once | CUTANEOUS | Status: DC

## 2016-10-19 SURGICAL SUPPLY — 49 items
BLADE CLIPPER SURG (BLADE) IMPLANT
BLADE SURG 15 STRL LF DISP TIS (BLADE) IMPLANT
BLADE SURG 15 STRL SS (BLADE)
COVER PERINEAL POST (MISCELLANEOUS) ×3 IMPLANT
COVER SURGICAL LIGHT HANDLE (MISCELLANEOUS) ×3 IMPLANT
DRAPE HALF SHEET 40X57 (DRAPES) IMPLANT
DRAPE ORTHO SPLIT 77X108 STRL (DRAPES)
DRAPE STERI IOBAN 125X83 (DRAPES) ×3 IMPLANT
DRAPE SURG ORHT 6 SPLT 77X108 (DRAPES) IMPLANT
DRSG AQUACEL AG ADV 3.5X 4 (GAUZE/BANDAGES/DRESSINGS) ×6 IMPLANT
DRSG MEPILEX BORDER 4X4 (GAUZE/BANDAGES/DRESSINGS) IMPLANT
DRSG MEPILEX BORDER 4X8 (GAUZE/BANDAGES/DRESSINGS) ×3 IMPLANT
DURAPREP 26ML APPLICATOR (WOUND CARE) ×3 IMPLANT
ELECT REM PT RETURN 9FT ADLT (ELECTROSURGICAL) ×3
ELECTRODE REM PT RTRN 9FT ADLT (ELECTROSURGICAL) ×1 IMPLANT
FACESHIELD WRAPAROUND (MASK) IMPLANT
GAUZE XEROFORM 5X9 LF (GAUZE/BANDAGES/DRESSINGS) ×3 IMPLANT
GLOVE BIOGEL PI IND STRL 7.0 (GLOVE) ×1 IMPLANT
GLOVE BIOGEL PI IND STRL 8 (GLOVE) ×1 IMPLANT
GLOVE BIOGEL PI INDICATOR 7.0 (GLOVE) ×2
GLOVE BIOGEL PI INDICATOR 8 (GLOVE) ×2
GLOVE ECLIPSE 7.0 STRL STRAW (GLOVE) ×3 IMPLANT
GLOVE SURG ORTHO 8.0 STRL STRW (GLOVE) ×3 IMPLANT
GOWN STRL REUS W/ TWL LRG LVL3 (GOWN DISPOSABLE) ×1 IMPLANT
GOWN STRL REUS W/ TWL XL LVL3 (GOWN DISPOSABLE) IMPLANT
GOWN STRL REUS W/TWL LRG LVL3 (GOWN DISPOSABLE) ×2
GOWN STRL REUS W/TWL XL LVL3 (GOWN DISPOSABLE)
GUIDE PIN 3.2X343 (PIN) ×2
GUIDE PIN 3.2X343MM (PIN) ×4
KIT BASIN OR (CUSTOM PROCEDURE TRAY) ×3 IMPLANT
KIT ROOM TURNOVER OR (KITS) ×3 IMPLANT
LINER BOOT UNIVERSAL DISP (MISCELLANEOUS) ×3 IMPLANT
MANIFOLD NEPTUNE II (INSTRUMENTS) ×3 IMPLANT
NAIL LEFT 10X36 (Nail) ×3 IMPLANT
NS IRRIG 1000ML POUR BTL (IV SOLUTION) ×3 IMPLANT
PACK GENERAL/GYN (CUSTOM PROCEDURE TRAY) ×3 IMPLANT
PAD ARMBOARD 7.5X6 YLW CONV (MISCELLANEOUS) ×6 IMPLANT
PIN GUIDE 3.2X343MM (PIN) ×2 IMPLANT
SCREW LAG COMPR KIT 95/90 (Screw) ×3 IMPLANT
SPONGE LAP 4X18 X RAY DECT (DISPOSABLE) IMPLANT
STAPLER VISISTAT 35W (STAPLE) ×3 IMPLANT
SUT ETHILON 2 0 FS 18 (SUTURE) IMPLANT
SUT ETHILON 3 0 PS 1 (SUTURE) ×9 IMPLANT
SUT VIC AB 2-0 CTB1 (SUTURE) ×3 IMPLANT
SUT VICRYL 0 UR6 27IN ABS (SUTURE) ×6 IMPLANT
TAPE STRIPS DRAPE STRL (GAUZE/BANDAGES/DRESSINGS) IMPLANT
TOWEL OR 17X24 6PK STRL BLUE (TOWEL DISPOSABLE) ×3 IMPLANT
TOWEL OR 17X26 10 PK STRL BLUE (TOWEL DISPOSABLE) ×3 IMPLANT
WATER STERILE IRR 1000ML POUR (IV SOLUTION) IMPLANT

## 2016-10-19 NOTE — Anesthesia Procedure Notes (Addendum)
Procedure Name: Intubation Date/Time: 10/19/2016 8:37 PM Performed by: Oletta Lamas Pre-anesthesia Checklist: Patient identified, Emergency Drugs available, Suction available and Patient being monitored Patient Re-evaluated:Patient Re-evaluated prior to induction Oxygen Delivery Method: Circle System Utilized Preoxygenation: Pre-oxygenation with 100% oxygen Induction Type: IV induction Ventilation: Mask ventilation without difficulty Laryngoscope Size: Mac and 3 Grade View: Grade II Tube type: Oral Tube size: 7.0 mm Number of attempts: 1 Airway Equipment and Method: Stylet Placement Confirmation: ETT inserted through vocal cords under direct vision,  positive ETCO2 and breath sounds checked- equal and bilateral Secured at: 22 cm Tube secured with: Tape Dental Injury: Teeth and Oropharynx as per pre-operative assessment

## 2016-10-19 NOTE — Progress Notes (Signed)
Orthopedic Tech Progress Note Patient Details:  Zanai Mallari Sep 30, 1931 [id number]  Patient ID: [name], female   DOB: March 17, 1931, 83 y.o.   MRN: [id number] Pt cant have ohf due to age restrictions  Trinna Post 10/19/2016, 7:15 PM

## 2016-10-19 NOTE — Anesthesia Preprocedure Evaluation (Signed)
Anesthesia Evaluation  Patient identified by MRN, date of birth, ID band Patient awake    Reviewed: Allergy & Precautions, NPO status , Patient's Chart, lab work & pertinent test results  Airway Mallampati: II  TM Distance: >3 FB Neck ROM: Full    Dental no notable dental hx.    Pulmonary neg pulmonary ROS, former smoker,    Pulmonary exam normal breath sounds clear to auscultation       Cardiovascular hypertension, negative cardio ROS Normal cardiovascular exam Rhythm:Regular Rate:Normal     Neuro/Psych PSYCHIATRIC DISORDERS Anxiety negative neurological ROS     GI/Hepatic negative GI ROS, Neg liver ROS,   Endo/Other  negative endocrine ROS  Renal/GU Renal disease     Musculoskeletal  (+) Arthritis ,   Abdominal   Peds  Hematology negative hematology ROS (+) anemia ,   Anesthesia Other Findings   Reproductive/Obstetrics negative OB ROS                             Anesthesia Physical Anesthesia Plan  ASA: III  Anesthesia Plan: General   Post-op Pain Management:    Induction: Intravenous  PONV Risk Score and Plan: 4 or greater and Ondansetron, Dexamethasone and Treatment may vary due to age or medical condition  Airway Management Planned: Oral ETT  Additional Equipment:   Intra-op Plan:   Post-operative Plan: Extubation in OR  Informed Consent: I have reviewed the patients History and Physical, chart, labs and discussed the procedure including the risks, benefits and alternatives for the proposed anesthesia with the patient or authorized representative who has indicated his/her understanding and acceptance.   Dental advisory given  Plan Discussed with: CRNA  Anesthesia Plan Comments:         Anesthesia Quick Evaluation

## 2016-10-19 NOTE — Progress Notes (Signed)
The patient was seen and examined at bedside. She was admitted this morning.See H&P for detail.  83 year old female with history of dementia had a fall at home associated with left hip fracture.Unable to obtain history from the patient. Discussed with the patient's daughter at bedside. Orthopedics was already consulted and patient is currently nothing by mouth for possible surgical intervention.  -start IV fluid and Pepcid while on nothing by mouth.  As per patient's daughter patient has no medical condition except dementia. No cardiac history.  DVT prophylaxis as per orthopedics. Discussed with the patient's nurse at bedside.

## 2016-10-19 NOTE — ED Notes (Signed)
Consulting MD at bedside (Hospitalist). 

## 2016-10-19 NOTE — Consult Note (Signed)
Reason for Consult:left hip pain Referring Physician: Dr Hart Robinsons is an 83 y.o. female.  HPI: Yolanda Shaw is an ambulatory 83 year old female who sustained a mechanical fall yesterday.  She sustained an intertrochanteric fracture of the left hip.  She has had previous rami fractures on both sides.  The fracture on the right did not heal.  She denies any other orthopedic complaints other than some neck pain. She does have dementia but lives in a house with her son.  He is her caregiver  patient had CT scan of head and neck in the emergency room which showed no acute injury  Past Medical History:  Diagnosis Date  . AKI (acute kidney injury) ([place])   . Bilateral pubic rami fractures ([place]) 06/29/2014  . Closed fracture of right proximal humerus   . Closed stable fracture of multiple pubic rami ([place]) 02/17/2016  . Dementia   . Dementia with behavioral disturbance   . Essential hypertension   . Fracture of left pubis ([place])   . Ischium fracture ([place])   . Pelvic fracture ([place]) 06/30/2014  . Pressure ulcer 06/30/2014  . Thrombocytopenia ([place]) 06/30/2014  . Tremor 06/30/2014    Past Surgical History:  Procedure Laterality Date  . ADENOIDECTOMY    . SHOULDER SURGERY     right  . TONSILLECTOMY    . WRIST SURGERY      Family History  Problem Relation Age of Onset  . Hypertension Other     Social History:  reports that she quit smoking about 69 years ago. She has never used smokeless tobacco. She reports that she drinks alcohol. She reports that she does not use drugs.  Allergies:  Allergies  Allergen Reactions  . Ether Other (See Comments)    Hard to wake up after procedure  . Amitriptyline Rash  . Penicillins Rash    Has patient had a PCN reaction causing immediate rash, facial/tongue/throat swelling, SOB or lightheadedness with hypotension: No Has patient had a PCN reaction causing severe rash involving mucus membranes or skin necrosis: No Has patient had a PCN reaction that  required hospitalization No Has patient had a PCN reaction occurring within the last 10 years: No If all of the above answers are "NO", then may proceed with Cephalosporin use.    Medications: I have reviewed the patient's current medications.  Results for orders placed or performed during the hospital encounter of 10/18/16 (from the past 48 hour(s))  ABO/Rh     Status: None   Collection Time: 10/19/16 12:30 AM  Result Value Ref Range   ABO/RH(D) AB POS   Basic metabolic panel     Status: Abnormal   Collection Time: 10/19/16 12:36 AM  Result Value Ref Range   Sodium 141 135 - 145 mmol/L   Potassium 4.4 3.5 - 5.1 mmol/L   Chloride 110 101 - 111 mmol/L   CO2 21 (L) 22 - 32 mmol/L   Glucose, Bld 167 (H) 65 - 99 mg/dL   BUN 24 (H) 6 - 20 mg/dL   Creatinine, Ser 1.00 0.44 - 1.00 mg/dL   Calcium 8.9 8.9 - 10.3 mg/dL   GFR calc non Af Amer 51 (L) >60 mL/min   GFR calc Af Amer 59 (L) >60 mL/min    Comment: (NOTE) The eGFR has been calculated using the CKD EPI equation. This calculation has not been validated in all clinical situations. eGFR's persistently <60 mL/min signify possible Chronic Kidney Disease.    Anion gap 10 5 - 15  CBC with Differential     Status: Abnormal   Collection Time: 10/19/16 12:36 AM  Result Value Ref Range   WBC 16.7 (H) 4.0 - 10.5 K/uL   RBC 4.58 3.87 - 5.11 MIL/uL   Hemoglobin 14.4 12.0 - 15.0 g/dL   HCT 42.9 36.0 - 46.0 %   MCV 93.7 78.0 - 100.0 fL   MCH 31.4 26.0 - 34.0 pg   MCHC 33.6 30.0 - 36.0 g/dL   RDW 14.5 11.5 - 15.5 %   Platelets 135 (L) 150 - 400 K/uL   Neutrophils Relative % 89 %   Neutro Abs 14.9 (H) 1.7 - 7.7 K/uL   Lymphocytes Relative 4 %   Lymphs Abs 0.6 (L) 0.7 - 4.0 K/uL   Monocytes Relative 7 %   Monocytes Absolute 1.2 (H) 0.1 - 1.0 K/uL   Eosinophils Relative 0 %   Eosinophils Absolute 0.0 0.0 - 0.7 K/uL   Basophils Relative 0 %   Basophils Absolute 0.0 0.0 - 0.1 K/uL  Type and screen [name]      Status: None   Collection Time: 10/19/16 12:36 AM  Result Value Ref Range   ABO/RH(D) AB POS    Antibody Screen NEG    Sample Expiration 10/22/2016   Protime-INR     Status: None   Collection Time: 10/19/16 12:36 AM  Result Value Ref Range   Prothrombin Time 13.8 11.4 - 15.2 seconds   INR 1.07   CBC     Status: Abnormal   Collection Time: 10/19/16  4:20 AM  Result Value Ref Range   WBC 15.7 (H) 4.0 - 10.5 K/uL   RBC 4.35 3.87 - 5.11 MIL/uL   Hemoglobin 13.3 12.0 - 15.0 g/dL   HCT 41.1 36.0 - 46.0 %   MCV 94.5 78.0 - 100.0 fL   MCH 30.6 26.0 - 34.0 pg   MCHC 32.4 30.0 - 36.0 g/dL   RDW 14.6 11.5 - 15.5 %   Platelets 123 (L) 150 - 400 K/uL  Basic metabolic panel     Status: Abnormal   Collection Time: 10/19/16  4:20 AM  Result Value Ref Range   Sodium 141 135 - 145 mmol/L   Potassium 4.6 3.5 - 5.1 mmol/L   Chloride 111 101 - 111 mmol/L   CO2 21 (L) 22 - 32 mmol/L   Glucose, Bld 171 (H) 65 - 99 mg/dL   BUN 27 (H) 6 - 20 mg/dL   Creatinine, Ser 1.10 (H) 0.44 - 1.00 mg/dL   Calcium 8.8 (L) 8.9 - 10.3 mg/dL   GFR calc non Af Amer 45 (L) >60 mL/min   GFR calc Af Amer 52 (L) >60 mL/min    Comment: (NOTE) The eGFR has been calculated using the CKD EPI equation. This calculation has not been validated in all clinical situations. eGFR's persistently <60 mL/min signify possible Chronic Kidney Disease.    Anion gap 9 5 - 15  Hemoglobin A1c     Status: Abnormal   Collection Time: 10/19/16  7:24 AM  Result Value Ref Range   Hgb A1c MFr Bld 5.7 (H) 4.8 - 5.6 %    Comment: (NOTE) Pre diabetes:          5.7%-6.4% Diabetes:              >6.4% Glycemic control for   <7.0% adults with diabetes    Mean Plasma Glucose 116.89 mg/dL    Dg Chest 1 View  Result Date: 10/18/2016  CLINICAL DATA:  Left hip fracture EXAM: CHEST 1 VIEW COMPARISON:  01/03/2013 FINDINGS: Shallow inspiration. No airspace consolidation. No pneumothorax or significant effusion. Moderate aortic tortuosity,  unchanged allowing for differences in projection. Normal pulmonary vasculature. IMPRESSION: No acute cardiopulmonary findings. Electronically Signed   By: Andreas Newport M.D.   On: 10/18/2016 23:26   Ct Head Wo Contrast  Result Date: 10/19/2016 CLINICAL DATA:  Fall.  Head and neck pain. EXAM: CT HEAD WITHOUT CONTRAST CT CERVICAL SPINE WITHOUT CONTRAST TECHNIQUE: Multidetector CT imaging of the head and cervical spine was performed following the standard protocol without intravenous contrast. Multiplanar CT image reconstructions of the cervical spine were also generated. COMPARISON:  02/17/2016 FINDINGS: CT HEAD FINDINGS Brain: No evidence of acute infarction, hemorrhage, hydrocephalus, extra-axial collection or mass lesion/mass effect. Diffuse cerebral atrophy. Ventricular dilatation consistent with central atrophy. Low-attenuation changes in the deep white matter consistent with small vessel ischemia. Vascular: No hyperdense vessel or unexpected calcification. Skull: Calvarium appears intact. Sinuses/Orbits: Visualized paranasal sinuses and mastoid air cells are not opacified. Other: Small subcutaneous scalp lesion over the right anterior frontal region may represent a small hematoma or sebaceous cyst. CT CERVICAL SPINE FINDINGS Alignment: Normal alignment of the cervical spine. C1-2 articulation appears intact. Skull base and vertebrae: There is anterior compression of the T6 vertebral body. No prior comparison studies of this area are available for correlation but other old compression deformities are present in this likely represents chronic compression. No definite evidence of any acute cortical irregularities. Mild chronic compression of the superior endplate of T1. No cervical compression deformities identified. No destructive bone lesions. Soft tissues and spinal canal: No prevertebral soft tissue swelling. No paraspinal mass or soft tissue infiltration. Disc levels: Mild degenerative changes  diffusely throughout the cervical spine with narrowed interspaces and endplate hypertrophic changes. Prominent degenerative changes at C1-2. Upper chest: Emphysematous changes in the lung apices. Thyroid gland appears mildly enlarged, likely representing thyroid goiter. No change since previous study. Other: None. IMPRESSION: 1. No acute intracranial abnormalities. Chronic atrophy and small vessel ischemic changes. 2. Normal alignment of the cervical spine. Degenerative changes. No acute displaced cervical spine fractures identified. 3. Compression deformities at T1 and T6 are likely old. Electronically Signed   By: Lucienne Capers M.D.   On: 10/19/2016 02:33   Ct Cervical Spine Wo Contrast  Result Date: 10/19/2016 CLINICAL DATA:  Fall.  Head and neck pain. EXAM: CT HEAD WITHOUT CONTRAST CT CERVICAL SPINE WITHOUT CONTRAST TECHNIQUE: Multidetector CT imaging of the head and cervical spine was performed following the standard protocol without intravenous contrast. Multiplanar CT image reconstructions of the cervical spine were also generated. COMPARISON:  02/17/2016 FINDINGS: CT HEAD FINDINGS Brain: No evidence of acute infarction, hemorrhage, hydrocephalus, extra-axial collection or mass lesion/mass effect. Diffuse cerebral atrophy. Ventricular dilatation consistent with central atrophy. Low-attenuation changes in the deep white matter consistent with small vessel ischemia. Vascular: No hyperdense vessel or unexpected calcification. Skull: Calvarium appears intact. Sinuses/Orbits: Visualized paranasal sinuses and mastoid air cells are not opacified. Other: Small subcutaneous scalp lesion over the right anterior frontal region may represent a small hematoma or sebaceous cyst. CT CERVICAL SPINE FINDINGS Alignment: Normal alignment of the cervical spine. C1-2 articulation appears intact. Skull base and vertebrae: There is anterior compression of the T6 vertebral body. No prior comparison studies of this area are  available for correlation but other old compression deformities are present in this likely represents chronic compression. No definite evidence of any acute cortical irregularities. Mild chronic compression  of the superior endplate of T1. No cervical compression deformities identified. No destructive bone lesions. Soft tissues and spinal canal: No prevertebral soft tissue swelling. No paraspinal mass or soft tissue infiltration. Disc levels: Mild degenerative changes diffusely throughout the cervical spine with narrowed interspaces and endplate hypertrophic changes. Prominent degenerative changes at C1-2. Upper chest: Emphysematous changes in the lung apices. Thyroid gland appears mildly enlarged, likely representing thyroid goiter. No change since previous study. Other: None. IMPRESSION: 1. No acute intracranial abnormalities. Chronic atrophy and small vessel ischemic changes. 2. Normal alignment of the cervical spine. Degenerative changes. No acute displaced cervical spine fractures identified. 3. Compression deformities at T1 and T6 are likely old. Electronically Signed   By: Lucienne Capers M.D.   On: 10/19/2016 02:33   Dg Shoulder Left  Result Date: 10/19/2016 CLINICAL DATA:  Initial evaluation for acute trauma, fall, left shoulder pain. EXAM: LEFT SHOULDER - 2+ VIEW COMPARISON:  None. FINDINGS: Bones are diffusely osteopenic. No acute fracture or dislocation. Humeral head in normal line with the glenoid. AC joint approximated. Dystrophic soft tissue calcification noted inferior to the humeral head. No acute soft tissue abnormality. Visualized left hemithorax grossly clear. IMPRESSION: 1. No acute osseous abnormality about the left shoulder. 2. Osteopenia. Electronically Signed   By: Jeannine Boga M.D.   On: 10/19/2016 00:30   Dg Hip Unilat With Pelvis 2-3 Views Left  Result Date: 10/18/2016 CLINICAL DATA:  [place] this evening. EXAM: DG HIP (WITH OR WITHOUT PELVIS) 2-3V LEFT COMPARISON:  None.  FINDINGS: There is an intertrochanteric left hip fracture with varus angulation. No dislocation. No radiographic findings to suggest a pathologic basis for the fracture. Healed pubic ramus fractures are present bilaterally. IMPRESSION: Intertrochanteric left hip fracture. Electronically Signed   By: Andreas Newport M.D.   On: 10/18/2016 23:25    Review of Systems  Unable to perform ROS: Dementia  Musculoskeletal: Positive for joint pain.   Blood pressure (!) 155/98, pulse 92, temperature 98.6 F (37 C), temperature source Oral, resp. rate 16, SpO2 90 %. Physical Exam  Constitutional: She appears well-developed.  HENT:  Head: Normocephalic.  Eyes: Pupils are equal, round, and reactive to light.  Neck: No tracheal deviation present.  Cardiovascular: Normal rate.   Respiratory: Effort normal.  Neurological: She is alert.  Skin: Skin is warm.  Psychiatric: She has a normal mood and affect.  xamination of the left leg demonstrates some shortening of the left extremity compared to the right.  Pedal pulses intact bilaterally with intact ankle dorsiflexion and plantar flexion bilaterally.  No knee effusion on either side.  No groin pain with internal/external rotation of the right side.  Bilateral upper extremity wrist elbow shoulder range of motion is intact.  Assessment/Plan: Impression is left intertrochanteric hip fracture.  Plan is intramedullary hip screw placement to be done tonight.  Risks and benefits are discussed including not limited to infection or vessel damage nonunion malunion as well as a period of prolonged partial to nonweightbearing depending on bone quality.  Patient's daughter voices understanding.  All questions answered.  Landry Dyke Naoki Migliaccio 10/19/2016, 12:07 PM

## 2016-10-19 NOTE — Brief Op Note (Signed)
10/18/2016 - 10/19/2016  10:16 PM  PATIENT:  Yolanda Shaw  83 y.o. female  PRE-OPERATIVE DIAGNOSIS:  left hip fracture  POST-OPERATIVE DIAGNOSIS:  Left Hip fracture  PROCEDURE:  Procedure(s): INTRAMEDULLARY (IM) NAIL INTERTROCHANTRIC  SURGEON:  Surgeon(s): Cammy Copa, MD  ASSISTANT: none  ANESTHESIA:   general  EBL: 150 ml    Total I/O In: 700 [I.V.:700] Out: 300 [Blood:300]  BLOOD ADMINISTERED: none  DRAINS: none   LOCAL MEDICATIONS USED:  none  SPECIMEN:  No Specimen  COUNTS:  YES  TOURNIQUET:  * No tourniquets in log *  DICTATION: .Other Dictation: Dictation Number [id number]  PLAN OF CARE: Admit to inpatient   PATIENT DISPOSITION:  PACU - hemodynamically stable

## 2016-10-19 NOTE — Clinical Social Work Note (Signed)
Clinical Social Work Assessment  Patient Details  Name: Yolanda Shaw MRN: [id number] Date of Birth: 04/04/1931  Date of referral:  10/19/16               Shaw for consult:  Facility Placement                Permission sought to share information with:  Family Supports Permission granted to share information::  Yes, Verbal Permission Granted  Name::     Yolanda Shaw/ Yolanda Shaw  (both are pt's sons)  Agency::     Relationship::     Contact Information:     Housing/Transportation Living arrangements for the past 2 months:  Single Family Home (with son  ) Source of Information:  Patient Patient Interpreter Needed:  None Criminal Activity/Legal Involvement Pertinent to Current Situation/Hospitalization:  No - Comment as needed Significant Relationships:  Adult Children Lives with:  Adult Children Yolanda Shaw) Do you feel safe going back to the place where you live?  Yes Need for family participation in patient care:  Yes (Comment)  Care giving concerns:  CSW spoke with pt's son Yolanda Shaw) for assessment being that pt has dementia. At this time pt's family has no concerns about care.    Social Worker assessment / plan:  CSW spoke with Yolanda Shaw via phone to complete assessment. CSW attempted to speak with pt, however pt has dementia and is unsure of answers to questions. CSW was informed by Yolanda Shaw that pt does live with son Yolanda Shaw and has lived with him for years. Pt has support from both sons and daughter. At this time family is open to [facility] placement after surgery if recommended by PT.   Employment status:  Retired Health and safety inspector:  Medicare PT Recommendations:  Not assessed at this time Information / Referral to community resources:  Skilled Nursing Facility  Patient/Family's Response to care:  Pt's son is understanding and agreeable to plan of care at this time.   Patient/Family's Understanding of and Emotional Response to Diagnosis, Current Treatment, and Prognosis: No further questions or  concerns have been presented to CSW at this time.   Emotional Assessment Appearance:  Appears stated age Attitude/Demeanor/Rapport:    Affect (typically observed):  Pleasant Orientation:   (pt has dementia per note. ) Alcohol / Substance use:  Alcohol Use Psych involvement (Current and /or in the community):  No (Comment)  Discharge Needs  Concerns to be addressed:  No discharge needs identified Readmission within the last 30 days:  No Current discharge risk:  None Barriers to Discharge:  No Barriers Identified   Yolanda Shaw, LCSWA 10/19/2016, 9:08 AM

## 2016-10-19 NOTE — Progress Notes (Signed)
CSW reached out to pt's son Nida Boatman for collateral information on pt. Pt has dementia per notes, therefore CSW will speak with pt son's when avaible. CSW was unable to speak with pt's son and was unable to leave a VM at this time. CSW will continue to assists with needs as presented.    Claude Manges Marvion Bastidas, MSW, LCSW-A Emergency Department Clinical Social Worker [phone]

## 2016-10-19 NOTE — H&P (Signed)
History and Physical    Yolanda Shaw XBJ:[id number] DOB: 1931-02-02 DOA: 10/18/2016  PCP: Georgann Housekeeper, MD  Patient coming from: Home.  Chief Complaint: Fall.  HPI: Yolanda Shaw is a 83 y.o. female with history of dementia who was brought to the ER after patient had a fall at home. It was reported that patient had taken her Xanax and try to walk and she fell. Not sure if she had loss consciousness. Since patient had persistent pain in the left hip patient was brought to the ER.Patient lives with her son.   ED Course: in the ER CT of the head and neck were unremarkable.X-rays revealed  Left hip fracture. Patient is being admitted for further management. On my exam patient appears confused and has dementia.  Review of Systems: As per HPI, rest all negative.   Past Medical History:  Diagnosis Date  . AKI (acute kidney injury) (HCC)   . Bilateral pubic rami fractures (HCC) 06/29/2014  . Closed fracture of right proximal humerus   . Closed stable fracture of multiple pubic rami (HCC) 02/17/2016  . Dementia   . Dementia with behavioral disturbance   . Essential hypertension   . Fracture of left pubis (HCC)   . Ischium fracture (HCC)   . Pelvic fracture (HCC) 06/30/2014  . Pressure ulcer 06/30/2014  . Thrombocytopenia (HCC) 06/30/2014  . Tremor 06/30/2014    Past Surgical History:  Procedure Laterality Date  . ADENOIDECTOMY    . SHOULDER SURGERY     right  . TONSILLECTOMY    . WRIST SURGERY       reports that she quit smoking about 69 years ago. She has never used smokeless tobacco. She reports that she drinks alcohol. She reports that she does not use drugs.  Allergies  Allergen Reactions  . Ether Other (See Comments)    Hard to wake up after procedure  . Amitriptyline Rash  . Penicillins Rash    Has patient had a PCN reaction causing immediate rash, facial/tongue/throat swelling, SOB or lightheadedness with hypotension: No Has patient had a PCN reaction causing severe rash  involving mucus membranes or skin necrosis: No Has patient had a PCN reaction that required hospitalization No Has patient had a PCN reaction occurring within the last 10 years: No If all of the above answers are "NO", then may proceed with Cephalosporin use.    Family History  Problem Relation Age of Onset  . Hypertension Other     Prior to Admission medications   Medication Sig Start Date End Date Taking? Authorizing Provider  acetaminophen (TYLENOL) 325 MG tablet Take 650 mg by mouth 2 (two) times daily as needed (for temp greater than 99.5 or every 8 hours as needed for pain).   Yes [provider]  ALPRAZolam (XANAX) 0.25 MG tablet Take 0.25 mg by mouth 2 (two) times daily as needed for anxiety.   Yes [provider]  haloperidol lactate (HALDOL) 5 MG/ML injection Inject 5 mg into the muscle every 6 (six) hours as needed (behavioral problems).    Yes [provider]  HYDROcodone-acetaminophen (NORCO/VICODIN) 5-325 MG tablet Take 1 tablet by mouth every 6 (six) hours as needed for moderate pain.  02/25/16  Yes [provider]  memantine (NAMENDA) 10 MG tablet Take 10 mg by mouth 2 (two) times daily.   Yes [provider]  senna (SENOKOT) 8.6 MG TABS tablet Take 1 tablet (8.6 mg total) by mouth daily. Patient taking differently: Take 1 tablet by  mouth daily as needed for mild constipation.  02/19/16  Yes Cleora Fleet, MD    Physical Exam: Vitals:   10/18/16 2239 10/19/16 0115 10/19/16 0200 10/19/16 0252  BP: (!) 179/88 107/69 (!) 154/93 107/74  Pulse: 78 83  86  Resp: Temp: 98.6 F (37 C)     TempSrc: Oral     SpO2: 97% 93%  95%      Constitutional: moderately built and nourished. Vitals:   10/18/16 2239 10/19/16 0115 10/19/16 0200 10/19/16 0252  BP: (!) 179/88 107/69 (!) 154/93 107/74  Pulse: 78 83  86  Resp: Temp: 98.6 F (37 C)     TempSrc: Oral     SpO2: 97% 93%  95%   Eyes: anicteric no  pallor. ENMT: no discharge from the ears eyes nose or more. Neck: no mass felt. No JVD appreciated. Respiratory: no rhonchi or crepitations. Cardiovascular: S1-S2 heard no murmurs appreciated. Abdomen: soft nontender bowel sounds present. Musculoskeletal: pain on moving her left hip. Skin: no rash. Neurologic:alert awake oriented to her name. Follows commands and moves all extremities. Psychiatric: advanced dementia.   Labs on Admission: I have personally reviewed following labs and imaging studies  CBC:  Recent Labs Lab 10/19/16 0036  WBC 16.7*  NEUTROABS 14.9*  HGB 14.4  HCT 42.9  MCV 93.7  PLT 135*   Basic Metabolic Panel:  Recent Labs Lab 10/19/16 0036  NA 141  K 4.4  CL 110  CO2 21*  GLUCOSE 167*  BUN 24*  CREATININE 1.00  CALCIUM 8.9   GFR: CrCl cannot be calculated (Unknown ideal weight.). Liver Function Tests: No results for input(s): AST, ALT, ALKPHOS, BILITOT, PROT, ALBUMIN in the last 168 hours. No results for input(s): LIPASE, AMYLASE in the last 168 hours. No results for input(s): AMMONIA in the last 168 hours. Coagulation Profile:  Recent Labs Lab 10/19/16 0036  INR 1.07   Cardiac Enzymes: No results for input(s): CKTOTAL, CKMB, CKMBINDEX, TROPONINI in the last 168 hours. BNP (last 3 results) No results for input(s): PROBNP in the last 8760 hours. HbA1C: No results for input(s): HGBA1C in the last 72 hours. CBG: No results for input(s): GLUCAP in the last 168 hours. Lipid Profile: No results for input(s): CHOL, HDL, LDLCALC, TRIG, CHOLHDL, LDLDIRECT in the last 72 hours. Thyroid Function Tests: No results for input(s): TSH, T4TOTAL, FREET4, T3FREE, THYROIDAB in the last 72 hours. Anemia Panel: No results for input(s): VITAMINB12, FOLATE, FERRITIN, TIBC, IRON, RETICCTPCT in the last 72 hours. Urine analysis:    Component Value Date/Time   COLORURINE YELLOW 06/22/2016 2327   APPEARANCEUR CLEAR 06/22/2016 2327   LABSPEC 1.027  06/22/2016 2327   PHURINE 5.0 06/22/2016 2327   GLUCOSEU NEGATIVE 06/22/2016 2327   HGBUR NEGATIVE 06/22/2016 2327   BILIRUBINUR NEGATIVE 06/22/2016 2327   KETONESUR NEGATIVE 06/22/2016 2327   PROTEINUR NEGATIVE 06/22/2016 2327   UROBILINOGEN 0.2 01/03/2013 1701   NITRITE NEGATIVE 06/22/2016 2327   LEUKOCYTESUR TRACE (A) 06/22/2016 2327   Sepsis Labs: (procalcitonin:4,lacticidven:4) )No results found for this or any previous visit (from the past 240 hour(s)).   Radiological Exams on Admission: Dg Chest 1 View  Result Date: 10/18/2016 CLINICAL DATA:  Left hip fracture EXAM: CHEST 1 VIEW COMPARISON:  01/03/2013 FINDINGS: Shallow inspiration. No airspace consolidation. No pneumothorax or significant effusion. Moderate aortic tortuosity, unchanged allowing for differences in projection. Normal pulmonary vasculature. IMPRESSION: No acute cardiopulmonary findings. Electronically Signed   By:  Ellery Plunk M.D.   On: 10/18/2016 23:26   Ct Head Wo Contrast  Result Date: 10/19/2016 CLINICAL DATA:  Fall.  Head and neck pain. EXAM: CT HEAD WITHOUT CONTRAST CT CERVICAL SPINE WITHOUT CONTRAST TECHNIQUE: Multidetector CT imaging of the head and cervical spine was performed following the standard protocol without intravenous contrast. Multiplanar CT image reconstructions of the cervical spine were also generated. COMPARISON:  02/17/2016 FINDINGS: CT HEAD FINDINGS Brain: No evidence of acute infarction, hemorrhage, hydrocephalus, extra-axial collection or mass lesion/mass effect. Diffuse cerebral atrophy. Ventricular dilatation consistent with central atrophy. Low-attenuation changes in the deep white matter consistent with small vessel ischemia. Vascular: No hyperdense vessel or unexpected calcification. Skull: Calvarium appears intact. Sinuses/Orbits: Visualized paranasal sinuses and mastoid air cells are not opacified. Other: Small subcutaneous scalp lesion over the right anterior frontal  region may represent a small hematoma or sebaceous cyst. CT CERVICAL SPINE FINDINGS Alignment: Normal alignment of the cervical spine. C1-2 articulation appears intact. Skull base and vertebrae: There is anterior compression of the T6 vertebral body. No prior comparison studies of this area are available for correlation but other old compression deformities are present in this likely represents chronic compression. No definite evidence of any acute cortical irregularities. Mild chronic compression of the superior endplate of T1. No cervical compression deformities identified. No destructive bone lesions. Soft tissues and spinal canal: No prevertebral soft tissue swelling. No paraspinal mass or soft tissue infiltration. Disc levels: Mild degenerative changes diffusely throughout the cervical spine with narrowed interspaces and endplate hypertrophic changes. Prominent degenerative changes at C1-2. Upper chest: Emphysematous changes in the lung apices. Thyroid gland appears mildly enlarged, likely representing thyroid goiter. No change since previous study. Other: None. IMPRESSION: 1. No acute intracranial abnormalities. Chronic atrophy and small vessel ischemic changes. 2. Normal alignment of the cervical spine. Degenerative changes. No acute displaced cervical spine fractures identified. 3. Compression deformities at T1 and T6 are likely old. Electronically Signed   By: Burman Nieves M.D.   On: 10/19/2016 02:33   Ct Cervical Spine Wo Contrast  Result Date: 10/19/2016 CLINICAL DATA:  Fall.  Head and neck pain. EXAM: CT HEAD WITHOUT CONTRAST CT CERVICAL SPINE WITHOUT CONTRAST TECHNIQUE: Multidetector CT imaging of the head and cervical spine was performed following the standard protocol without intravenous contrast. Multiplanar CT image reconstructions of the cervical spine were also generated. COMPARISON:  02/17/2016 FINDINGS: CT HEAD FINDINGS Brain: No evidence of acute infarction, hemorrhage, hydrocephalus,  extra-axial collection or mass lesion/mass effect. Diffuse cerebral atrophy. Ventricular dilatation consistent with central atrophy. Low-attenuation changes in the deep white matter consistent with small vessel ischemia. Vascular: No hyperdense vessel or unexpected calcification. Skull: Calvarium appears intact. Sinuses/Orbits: Visualized paranasal sinuses and mastoid air cells are not opacified. Other: Small subcutaneous scalp lesion over the right anterior frontal region may represent a small hematoma or sebaceous cyst. CT CERVICAL SPINE FINDINGS Alignment: Normal alignment of the cervical spine. C1-2 articulation appears intact. Skull base and vertebrae: There is anterior compression of the T6 vertebral body. No prior comparison studies of this area are available for correlation but other old compression deformities are present in this likely represents chronic compression. No definite evidence of any acute cortical irregularities. Mild chronic compression of the superior endplate of T1. No cervical compression deformities identified. No destructive bone lesions. Soft tissues and spinal canal: No prevertebral soft tissue swelling. No paraspinal mass or soft tissue infiltration. Disc levels: Mild degenerative changes diffusely throughout the cervical spine with narrowed interspaces and endplate  hypertrophic changes. Prominent degenerative changes at C1-2. Upper chest: Emphysematous changes in the lung apices. Thyroid gland appears mildly enlarged, likely representing thyroid goiter. No change since previous study. Other: None. IMPRESSION: 1. No acute intracranial abnormalities. Chronic atrophy and small vessel ischemic changes. 2. Normal alignment of the cervical spine. Degenerative changes. No acute displaced cervical spine fractures identified. 3. Compression deformities at T1 and T6 are likely old. Electronically Signed   By: Burman Nieves M.D.   On: 10/19/2016 02:33   Dg Shoulder Left  Result Date:  10/19/2016 CLINICAL DATA:  Initial evaluation for acute trauma, fall, left shoulder pain. EXAM: LEFT SHOULDER - 2+ VIEW COMPARISON:  None. FINDINGS: Bones are diffusely osteopenic. No acute fracture or dislocation. Humeral head in normal line with the glenoid. AC joint approximated. Dystrophic soft tissue calcification noted inferior to the humeral head. No acute soft tissue abnormality. Visualized left hemithorax grossly clear. IMPRESSION: 1. No acute osseous abnormality about the left shoulder. 2. Osteopenia. Electronically Signed   By: Rise Mu M.D.   On: 10/19/2016 00:30   Dg Hip Unilat With Pelvis 2-3 Views Left  Result Date: 10/18/2016 CLINICAL DATA:  Larey Seat this evening. EXAM: DG HIP (WITH OR WITHOUT PELVIS) 2-3V LEFT COMPARISON:  None. FINDINGS: There is an intertrochanteric left hip fracture with varus angulation. No dislocation. No radiographic findings to suggest a pathologic basis for the fracture. Healed pubic ramus fractures are present bilaterally. IMPRESSION: Intertrochanteric left hip fracture. Electronically Signed   By: Ellery Plunk M.D.   On: 10/18/2016 23:25    EKG: Independently reviewed. Normal sinus rhythm.  Assessment/Plan Principal Problem:   Closed left hip fracture, initial encounter (HCC) Active Problems:   Dementia with behavioral disturbance   Hip fracture (HCC)    1. Left hip fracture status post fall - at this time patient appears to be hemodynamically stable. Patient is at moderate risk for intermediate risk procedure. Patient will be kept nothing by mouth in anticipation of surgery. Dr. August Saucer was consulted orthopedics. 2. Elevated blood pressure - may be related to pain. Closely follow blood pressure trends. 3. Hyperglycemia - check hemoglobin A1c. 4. Dementia - no acute issues. 5. Recently admitted in February for fall and pelvic fracture.  I have reviewed patient's old charts and labs.   DVT prophylaxis: SCDs in anticipation of  surgery. Code Status: full code. Need to confirm with patient's family in  Morning unable to reach them now.  Family Communication: no family at the bedside. Unable to reach.  Disposition Plan: to be determined.  Consults called: orthopedics.  Admission status: inpatient.    Eduard Clos MD [facility] Pager [phone].  If 7PM-7AM, please contact night-coverage [email or web address] Password [email or web address]  10/19/2016, 3:33 AM

## 2016-10-19 NOTE — ED Provider Notes (Signed)
Assumed care from PA Dansie at shift change.  See his note for full H&P.  Briefly, 83 y.o F here after fall at home.  She got out of bed after taking a xanax and fell onto the floor.  Questionable LOC.  X-rays thus far with left hip fracture.  Patient has seen [facility] in the past, Dr. Rayburn Ma.  Plan:  Labs and CT head/neck pending.  Admission after these.  Results for orders placed or performed during the hospital encounter of 10/18/16  Basic metabolic panel  Result Value Ref Range   Sodium 141 135 - 145 mmol/L   Potassium 4.4 3.5 - 5.1 mmol/L   Chloride 110 101 - 111 mmol/L   CO2 21 (L) 22 - 32 mmol/L   Glucose, Bld 167 (H) 65 - 99 mg/dL   BUN 24 (H) 6 - 20 mg/dL   Creatinine, Ser 1.61 0.44 - 1.00 mg/dL   Calcium 8.9 8.9 - 09.6 mg/dL   GFR calc non Af Amer 51 (L) >60 mL/min   GFR calc Af Amer 59 (L) >60 mL/min   Anion gap 10 5 - 15  CBC with Differential  Result Value Ref Range   WBC 16.7 (H) 4.0 - 10.5 K/uL   RBC 4.58 3.87 - 5.11 MIL/uL   Hemoglobin 14.4 12.0 - 15.0 g/dL   HCT 04.5 40.9 - 81.1 %   MCV 93.7 78.0 - 100.0 fL   MCH 31.4 26.0 - 34.0 pg   MCHC 33.6 30.0 - 36.0 g/dL   RDW 91.4 78.2 - 95.6 %   Platelets 135 (L) 150 - 400 K/uL   Neutrophils Relative % 89 %   Neutro Abs 14.9 (H) 1.7 - 7.7 K/uL   Lymphocytes Relative 4 %   Lymphs Abs 0.6 (L) 0.7 - 4.0 K/uL   Monocytes Relative 7 %   Monocytes Absolute 1.2 (H) 0.1 - 1.0 K/uL   Eosinophils Relative 0 %   Eosinophils Absolute 0.0 0.0 - 0.7 K/uL   Basophils Relative 0 %   Basophils Absolute 0.0 0.0 - 0.1 K/uL  Protime-INR  Result Value Ref Range   Prothrombin Time 13.8 11.4 - 15.2 seconds   INR 1.07   Type and screen [facility]  Result Value Ref Range   ABO/RH(D) AB POS    Antibody Screen NEG    Sample Expiration 10/22/2016   ABO/Rh  Result Value Ref Range   ABO/RH(D) AB POS    Dg Chest 1 View  Result Date: 10/18/2016 CLINICAL DATA:  Left hip fracture EXAM: CHEST 1 VIEW  COMPARISON:  01/03/2013 FINDINGS: Shallow inspiration. No airspace consolidation. No pneumothorax or significant effusion. Moderate aortic tortuosity, unchanged allowing for differences in projection. Normal pulmonary vasculature. IMPRESSION: No acute cardiopulmonary findings. Electronically Signed   By: Ellery Plunk M.D.   On: 10/18/2016 23:26   Ct Head Wo Contrast  Result Date: 10/19/2016 CLINICAL DATA:  Fall.  Head and neck pain. EXAM: CT HEAD WITHOUT CONTRAST CT CERVICAL SPINE WITHOUT CONTRAST TECHNIQUE: Multidetector CT imaging of the head and cervical spine was performed following the standard protocol without intravenous contrast. Multiplanar CT image reconstructions of the cervical spine were also generated. COMPARISON:  02/17/2016 FINDINGS: CT HEAD FINDINGS Brain: No evidence of acute infarction, hemorrhage, hydrocephalus, extra-axial collection or mass lesion/mass effect. Diffuse cerebral atrophy. Ventricular dilatation consistent with central atrophy. Low-attenuation changes in the deep white matter consistent with small vessel ischemia. Vascular: No hyperdense vessel or unexpected calcification. Skull: Calvarium appears intact. Sinuses/Orbits:  Visualized paranasal sinuses and mastoid air cells are not opacified. Other: Small subcutaneous scalp lesion over the right anterior frontal region may represent a small hematoma or sebaceous cyst. CT CERVICAL SPINE FINDINGS Alignment: Normal alignment of the cervical spine. C1-2 articulation appears intact. Skull base and vertebrae: There is anterior compression of the T6 vertebral body. No prior comparison studies of this area are available for correlation but other old compression deformities are present in this likely represents chronic compression. No definite evidence of any acute cortical irregularities. Mild chronic compression of the superior endplate of T1. No cervical compression deformities identified. No destructive bone lesions. Soft tissues  and spinal canal: No prevertebral soft tissue swelling. No paraspinal mass or soft tissue infiltration. Disc levels: Mild degenerative changes diffusely throughout the cervical spine with narrowed interspaces and endplate hypertrophic changes. Prominent degenerative changes at C1-2. Upper chest: Emphysematous changes in the lung apices. Thyroid gland appears mildly enlarged, likely representing thyroid goiter. No change since previous study. Other: None. IMPRESSION: 1. No acute intracranial abnormalities. Chronic atrophy and small vessel ischemic changes. 2. Normal alignment of the cervical spine. Degenerative changes. No acute displaced cervical spine fractures identified. 3. Compression deformities at T1 and T6 are likely old. Electronically Signed   By: Burman Nieves M.D.   On: 10/19/2016 02:33   Ct Cervical Spine Wo Contrast  Result Date: 10/19/2016 CLINICAL DATA:  Fall.  Head and neck pain. EXAM: CT HEAD WITHOUT CONTRAST CT CERVICAL SPINE WITHOUT CONTRAST TECHNIQUE: Multidetector CT imaging of the head and cervical spine was performed following the standard protocol without intravenous contrast. Multiplanar CT image reconstructions of the cervical spine were also generated. COMPARISON:  02/17/2016 FINDINGS: CT HEAD FINDINGS Brain: No evidence of acute infarction, hemorrhage, hydrocephalus, extra-axial collection or mass lesion/mass effect. Diffuse cerebral atrophy. Ventricular dilatation consistent with central atrophy. Low-attenuation changes in the deep white matter consistent with small vessel ischemia. Vascular: No hyperdense vessel or unexpected calcification. Skull: Calvarium appears intact. Sinuses/Orbits: Visualized paranasal sinuses and mastoid air cells are not opacified. Other: Small subcutaneous scalp lesion over the right anterior frontal region may represent a small hematoma or sebaceous cyst. CT CERVICAL SPINE FINDINGS Alignment: Normal alignment of the cervical spine. C1-2 articulation  appears intact. Skull base and vertebrae: There is anterior compression of the T6 vertebral body. No prior comparison studies of this area are available for correlation but other old compression deformities are present in this likely represents chronic compression. No definite evidence of any acute cortical irregularities. Mild chronic compression of the superior endplate of T1. No cervical compression deformities identified. No destructive bone lesions. Soft tissues and spinal canal: No prevertebral soft tissue swelling. No paraspinal mass or soft tissue infiltration. Disc levels: Mild degenerative changes diffusely throughout the cervical spine with narrowed interspaces and endplate hypertrophic changes. Prominent degenerative changes at C1-2. Upper chest: Emphysematous changes in the lung apices. Thyroid gland appears mildly enlarged, likely representing thyroid goiter. No change since previous study. Other: None. IMPRESSION: 1. No acute intracranial abnormalities. Chronic atrophy and small vessel ischemic changes. 2. Normal alignment of the cervical spine. Degenerative changes. No acute displaced cervical spine fractures identified. 3. Compression deformities at T1 and T6 are likely old. Electronically Signed   By: Burman Nieves M.D.   On: 10/19/2016 02:33   Dg Shoulder Left  Result Date: 10/19/2016 CLINICAL DATA:  Initial evaluation for acute trauma, fall, left shoulder pain. EXAM: LEFT SHOULDER - 2+ VIEW COMPARISON:  None. FINDINGS: Bones are diffusely osteopenic. No acute fracture  or dislocation. Humeral head in normal line with the glenoid. AC joint approximated. Dystrophic soft tissue calcification noted inferior to the humeral head. No acute soft tissue abnormality. Visualized left hemithorax grossly clear. IMPRESSION: 1. No acute osseous abnormality about the left shoulder. 2. Osteopenia. Electronically Signed   By: Rise Mu M.D.   On: 10/19/2016 00:30   Dg Hip Unilat With Pelvis 2-3  Views Left  Result Date: 10/18/2016 CLINICAL DATA:  Larey Seat this evening. EXAM: DG HIP (WITH OR WITHOUT PELVIS) 2-3V LEFT COMPARISON:  None. FINDINGS: There is an intertrochanteric left hip fracture with varus angulation. No dislocation. No radiographic findings to suggest a pathologic basis for the fracture. Healed pubic ramus fractures are present bilaterally. IMPRESSION: Intertrochanteric left hip fracture. Electronically Signed   By: Ellery Plunk M.D.   On: 10/18/2016 23:25   Remainder of imaging studies without acute injuries.  Labs overall reassuring.    Discussed with orthopedics, Dr. August Saucer, on call for [facility]-- will plan for repair today, unsure exactly when.  Wants her kept NPO if possible.  Discussed with hospitalist, Dr. Toniann Fail-- will admit for ongoing care.   Garlon Hatchet, PA-C 10/19/16 1610    Shon Baton, MD 10/20/16 [phone]

## 2016-10-20 ENCOUNTER — Encounter (HOSPITAL_COMMUNITY): Payer: Self-pay | Admitting: Orthopedic Surgery

## 2016-10-20 DIAGNOSIS — D62 Acute posthemorrhagic anemia: Secondary | ICD-10-CM

## 2016-10-20 LAB — CBC
HEMATOCRIT: 31.6 % — AB (ref 36.0–46.0)
HEMOGLOBIN: 10.4 g/dL — AB (ref 12.0–15.0)
MCH: 30.7 pg (ref 26.0–34.0)
MCHC: 32.9 g/dL (ref 30.0–36.0)
MCV: 93.2 fL (ref 78.0–100.0)
Platelets: 88 10*3/uL — ABNORMAL LOW (ref 150–400)
RBC: 3.39 MIL/uL — AB (ref 3.87–5.11)
RDW: 14.4 % (ref 11.5–15.5)
WBC: 10.7 10*3/uL — ABNORMAL HIGH (ref 4.0–10.5)

## 2016-10-20 LAB — BASIC METABOLIC PANEL
Anion gap: 8 (ref 5–15)
BUN: 27 mg/dL — ABNORMAL HIGH (ref 6–20)
CHLORIDE: 112 mmol/L — AB (ref 101–111)
CO2: 22 mmol/L (ref 22–32)
Calcium: 8.6 mg/dL — ABNORMAL LOW (ref 8.9–10.3)
Creatinine, Ser: 0.96 mg/dL (ref 0.44–1.00)
GFR calc non Af Amer: 53 mL/min — ABNORMAL LOW (ref >60)
Glucose, Bld: 164 mg/dL — ABNORMAL HIGH (ref 65–99)
POTASSIUM: 4.8 mmol/L (ref 3.5–5.1)
SODIUM: 142 mmol/L (ref 135–145)

## 2016-10-20 LAB — PROTIME-INR
INR: 1.2
PROTHROMBIN TIME: 15.2 s (ref 11.4–15.2)

## 2016-10-20 MED ORDER — FAMOTIDINE 20 MG PO TABS
20.0000 mg | ORAL_TABLET | Freq: Every day | ORAL | Status: DC
  Administered 2016-10-20 – 2016-10-22 (×3): 20 mg via ORAL
  Filled 2016-10-20 (×3): qty 1

## 2016-10-20 MED ORDER — PHENOL 1.4 % MT LIQD: 1.0000 | OROMUCOSAL | Status: DC | PRN

## 2016-10-20 MED ORDER — ACETAMINOPHEN 650 MG RE SUPP: 650.0000 mg | Freq: Four times a day (QID) | RECTAL | Status: DC | PRN

## 2016-10-20 MED ORDER — ONDANSETRON HCL 4 MG PO TABS: 4.0000 mg | ORAL_TABLET | Freq: Four times a day (QID) | ORAL | Status: DC | PRN

## 2016-10-20 MED ORDER — HYDROCODONE-ACETAMINOPHEN 5-325 MG PO TABS
1.0000 | ORAL_TABLET | Freq: Four times a day (QID) | ORAL | Status: DC | PRN
  Administered 2016-10-20: 1 via ORAL
  Filled 2016-10-20 (×2): qty 1

## 2016-10-20 MED ORDER — ACETAMINOPHEN 325 MG PO TABS
650.0000 mg | ORAL_TABLET | Freq: Four times a day (QID) | ORAL | Status: DC | PRN
  Administered 2016-10-20 – 2016-10-22 (×3): 650 mg via ORAL
  Filled 2016-10-20 (×3): qty 2

## 2016-10-20 MED ORDER — METOCLOPRAMIDE HCL 5 MG PO TABS: 5.0000 mg | ORAL_TABLET | Freq: Three times a day (TID) | ORAL | Status: DC | PRN

## 2016-10-20 MED ORDER — ONDANSETRON HCL 4 MG/2ML IJ SOLN: 4.0000 mg | Freq: Four times a day (QID) | INTRAMUSCULAR | Status: DC | PRN

## 2016-10-20 MED ORDER — CLINDAMYCIN PHOSPHATE 600 MG/50ML IV SOLN
600.0000 mg | Freq: Four times a day (QID) | INTRAVENOUS | Status: AC
  Administered 2016-10-20 (×2): 600 mg via INTRAVENOUS
  Filled 2016-10-20 (×2): qty 50

## 2016-10-20 MED ORDER — ASPIRIN EC 325 MG PO TBEC
325.0000 mg | DELAYED_RELEASE_TABLET | Freq: Every day | ORAL | Status: DC
  Administered 2016-10-20 – 2016-10-22 (×3): 325 mg via ORAL
  Filled 2016-10-20 (×4): qty 1

## 2016-10-20 MED ORDER — METOCLOPRAMIDE HCL 5 MG/ML IJ SOLN: 5.0000 mg | Freq: Three times a day (TID) | INTRAMUSCULAR | Status: DC | PRN

## 2016-10-20 MED ORDER — ENSURE ENLIVE PO LIQD
237.0000 mL | Freq: Every day | ORAL | Status: DC
  Administered 2016-10-20 – 2016-10-22 (×2): 237 mL via ORAL

## 2016-10-20 MED ORDER — MENTHOL 3 MG MT LOZG: 1.0000 | LOZENGE | OROMUCOSAL | Status: DC | PRN

## 2016-10-20 MED ORDER — POTASSIUM CHLORIDE IN NACL 20-0.9 MEQ/L-% IV SOLN
INTRAVENOUS | Status: DC
  Administered 2016-10-20 (×2): via INTRAVENOUS
  Filled 2016-10-20 (×2): qty 1000

## 2016-10-20 NOTE — Transfer of Care (Signed)
Immediate Anesthesia Transfer of Care Note  Patient: Yolanda Shaw  Procedure(s) Performed: INTRAMEDULLARY (IM) NAIL INTERTROCHANTRIC (Left Hip)  Patient Location: PACU  Anesthesia Type:General  Level of Consciousness: drowsy, patient cooperative and responds to stimulation  Airway & Oxygen Therapy: Patient Spontanous Breathing and Patient connected to nasal cannula oxygen  Post-op Assessment: Report given to RN and Post -op Vital signs reviewed and stable  Post vital signs: Reviewed and stable  Last Vitals:  Vitals:   10/20/16 0133 10/20/16 0513  BP: 129/76 (!) 150/89  Pulse: 92 (!) 103  Resp: 16 15  Temp: 36.5 C 36.5 C  SpO2: 97% 91%    Last Pain:  Vitals:   10/20/16 0513  TempSrc: Oral  PainSc:       Patients Stated Pain Goal: 1 (10/19/16 1910)  Complications: No apparent anesthesia complications

## 2016-10-20 NOTE — Progress Notes (Signed)
Subjective: Pt stable - pain better   Objective: Vital signs in last 24 hours: Temp:  [97.7 F (36.5 C)-97.8 F (36.6 C)] 97.7 F (36.5 C) (10/16 0513) Pulse Rate:  [86-103] 103 (10/16 0513) Resp:  [11-23] 15 (10/16 0513) BP: (129-150)/(65-89) 150/89 (10/16 0513) SpO2:  [91 %-100 %] 91 % (10/16 0513)  Intake/Output from previous day: 10/15 0701 - 10/16 0700 In: 700 [I.V.:700] Out: 300 [Blood:300] Intake/Output this shift: Total I/O In: 360 [P.O.:360] Out: -   Exam:  Dorsiflexion/Plantar flexion intact  Labs:  Recent Labs  10/19/16 0036 10/19/16 0420 10/20/16 0625  HGB 14.4 13.3 10.4*    Recent Labs  10/19/16 0420 10/20/16 0625  WBC 15.7* 10.7*  RBC 4.35 3.39*  HCT 41.1 31.6*  PLT 123* 88*    Recent Labs  10/19/16 0420 10/20/16 0625  NA 141 142  K 4.6 4.8  CL 111 112*  CO2 21* 22  BUN 27* 27*  CREATININE 1.10* 0.96  GLUCOSE 171* 164*  CALCIUM 8.8* 8.6*    Recent Labs  10/19/16 0036 10/20/16 0625  INR 1.07 1.20    Assessment/Plan: Plan for placement this week   G Scott Dean 10/20/2016, 12:52 PM

## 2016-10-20 NOTE — Evaluation (Signed)
Physical Therapy Evaluation Patient Details Name: Yolanda Shaw MRN: [id number] DOB: 08/04/1931 Today's Date: 10/20/2016   History of Present Illness   Yolanda Shaw is an ambulatory 83 year old female who sustained a mechanical fall yesterday.  She sustained an intertrochanteric fracture of the left hip. She is s/p IM hip screw.  PMH positive for dementia, bilat pubic rami fx 2016 & 2018, AKI.  Clinical Impression  Patient presents with decreased independence with mobility due to pain, weakness, decreased weight bearing and decreased activity tolerance.  She will benefit from skilled PT in the acute setting to allow return home following SNF level rehab stay.    Follow Up Recommendations SNF;Supervision/Assistance - 24 hour    Equipment Recommendations  Other (comment) (TBA)    Recommendations for Other Services       Precautions / Restrictions Precautions Precautions: Fall Restrictions Weight Bearing Restrictions: Yes LLE Weight Bearing: Non weight bearing      Mobility  Bed Mobility Overal bed mobility: Needs Assistance Bed Mobility: Supine to Sit     Supine to sit: Max assist;+2 for physical assistance     General bed mobility comments: assist for moving legs, hips and lifting trunk  Transfers Overall transfer level: Needs assistance   Transfers: Stand Pivot Transfers   Stand pivot transfers: +2 physical assistance;+2 safety/equipment;Max assist       General transfer comment: max cues for NWB, but unable to maintain, pivot to recliner with lift pad in chair  Ambulation/Gait                Stairs            Wheelchair Mobility    Modified Rankin (Stroke Patients Only)       Balance Overall balance assessment: Needs assistance   Sitting balance-Leahy Scale: Poor Sitting balance - Comments: min a for sitting balance                                     Pertinent Vitals/Pain Pain Assessment: Faces Faces Pain Scale: Hurts whole  lot Pain Location: L hip with mobility and R shoulder Pain Descriptors / Indicators: Aching;Operative site guarding;Guarding Pain Intervention(s): Monitored during session;Repositioned;Patient requesting pain meds-RN notified    Home Living Family/patient expects to be discharged to:: Skilled nursing facility                 Additional Comments: from home with son, history of falls    Prior Function                 Hand Dominance        Extremity/Trunk Assessment   Upper Extremity Assessment Upper Extremity Assessment: Defer to OT evaluation    Lower Extremity Assessment Lower Extremity Assessment: LLE deficits/detail LLE Deficits / Details: AAROM limited by pain, not lifting unaided    Cervical / Trunk Assessment Cervical / Trunk Assessment: Kyphotic;Other exceptions Cervical / Trunk Exceptions: severe kyphosis and elevated shoulders  Communication   Communication: No difficulties  Cognition Arousal/Alertness: Awake/alert Behavior During Therapy: Anxious Overall Cognitive Status: No family/caregiver present to determine baseline cognitive functioning                                        General Comments      Exercises Total Joint Exercises Ankle Circles/Pumps: AROM;5 reps;Supine Heel Slides: AAROM;5 reps;Both  Assessment/Plan    PT Assessment Patient needs continued PT services  PT Problem List Decreased strength;Decreased range of motion;Decreased activity tolerance;Decreased balance;Decreased knowledge of use of DME;Pain;Decreased mobility;Decreased safety awareness       PT Treatment Interventions DME instruction;Therapeutic exercise;Patient/family education;Therapeutic activities;Balance training;Functional mobility training    PT Goals (Current goals can be found in the Care Plan section)  Acute Rehab PT Goals Patient Stated Goal: not stated PT Goal Formulation: Patient unable to participate in goal setting Time For  Goal Achievement: 11/03/16 Potential to Achieve Goals: Fair    Frequency Min 3X/week   Barriers to discharge        Co-evaluation               AM-PAC PT "6 Clicks" Daily Activity  Outcome Measure Difficulty turning over in bed (including adjusting bedclothes, sheets and blankets)?: Unable Difficulty moving from lying on back to sitting on the side of the bed? : Unable Difficulty sitting down on and standing up from a chair with arms (e.g., wheelchair, bedside commode, etc,.)?: Unable Help needed moving to and from a bed to chair (including a wheelchair)?: Total Help needed walking in hospital room?: Total Help needed climbing 3-5 steps with a railing? : Total 6 Click Score: 6    End of Session Equipment Utilized During Treatment: Gait belt Activity Tolerance: Patient limited by pain Patient left: in chair;with call bell/phone within reach;with chair alarm set Nurse Communication: Mobility status;Need for lift equipment PT Visit Diagnosis: History of falling (Z91.81);Pain;Difficulty in walking, not elsewhere classified (R26.2) Pain - Right/Left: Left Pain - part of body: Hip    Time: 2130-8657 PT Time Calculation (min) (ACUTE ONLY): 23 min   Charges:   PT Evaluation $PT Eval Moderate Complexity: 1 Mod PT Treatments $Therapeutic Activity: 8-22 mins   PT G CodesSheran Shaw, Yolanda Shaw [phone] 10/20/2016   Yolanda Shaw 10/20/2016, 1:35 PM

## 2016-10-20 NOTE — Progress Notes (Signed)
Orthopedic Tech Progress Note Patient Details:  Yolanda Shaw 1931-01-31 [id number]  Patient ID: Yolanda Shaw, female   DOB: 01/23/1931, 83 y.o.   MRN: [id number]   Yolanda Shaw 10/20/2016, 8:32 AMUnable to use overhead frame.

## 2016-10-20 NOTE — Evaluation (Signed)
Occupational Therapy Evaluation Patient Details Name: Yolanda Shaw MRN: [id number] DOB: 04-11-1931 Today's Date: 10/20/2016    History of Present Illness  Yolanda Shaw is an ambulatory 83 year old female who sustained a mechanical fall yesterday.  She sustained an intertrochanteric fracture of the left hip. She is s/p IM hip screw.  PMH positive for dementia, bilat pubic rami fx 2016 & 2018, AKI.   Clinical Impression   Pt with decline in function and safety with ADLs and ADL mobility with decreased strength, balance, endurance and cognition (hx cognitive impairments). Pt limited by pain and anxiety. Pt requires extensive assist with ADLs and mobility at this time. Family planning to d/c pt to a SNF for rehab. OT will follow acutely to address impairments to maximize level of function  Ad safety    Follow Up Recommendations  SNF;Supervision/Assistance - 24 hour    Equipment Recommendations  None recommended by OT;Other (comment) (TBD at next venue of care)    Recommendations for Other Services       Precautions / Restrictions Precautions Precautions: Fall Restrictions Weight Bearing Restrictions: Yes LLE Weight Bearing: Non weight bearing      Mobility Bed Mobility Overal bed mobility: Needs Assistance Bed Mobility: Supine to Sit     Supine to sit:  (will need)     General bed mobility comments: initiated assist for moving legs, hips and lifting trunk. Pt unable to complete due to L LE pain increasing  Transfers Overall transfer level: Needs assistance   Transfers: Stand Pivot Transfers   Stand pivot transfers: +2 physical assistance;+2 safety/equipment;Max assist       General transfer comment: unable to assess, per PT pt can SPT with max A +2, unable to maintain NWB    Balance Overall balance assessment: Needs assistance   Sitting balance-Leahy Scale: Poor Sitting balance - Comments: unable to assess       Standing balance comment: unable to assess                            ADL either performed or assessed with clinical judgement   ADL Overall ADL's : Needs assistance/impaired     Grooming: Wash/dry hands;Minimal assistance;Bed level   Upper Body Bathing: Total assistance   Lower Body Bathing: Total assistance   Upper Body Dressing : Total assistance   Lower Body Dressing: Total assistance     Toilet Transfer Details (indicate cue type and reason): unable to assess, per PT pt can SPT with max A +2, unable to maintain NWB Toileting- Clothing Manipulation and Hygiene: Total assistance;Bed level       Functional mobility during ADLs: Maximal assistance;+2 for physical assistance;+2 for safety/equipment       Vision Baseline Vision/History: Wears glasses Patient Visual Report: No change from baseline       Perception     Praxis      Pertinent Vitals/Pain Pain Assessment: 0-10 Faces Pain Scale: Hurts whole lot Pain Location: L hip with mobility and R shoulder Pain Descriptors / Indicators: Aching;Operative site guarding;Guarding Pain Intervention(s): Limited activity within patient's tolerance;Monitored during session;Repositioned;Patient requesting pain meds-RN notified     Hand Dominance Right   Extremity/Trunk Assessment Upper Extremity Assessment Upper Extremity Assessment: Generalized weakness   Lower Extremity Assessment Lower Extremity Assessment: LLE deficits/detail LLE Deficits / Details: AAROM limited by pain, not lifting unaided   Cervical / Trunk Assessment Cervical / Trunk Assessment: Kyphotic;Other exceptions Cervical / Trunk Exceptions: severe kyphosis and elevated shoulders  Communication Communication Communication: No difficulties   Cognition Arousal/Alertness: Awake/alert Behavior During Therapy: Anxious Overall Cognitive Status: Impaired/Different from baseline Area of Impairment: Memory;Following commands;Awareness;Problem solving;Attention;Orientation                      Memory: Decreased short-term memory Following Commands: Follows one step commands with increased time;Follows one step commands inconsistently     Problem Solving: Requires verbal cues;Requires tactile cues;Difficulty sequencing     General Comments   pt pleasant, anxious    Exercises    Shoulder Instructions      Home Living Family/patient expects to be discharged to:: Skilled nursing facility                                 Additional Comments: from home with son, history of falls      Prior Functioning/Environment Level of Independence: Independent with assistive device(s)                 OT Problem List: Decreased strength;Impaired balance (sitting and/or standing);Decreased cognition;Decreased knowledge of precautions;Pain;Decreased safety awareness;Decreased activity tolerance;Decreased coordination;Decreased knowledge of use of DME or AE      OT Treatment/Interventions: Self-care/ADL training;DME and/or AE instruction;Therapeutic activities;Balance training;Therapeutic exercise;Neuromuscular education;Patient/family education;Cognitive remediation/compensation    OT Goals(Current goals can be found in the care plan section) Acute Rehab OT Goals Patient Stated Goal: go to SNF for rehab OT Goal Formulation: With patient/family Time For Goal Achievement: 10/27/16 Potential to Achieve Goals: Good ADL Goals Pt Will Perform Grooming: with min assist;sitting;with min guard assist Pt Will Perform Upper Body Bathing: with max assist;with mod assist;bed level;sitting Pt Will Perform Upper Body Dressing: with max assist;with mod assist;bed level;sitting Pt Will Transfer to Toilet: with max assist;stand pivot transfer;bedside commode Additional ADL Goal #1: pt will compleet bed mobility with mod A to sit EOB in prep for grooming and UB ADLs  OT Frequency: Min 2X/week   Barriers to D/C: Decreased caregiver support          Co-evaluation               AM-PAC PT "6 Clicks" Daily Activity     Outcome Measure Help from another person eating meals?: A Little Help from another person taking care of personal grooming?: Total Help from another person toileting, which includes using toliet, bedpan, or urinal?: Total Help from another person bathing (including washing, rinsing, drying)?: Total Help from another person to put on and taking off regular upper body clothing?: Total Help from another person to put on and taking off regular lower body clothing?: Total 6 Click Score: 8   End of Session    Activity Tolerance: Patient limited by pain Patient left: in bed;with call bell/phone within reach;with bed alarm set;with family/visitor present  OT Visit Diagnosis: Other symptoms and signs involving cognitive function;Muscle weakness (generalized) (M62.81);History of falling (Z91.81);Pain Pain - Right/Left: Left Pain - part of body: Leg;Hip;Shoulder (R shoulder,  L LE)                Time: 0981-1914 OT Time Calculation (min): 29 min Charges:  OT General Charges $OT Visit: 1 Visit OT Evaluation $OT Eval Moderate Complexity: 1 Mod OT Treatments $Therapeutic Activity: 8-22 mins G-Codes: OT G-codes **NOT FOR INPATIENT CLASS** Functional Assessment Tool Used: AM-PAC 6 Clicks Daily Activity     Galen Manila 10/20/2016, 2:17 PM

## 2016-10-20 NOTE — Progress Notes (Signed)
Initial Nutrition Assessment  DOCUMENTATION CODES:   Not applicable  INTERVENTION:  Provide Ensure Enlive po once daily, each supplement provides 350 kcal and 20 grams of protein.  Recommend obtaining new weight to fully assess weight trends.  Encourage adequate PO intake.   NUTRITION DIAGNOSIS:   Increased nutrient needs related to  (post op healing) as evidenced by estimated needs.  GOAL:   Patient will meet greater than or equal to 90% of their needs  MONITOR:   PO intake, Supplement acceptance, Labs, Weight trends, Skin, I & O's  REASON FOR ASSESSMENT:   Consult Hip fracture protocol  ASSESSMENT:   83 y.o. female with history of dementia who was brought to the ER after patient had a fall at home. X-rays revealed  Left hip fracture  PROCEDURE (10/15): INTRAMEDULLARY (IM) NAIL INTERTROCHANTRIC  Meal completion has been 75%. Pt reports having a good appetite currently and PTA with usual consumption of at least 3 meals a day. No family at bedside. Noted no recent weight recorded. Recommend obtaining new weight to fully assess weight trends. RD to order nutritional supplements to aid in post op healing.   Nutrition-Focused physical exam completed. Findings are no fat depletion, severe muscle depletion, and no edema.   Labs and medications reviewed.   Diet Order:  Diet regular Room service appropriate? Yes; Fluid consistency: Thin  Skin:   (Incision L hip)  Last BM:  Unknown  Height:   Ht Readings from Last 1 Encounters:  03/30/16  (1.575 m)    Weight:   Wt Readings from Last 1 Encounters:  03/30/16 105 lb 12.8 oz (48 kg)    Ideal Body Weight:  50 kg  BMI:  There is no height or weight on file to calculate BMI.  Estimated Nutritional Needs:   Kcal:  1350-1550  Protein:  55-65 grams  Fluid:  >/= 1.5 L/day  EDUCATION NEEDS:   No education needs identified at this time  Roslyn Smiling, MS, RD, LDN Pager # [phone] After hours/ weekend  pager # [phone]

## 2016-10-20 NOTE — Op Note (Signed)
NAME:  Shaw, Yolanda                     ACCOUNT NO.:  MEDICAL RECORD NO.:  [id number]  LOCATION:                                 FACILITY:  PHYSICIAN:  Burnard Bunting, M.D.    DATE OF BIRTH:  08/07/1931  DATE OF PROCEDURE: DATE OF DISCHARGE:                              OPERATIVE REPORT   PREOPERATIVE DIAGNOSIS:  Left hip intertrochanteric fracture.  POSTOPERATIVE DIAGNOSIS:  Left hip intertrochanteric fracture.  PROCEDURE:  Left hip intertrochanteric fracture fixation with intramedullary hip screw, Smith [employer] Nephew 10 x 36-cm with 95 lag screw and 90-mm compression screw.  SURGEON:  Burnard Bunting, M.D.  ASSISTANT:  None.  ANESTHESIA:  General.  ESTIMATED BLOOD LOSS:  150.  INDICATIONS:  Yolanda Shaw is an ambulatory female who fell and broke her left hip, presents for operative management after explanation of risks and benefits.  PROCEDURE IN DETAIL:  The patient was brought to the operating room where general anesthetic was induced.  Preoperative antibiotics were administered.  Time-out was called.  The patient was placed on the fracture bed with the right leg in lithotomy position.  Peroneal nerve well padded.  Traction was applied to the left leg and with rotational adjustment under fluoroscopic guidance, near anatomic reduction was achieved.  At this time, prepping and draping were performed with prescrubbed with alcohol and Betadine, and then scrubbed with DuraPrep. At this time, time-out was called.  Under fluoroscopic guidance, proximal incision was made handbreadth proximal to the trochanter. Guidepin was placed, proximal reaming performed in accordance with preoperative templating.  The nail was placed and through a separate incision, a 90-95 compression and lag screw were placed.  Good fracture reduction was maintained.  Thorough irrigation was performed of both incisions, which were then closed using 0 Vicryl suture, 2-0 Vicryl suture and 3-0 nylon.  The patient  tolerated the procedure well without immediate complication.  Transferred to the recovery room in stable condition.    Burnard Bunting, M.D.    GSD/MEDQ  D:  10/19/2016  T:  10/20/2016  Job:  [id number]

## 2016-10-20 NOTE — Progress Notes (Signed)
PROGRESS NOTE    Yolanda Shaw  ZOX:[id number] DOB: [birth year] DOA: 10/18/2016 PCP: Georgann Housekeeper, MD   Brief Narrative: 83 year old female with history of dementia had a fall at home associated with left hip fracture. S/p hip surgery on 10/15.  Assessment & Plan:  # fall/  Closed left hip fracture, initial encounter [facility]): -s/p Hip surgery -on ASA as per ortho -PT/OT/SW consult SNF placement -I have discussed with the patient's daughter yesterday and sedated with the plan. -Pain management and supportive care.  #  Dementia with behavioral disturbance? Likely Alzheimer; continue supportive care, namenda.   #acute blood loss anemia/thrombocytopenia: Repeat CBC.no sign of active bleeding.  DVT prophylaxis: on ASA per ortho Code Status:full code Family Communication:regular diet Disposition Plan:currently admitted, discharged to skilled facility in 1-2 days    Consultants:   orthopedics  Procedures:hip surgery Antimicrobials:clindamycin dose before surgery  Subjective: Seen and examined at bedside. Reports generalized body pain. Review of systems Limited because of her dementia.  Objective: Vitals:   10/19/16 2335 10/20/16 0007 10/20/16 0133 10/20/16 0513  BP: 137/76 134/78 129/76 (!) 150/89  Pulse: 91 86 92 (!) 103  Resp: (!) Temp: 97.7 F (36.5 C) 97.8 F (36.6 C) 97.7 F (36.5 C) 97.7 F (36.5 C)  TempSrc:    Oral  SpO2: 96% 96% 97% 91%    Intake/Output Summary (Last 24 hours) at 10/20/16 1332 Last data filed at 10/20/16 0900  Gross per 24 hour  Intake             1060 ml  Output              300 ml  Net              760 ml   There were no vitals filed for this visit.  Examination:  General exam: elderly female lying on bed Respiratory system: Clear to auscultation. Respiratory effort normal. No wheezing or crackle Cardiovascular system: S1 & S2 heard, RRR.  No pedal edema. Gastrointestinal system: Abdomen is soft and nontender. Normal  bowel sounds heard. Central nervous system: Alert awake but not oriented Skin: No rashes, lesions or ulcers Psychiatry: Judgement and insight appear impaired.     Data Reviewed: I have personally reviewed following labs and imaging studies  CBC:  Recent Labs Lab 10/19/16 0036 10/19/16 0420 10/20/16 0625  WBC 16.7* 15.7* 10.7*  NEUTROABS 14.9*  --   --   HGB 14.4 13.3 10.4*  HCT 42.9 41.1 31.6*  MCV 93.7 94.5 93.2  PLT 135* 123* 88*   Basic Metabolic Panel:  Recent Labs Lab 10/19/16 0036 10/19/16 0420 10/20/16 0625  NA 141 141 142  K 4.4 4.6 4.8  CL 110 111 112*  CO2 21* 21* 22  GLUCOSE 167* 171* 164*  BUN 24* 27* 27*  CREATININE 1.00 1.10* 0.96  CALCIUM 8.9 8.8* 8.6*   GFR: CrCl cannot be calculated (Unknown ideal weight.). Liver Function Tests: No results for input(s): AST, ALT, ALKPHOS, BILITOT, PROT, ALBUMIN in the last 168 hours. No results for input(s): LIPASE, AMYLASE in the last 168 hours. No results for input(s): AMMONIA in the last 168 hours. Coagulation Profile:  Recent Labs Lab 10/19/16 0036 10/20/16 0625  INR 1.07 1.20   Cardiac Enzymes: No results for input(s): CKTOTAL, CKMB, CKMBINDEX, TROPONINI in the last 168 hours. BNP (last 3 results) No results for input(s): PROBNP in the last 8760 hours. HbA1C:  Recent Labs  10/19/16 0724  HGBA1C 5.7*  CBG:  Recent Labs Lab 10/19/16 1222 10/19/16 1542  GLUCAP 114* 126*   Lipid Profile: No results for input(s): CHOL, HDL, LDLCALC, TRIG, CHOLHDL, LDLDIRECT in the last 72 hours. Thyroid Function Tests: No results for input(s): TSH, T4TOTAL, FREET4, T3FREE, THYROIDAB in the last 72 hours. Anemia Panel: No results for input(s): VITAMINB12, FOLATE, FERRITIN, TIBC, IRON, RETICCTPCT in the last 72 hours. Sepsis Labs: No results for input(s): PROCALCITON, LATICACIDVEN in the last 168 hours.  Recent Results (from the past 240 hour(s))  MRSA PCR Screening     Status: None   Collection Time:  10/19/16  1:25 PM  Result Value Ref Range Status   MRSA by PCR NEGATIVE NEGATIVE Final    Comment:        The GeneXpert MRSA Assay (FDA approved for NASAL specimens only), is one component of a comprehensive MRSA colonization surveillance program. It is not intended to diagnose MRSA infection nor to guide or monitor treatment for MRSA infections.          Radiology Studies: Dg Chest 1 View  Result Date: 10/18/2016 CLINICAL DATA:  Left hip fracture EXAM: CHEST 1 VIEW COMPARISON:  01/03/2013 FINDINGS: Shallow inspiration. No airspace consolidation. No pneumothorax or significant effusion. Moderate aortic tortuosity, unchanged allowing for differences in projection. Normal pulmonary vasculature. IMPRESSION: No acute cardiopulmonary findings. Electronically Signed   By: Ellery Plunk M.D.   On: 10/18/2016 23:26   Ct Head Wo Contrast  Result Date: 10/19/2016 CLINICAL DATA:  Fall.  Head and neck pain. EXAM: CT HEAD WITHOUT CONTRAST CT CERVICAL SPINE WITHOUT CONTRAST TECHNIQUE: Multidetector CT imaging of the head and cervical spine was performed following the standard protocol without intravenous contrast. Multiplanar CT image reconstructions of the cervical spine were also generated. COMPARISON:  02/17/2016 FINDINGS: CT HEAD FINDINGS Brain: No evidence of acute infarction, hemorrhage, hydrocephalus, extra-axial collection or mass lesion/mass effect. Diffuse cerebral atrophy. Ventricular dilatation consistent with central atrophy. Low-attenuation changes in the deep white matter consistent with small vessel ischemia. Vascular: No hyperdense vessel or unexpected calcification. Skull: Calvarium appears intact. Sinuses/Orbits: Visualized paranasal sinuses and mastoid air cells are not opacified. Other: Small subcutaneous scalp lesion over the right anterior frontal region may represent a small hematoma or sebaceous cyst. CT CERVICAL SPINE FINDINGS Alignment: Normal alignment of the cervical  spine. C1-2 articulation appears intact. Skull base and vertebrae: There is anterior compression of the T6 vertebral body. No prior comparison studies of this area are available for correlation but other old compression deformities are present in this likely represents chronic compression. No definite evidence of any acute cortical irregularities. Mild chronic compression of the superior endplate of T1. No cervical compression deformities identified. No destructive bone lesions. Soft tissues and spinal canal: No prevertebral soft tissue swelling. No paraspinal mass or soft tissue infiltration. Disc levels: Mild degenerative changes diffusely throughout the cervical spine with narrowed interspaces and endplate hypertrophic changes. Prominent degenerative changes at C1-2. Upper chest: Emphysematous changes in the lung apices. Thyroid gland appears mildly enlarged, likely representing thyroid goiter. No change since previous study. Other: None. IMPRESSION: 1. No acute intracranial abnormalities. Chronic atrophy and small vessel ischemic changes. 2. Normal alignment of the cervical spine. Degenerative changes. No acute displaced cervical spine fractures identified. 3. Compression deformities at T1 and T6 are likely old. Electronically Signed   By: Burman Nieves M.D.   On: 10/19/2016 02:33   Ct Cervical Spine Wo Contrast  Result Date: 10/19/2016 CLINICAL DATA:  Fall.  Head and neck pain. EXAM:  CT HEAD WITHOUT CONTRAST CT CERVICAL SPINE WITHOUT CONTRAST TECHNIQUE: Multidetector CT imaging of the head and cervical spine was performed following the standard protocol without intravenous contrast. Multiplanar CT image reconstructions of the cervical spine were also generated. COMPARISON:  02/17/2016 FINDINGS: CT HEAD FINDINGS Brain: No evidence of acute infarction, hemorrhage, hydrocephalus, extra-axial collection or mass lesion/mass effect. Diffuse cerebral atrophy. Ventricular dilatation consistent with central  atrophy. Low-attenuation changes in the deep white matter consistent with small vessel ischemia. Vascular: No hyperdense vessel or unexpected calcification. Skull: Calvarium appears intact. Sinuses/Orbits: Visualized paranasal sinuses and mastoid air cells are not opacified. Other: Small subcutaneous scalp lesion over the right anterior frontal region may represent a small hematoma or sebaceous cyst. CT CERVICAL SPINE FINDINGS Alignment: Normal alignment of the cervical spine. C1-2 articulation appears intact. Skull base and vertebrae: There is anterior compression of the T6 vertebral body. No prior comparison studies of this area are available for correlation but other old compression deformities are present in this likely represents chronic compression. No definite evidence of any acute cortical irregularities. Mild chronic compression of the superior endplate of T1. No cervical compression deformities identified. No destructive bone lesions. Soft tissues and spinal canal: No prevertebral soft tissue swelling. No paraspinal mass or soft tissue infiltration. Disc levels: Mild degenerative changes diffusely throughout the cervical spine with narrowed interspaces and endplate hypertrophic changes. Prominent degenerative changes at C1-2. Upper chest: Emphysematous changes in the lung apices. Thyroid gland appears mildly enlarged, likely representing thyroid goiter. No change since previous study. Other: None. IMPRESSION: 1. No acute intracranial abnormalities. Chronic atrophy and small vessel ischemic changes. 2. Normal alignment of the cervical spine. Degenerative changes. No acute displaced cervical spine fractures identified. 3. Compression deformities at T1 and T6 are likely old. Electronically Signed   By: Burman Nieves M.D.   On: 10/19/2016 02:33   Pelvis Portable  Result Date: 10/20/2016 CLINICAL DATA:  Left intramedullary nail fixation of femoral fracture. EXAM: PORTABLE PELVIS 1-2 VIEWS COMPARISON:   Intraoperative images from earlier the same day. 01/01/2016 CT FINDINGS: Long-stem left femoral nail fixation across an intertrochanteric fracture of the left femur with avulsed lesser trochanter. Postoperative subcutaneous emphysema seen along the lateral aspect of the left thigh. Bilateral inferior pubic rami fractures with displaced appearing right superior pubic ramus fracture relative to prior CT from 01/01/2016. Osteoarthritic joint space narrowing of both hips. IMPRESSION: 1. Intact long-stem femoral nail fixation of a left femoral intertrochanteric fracture with avulsed lesser trochanter. 2. Chronic bilateral inferior pubic rami fractures with new (since 2017) appearing right superior pubic ramus fracture. Electronically Signed   By: Tollie Eth M.D.   On: 10/20/2016 00:45   Dg Shoulder Left  Result Date: 10/19/2016 CLINICAL DATA:  Initial evaluation for acute trauma, fall, left shoulder pain. EXAM: LEFT SHOULDER - 2+ VIEW COMPARISON:  None. FINDINGS: Bones are diffusely osteopenic. No acute fracture or dislocation. Humeral head in normal line with the glenoid. AC joint approximated. Dystrophic soft tissue calcification noted inferior to the humeral head. No acute soft tissue abnormality. Visualized left hemithorax grossly clear. IMPRESSION: 1. No acute osseous abnormality about the left shoulder. 2. Osteopenia. Electronically Signed   By: Rise Mu M.D.   On: 10/19/2016 00:30   Dg C-arm 1-60 Min  Result Date: 10/20/2016 CLINICAL DATA:  Left intramedullary nail. EXAM: LEFT FEMUR 2 VIEWS; DG C-ARM 61-120 MIN COMPARISON:  Left hip radiographs 10/18/2016 FINDINGS: Intraoperative fluoroscopy was obtained for surgical control purposes. Fluoroscopy time is recorded at 1 minute  53 seconds. Four spot fluoroscopic images were obtained. Images demonstrate interval placement of long intramedullary rod with proximal compression screws across stone inter trochanteric fracture of the proximal left  femur. Improved alignment of the fracture fragments post fixation. There is still residual displacement of a lesser trochanteric fragment. A locking screw is present in the distal aspect of the intramedullary rod. Degenerative changes are suggested in the left knee. IMPRESSION: Intraoperative fluoroscopy for surgical control purposes demonstrating intramedullary rod and compression screw fixation across stone inter trochanteric fracture of the proximal left femur. Improved position of fracture fragments with residual displacement of a lesser trochanteric fragment. Electronically Signed   By: Burman Nieves M.D.   On: 10/20/2016 00:01   Dg Hip Unilat With Pelvis 2-3 Views Left  Result Date: 10/18/2016 CLINICAL DATA:  Larey Seat this evening. EXAM: DG HIP (WITH OR WITHOUT PELVIS) 2-3V LEFT COMPARISON:  None. FINDINGS: There is an intertrochanteric left hip fracture with varus angulation. No dislocation. No radiographic findings to suggest a pathologic basis for the fracture. Healed pubic ramus fractures are present bilaterally. IMPRESSION: Intertrochanteric left hip fracture. Electronically Signed   By: Ellery Plunk M.D.   On: 10/18/2016 23:25   Dg Femur Min 2 Views Left  Result Date: 10/20/2016 CLINICAL DATA:  Left intramedullary nail. EXAM: LEFT FEMUR 2 VIEWS; DG C-ARM 61-120 MIN COMPARISON:  Left hip radiographs 10/18/2016 FINDINGS: Intraoperative fluoroscopy was obtained for surgical control purposes. Fluoroscopy time is recorded at 1 minute 53 seconds. Four spot fluoroscopic images were obtained. Images demonstrate interval placement of long intramedullary rod with proximal compression screws across stone inter trochanteric fracture of the proximal left femur. Improved alignment of the fracture fragments post fixation. There is still residual displacement of a lesser trochanteric fragment. A locking screw is present in the distal aspect of the intramedullary rod. Degenerative changes are suggested in  the left knee. IMPRESSION: Intraoperative fluoroscopy for surgical control purposes demonstrating intramedullary rod and compression screw fixation across stone inter trochanteric fracture of the proximal left femur. Improved position of fracture fragments with residual displacement of a lesser trochanteric fragment. Electronically Signed   By: Burman Nieves M.D.   On: 10/20/2016 00:01        Scheduled Meds: . aspirin EC  325 mg Oral Q breakfast  . famotidine  20 mg Oral Daily  . memantine  10 mg Oral BID   Continuous Infusions: . 0.9 % NaCl with KCl 20 mEq / L 75 mL/hr at 10/20/16 0032     LOS: 1 day    Helena Sardo Jaynie Collins, MD [facility] Pager [phone]  If 7PM-7AM, please contact night-coverage [email or web address] Password [email or web address] 10/20/2016, 1:32 PM

## 2016-10-21 DIAGNOSIS — D696 Thrombocytopenia, unspecified: Secondary | ICD-10-CM

## 2016-10-21 DIAGNOSIS — D62 Acute posthemorrhagic anemia: Secondary | ICD-10-CM

## 2016-10-21 DIAGNOSIS — F0391 Unspecified dementia with behavioral disturbance: Secondary | ICD-10-CM

## 2016-10-21 LAB — CBC
HEMATOCRIT: 26.2 % — AB (ref 36.0–46.0)
HEMOGLOBIN: 8.9 g/dL — AB (ref 12.0–15.0)
MCH: 31.3 pg (ref 26.0–34.0)
MCHC: 34 g/dL (ref 30.0–36.0)
MCV: 92.3 fL (ref 78.0–100.0)
Platelets: 93 10*3/uL — ABNORMAL LOW (ref 150–400)
RBC: 2.84 MIL/uL — AB (ref 3.87–5.11)
RDW: 14.7 % (ref 11.5–15.5)
WBC: 10.4 10*3/uL (ref 4.0–10.5)

## 2016-10-21 MED ORDER — POTASSIUM CHLORIDE IN NACL 20-0.9 MEQ/L-% IV SOLN
INTRAVENOUS | Status: AC
  Filled 2016-10-21: qty 1000

## 2016-10-21 NOTE — Progress Notes (Signed)
PROGRESS NOTE   Kenzie Thoreson  NWG:[id number]    DOB: 09-Jan-1931    DOA: 10/18/2016  PCP: Georgann Housekeeper, MD   I have briefly reviewed patients previous medical records in [facility].  Brief Narrative:  83 year old female with PMH of dementia, HTN, presented to ED after a fall at home and persistent left hip pain. She reportedly had taken her Xanax and tried to walk when she fell. Diagnosed with left hip fracture, status post ORIF on 10/15. Possible DC to SNF on 10/18.   Assessment & Plan:   Principal Problem:   Closed left hip fracture, initial encounter [facility]) Active Problems:   Dementia with behavioral disturbance   Hip fracture (HCC)   Acute blood loss anemia   Left hip fracture Sustained status post mechanical fall. Fall may be complicated by her advanced age, dementia, Xanax. Orthopedics consulted and patient is status post ORIF/IM nail on 10/19/16. Await orthopedic follow-up regarding recommendations for discharge to SNF including weightbearing, DVT prophylaxis, postop wound care and outpatient follow-up.  Pubic rami fractures  - Per imaging, chronic bilateral inferior pubic rami fractures and new (since 2017) appearing right superior pubic ramus fracture  Dementia with behavioral disturbance Mental status probably at baseline. Try to avoid benzodiazepine or sedating medications to prevent falls.  Postop acute blood loss anemia Follow CBCs in a.m. and transfuse if hemoglobin <7 g per DL.  Thrombocytopenia Likely due to acute blood loss. Follow CBCs in a.m.  Prediabetes - A1c 5.7.  Sinus tachycardia - May be related to pain and some volume depletion. Brief IV fluids. Follow clinically. Asymptomatic.   DVT prophylaxis: On aspirin as per orthopedics. Code Status: Full Family Communication: None at bedside Disposition: DC to SNF, possibly 10/18 pending bed availability.   Consultants:  Orthopedics   Procedures:  ORIF left hip fracture on  10/15  Antimicrobials:  None    Subjective: Pleasantly confused. Doesn't know that she had her hip fracture surgery. Reports some pain in the operated left hip. No chest pain, dyspnea reported.   ROS: Unable due to dementia.  Objective:  Vitals:   10/20/16 1545 10/20/16 2119 10/21/16 0534 10/21/16 1300  BP: (!) 145/79 (!) 107/53 (!) 151/68 132/62  Pulse: (!) 111 (!) 102 (!) 116 (!) 109  Resp: 16 16 16 16   Temp: 99.2 F (37.3 C) 98.9 F (37.2 C) 98.4 F (36.9 C) 99.1 F (37.3 C)  TempSrc: Oral Oral Oral Oral  SpO2: 93% 93% 93% 93%    Examination:  General exam: Elderly female, moderately built and frail, lying comfortably propped up in bed. Respiratory system: Clear to auscultation. Respiratory effort normal. Cardiovascular system: S1 & S2 heard, RRR. No JVD, murmurs, rubs, gallops or clicks. No pedal edema. Gastrointestinal system: Abdomen is nondistended, soft and nontender. No organomegaly or masses felt. Normal bowel sounds heard. Central nervous system: Alert and oriented only to self. No focal neurological deficits. Extremities: Symmetric 5 x 5 power except left lower extremity where movements restricted by pain. Left hip postop dressing site clean and dry. Skin: No rashes, lesions or ulcers Psychiatry: Judgement and insight impaired. Mood & affect confused.    Data Reviewed: I have personally reviewed following labs and imaging studies  CBC:  Recent Labs Lab 10/19/16 0036 10/19/16 0420 10/20/16 0625 10/21/16 0521  WBC 16.7* 15.7* 10.7* 10.4  NEUTROABS 14.9*  --   --   --   HGB 14.4 13.3 10.4* 8.9*  HCT 42.9 41.1 31.6* 26.2*  MCV 93.7 94.5 93.2  92.3  PLT 135* 123* 88* 93*   Basic Metabolic Panel:  Recent Labs Lab 10/19/16 0036 10/19/16 0420 10/20/16 0625  NA 141 141 142  K 4.4 4.6 4.8  CL 110 111 112*  CO2 21* 21* 22  GLUCOSE 167* 171* 164*  BUN 24* 27* 27*  CREATININE 1.00 1.10* 0.96  CALCIUM 8.9 8.8* 8.6*   Coagulation Profile:  Recent  Labs Lab 10/19/16 0036 10/20/16 0625  INR 1.07 1.20   HbA1C:  Recent Labs  10/19/16 0724  HGBA1C 5.7*   CBG:  Recent Labs Lab 10/19/16 1222 10/19/16 1542  GLUCAP 114* 126*    Recent Results (from the past 240 hour(s))  MRSA PCR Screening     Status: None   Collection Time: 10/19/16  1:25 PM  Result Value Ref Range Status   MRSA by PCR NEGATIVE NEGATIVE Final    Comment:        The GeneXpert MRSA Assay (FDA approved for NASAL specimens only), is one component of a comprehensive MRSA colonization surveillance program. It is not intended to diagnose MRSA infection nor to guide or monitor treatment for MRSA infections.          Radiology Studies: Pelvis Portable  Result Date: 10/20/2016 CLINICAL DATA:  Left intramedullary nail fixation of femoral fracture. EXAM: PORTABLE PELVIS 1-2 VIEWS COMPARISON:  Intraoperative images from earlier the same day. 01/01/2016 CT FINDINGS: Long-stem left femoral nail fixation across an intertrochanteric fracture of the left femur with avulsed lesser trochanter. Postoperative subcutaneous emphysema seen along the lateral aspect of the left thigh. Bilateral inferior pubic rami fractures with displaced appearing right superior pubic ramus fracture relative to prior CT from 01/01/2016. Osteoarthritic joint space narrowing of both hips. IMPRESSION: 1. Intact long-stem femoral nail fixation of a left femoral intertrochanteric fracture with avulsed lesser trochanter. 2. Chronic bilateral inferior pubic rami fractures with new (since 2017) appearing right superior pubic ramus fracture. Electronically Signed   By: Tollie Eth M.D.   On: 10/20/2016 00:45   Dg C-arm 1-60 Min  Result Date: 10/20/2016 CLINICAL DATA:  Left intramedullary nail. EXAM: LEFT FEMUR 2 VIEWS; DG C-ARM 61-120 MIN COMPARISON:  Left hip radiographs 10/18/2016 FINDINGS: Intraoperative fluoroscopy was obtained for surgical control purposes. Fluoroscopy time is recorded at 1  minute 53 seconds. Four spot fluoroscopic images were obtained. Images demonstrate interval placement of long intramedullary rod with proximal compression screws across stone inter trochanteric fracture of the proximal left femur. Improved alignment of the fracture fragments post fixation. There is still residual displacement of a lesser trochanteric fragment. A locking screw is present in the distal aspect of the intramedullary rod. Degenerative changes are suggested in the left knee. IMPRESSION: Intraoperative fluoroscopy for surgical control purposes demonstrating intramedullary rod and compression screw fixation across stone inter trochanteric fracture of the proximal left femur. Improved position of fracture fragments with residual displacement of a lesser trochanteric fragment. Electronically Signed   By: Burman Nieves M.D.   On: 10/20/2016 00:01   Dg Femur Min 2 Views Left  Result Date: 10/20/2016 CLINICAL DATA:  Left intramedullary nail. EXAM: LEFT FEMUR 2 VIEWS; DG C-ARM 61-120 MIN COMPARISON:  Left hip radiographs 10/18/2016 FINDINGS: Intraoperative fluoroscopy was obtained for surgical control purposes. Fluoroscopy time is recorded at 1 minute 53 seconds. Four spot fluoroscopic images were obtained. Images demonstrate interval placement of long intramedullary rod with proximal compression screws across stone inter trochanteric fracture of the proximal left femur. Improved alignment of the fracture fragments post fixation. There is  still residual displacement of a lesser trochanteric fragment. A locking screw is present in the distal aspect of the intramedullary rod. Degenerative changes are suggested in the left knee. IMPRESSION: Intraoperative fluoroscopy for surgical control purposes demonstrating intramedullary rod and compression screw fixation across stone inter trochanteric fracture of the proximal left femur. Improved position of fracture fragments with residual displacement of a lesser  trochanteric fragment. Electronically Signed   By: Burman NievesWilliam  Stevens M.D.   On: 10/20/2016 00:01        Scheduled Meds: . aspirin EC  325 mg Oral Q breakfast  . famotidine  20 mg Oral Daily  . feeding supplement (ENSURE ENLIVE)  237 mL Oral Q1500  . memantine  10 mg Oral BID   Continuous Infusions: . 0.9 % NaCl with KCl 20 mEq / L 40 mL/hr at 10/20/16 1624     LOS: 2 days     Pinky Ravan, MD, FACP, FHM. [facility] Pager [phone]  If 7PM-7AM, please contact night-coverage [email or web address] Password [email or web address] 10/21/2016, 4:10 PM

## 2016-10-21 NOTE — Anesthesia Postprocedure Evaluation (Signed)
Anesthesia Post Note  Patient: Yolanda Shaw  Procedure(s) Performed: INTRAMEDULLARY (IM) NAIL INTERTROCHANTRIC (Left Hip)     Patient location during evaluation: PACU Anesthesia Type: General Level of consciousness: sedated and patient cooperative Pain management: pain level controlled Vital Signs Assessment: post-procedure vital signs reviewed and stable Respiratory status: spontaneous breathing Cardiovascular status: stable Anesthetic complications: no    Last Vitals:  Vitals:   10/20/16 2119 10/21/16 0534  BP: (!) 107/53 (!) 151/68  Pulse: (!) 102 (!) 116  Resp: 16 16  Temp: 37.2 C 36.9 C  SpO2: 93% 93%    Last Pain:  Vitals:   10/21/16 0534  TempSrc: Oral  PainSc:                  Yolanda Shaw

## 2016-10-21 NOTE — Social Work (Signed)
CSW met with daughter at bedside. She indicated that she would like patient to go to [facility] as she has been to that SNF in the past. CSW discussed options and availability. CSW obtained permission to send to other SNF's in the area as well as she [place] in [place] and her brother [place] in [place]   Pt will meet medicare qualifying stay tomorrow and may DC tomorrow if medically ready.  CSW will continue to f/u.  Elissa Hefty, LCSW Clinical Social Worker [phone]

## 2016-10-21 NOTE — [place] FL2 (Signed)
[place] MEDICAID FL2 LEVEL OF CARE SCREENING TOOL     IDENTIFICATION  Patient Name: Yolanda Shaw Birthdate: 01/17/1931 Sex: female Admission Date (Current Location): 10/18/2016  [facility] [place]   Facility and Address:  [facility] [place], [address], [place], [place] [address]      Provider Number: [id number]  Attending Physician Name and Address:  Elease EtienneHongalgi, Anand D, MD  Relative Name and Phone Number:  son, Gabriel RungJoe [phone], Lalla BrothersKim Haley [phone]    Current Level of Care: Hospital Recommended Level of Care: Skilled Nursing Facility Prior Approval Number:    Date Approved/Denied:   PASRR Number: [id number] [id number]  Discharge Plan: SNF    Current Diagnoses: Patient Active Problem List   Diagnosis Date Noted  . Acute blood loss anemia   . Closed left hip fracture, initial encounter (HCC) 10/19/2016  . Hip fracture (HCC) 10/19/2016  . Anemia 03/02/2016  . Closed stable fracture of multiple pubic rami (HCC) 02/17/2016  . Closed fracture of right proximal humerus   . Essential hypertension   . AKI (acute kidney injury) (HCC)   . Dementia with behavioral disturbance   . Pelvic fracture (HCC) 06/30/2014  . Thrombocytopenia (HCC) 06/30/2014  . Tremor 06/30/2014  . Pressure ulcer 06/30/2014  . Fracture of left pubis (HCC)   . Ischium fracture (HCC)   . Left hip pain   . Bilateral pubic rami fractures (HCC) 06/29/2014  . Atrial fibrillation (HCC) 03/02/2014    Orientation RESPIRATION BLADDER Height & Weight     Self  Normal Indwelling catheter Weight:   Height:     BEHAVIORAL SYMPTOMS/MOOD NEUROLOGICAL BOWEL NUTRITION STATUS  Other (Comment) (memory, confused)   Continent Diet (See DC Summary)  AMBULATORY STATUS COMMUNICATION OF NEEDS Skin   Extensive Assist Verbally Surgical wounds (Silver Hydrofiber Dressing)                       Personal Care Assistance Level of Assistance  Dressing, Bathing, Feeding Bathing  Assistance: Maximum assistance Feeding assistance: Limited assistance Dressing Assistance: Maximum assistance     Functional Limitations Info             SPECIAL CARE FACTORS FREQUENCY  PT (By licensed PT), OT (By licensed OT)     PT Frequency: 3x week OT Frequency: 2x week            Contractures Contractures Info: Not present    Additional Factors Info  Code Status, Allergies Code Status Info: Full Code Allergies Info: ETHER, AMITRIPTYLINE, PENICILLINS            Current Medications (10/21/2016):  This is the current hospital active medication list Current Facility-Administered Medications  Medication Dose Route Frequency Provider Last Rate Last Dose  . 0.9 % NaCl with KCl 20 mEq/ L  infusion   Intravenous Continuous Maxie BarbBhandari, Dron Prasad, MD 40 mL/hr at 10/20/16 1624    . acetaminophen (TYLENOL) tablet 650 mg  650 mg Oral Q6H PRN Cammy Copaean, Gregory Scott, MD   650 mg at 10/20/16 1631   Or  . acetaminophen (TYLENOL) suppository 650 mg  650 mg Rectal Q6H PRN Cammy Copaean, Gregory Scott, MD      . ALPRAZolam Prudy Feeler(XANAX) tablet 0.25 mg  0.25 mg Oral BID PRN Eduard ClosKakrakandy, Arshad N, MD   0.25 mg at 10/20/16 1248  . aspirin EC tablet 325 mg  325 mg Oral Q breakfast Cammy Copaean, Gregory Scott, MD   325 mg at 10/21/16 0900  .  famotidine (PEPCID) tablet 20 mg  20 mg Oral Daily Norva Pavlov, RPH   20 mg at 10/21/16 0900  . feeding supplement (ENSURE ENLIVE) (ENSURE ENLIVE) liquid 237 mL  237 mL Oral Q1500 Maxie Barb, MD   237 mL at 10/20/16 1630  . haloperidol lactate (HALDOL) injection 5 mg  5 mg Intramuscular Q6H PRN Eduard Clos, MD   5 mg at 10/20/16 0200  . HYDROcodone-acetaminophen (NORCO/VICODIN) 5-325 MG per tablet 1-2 tablet  1-2 tablet Oral Q6H PRN Cammy Copa, MD   1 tablet at 10/20/16 2123  . memantine (NAMENDA) tablet 10 mg  10 mg Oral BID Eduard Clos, MD   10 mg at 10/21/16 0900  . menthol-cetylpyridinium (CEPACOL) lozenge 3 mg  1 lozenge Oral  PRN Cammy Copa, MD       Or  . phenol (CHLORASEPTIC) mouth spray 1 spray  1 spray Mouth/Throat PRN Cammy Copa, MD      . metoCLOPramide (REGLAN) tablet 5-10 mg  5-10 mg Oral Q8H PRN Cammy Copa, MD       Or  . metoCLOPramide (REGLAN) injection 5-10 mg  5-10 mg Intravenous Q8H PRN Cammy Copa, MD      . morphine 4 MG/ML injection 0.52 mg  0.52 mg Intravenous Q2H PRN Eduard Clos, MD   0.52 mg at 10/20/16 0247  . ondansetron (ZOFRAN) tablet 4 mg  4 mg Oral Q6H PRN Cammy Copa, MD       Or  . ondansetron [facility]) injection 4 mg  4 mg Intravenous Q6H PRN August Saucer Corrie Mckusick, MD         Discharge Medications: Please see discharge summary for a list of discharge medications.  Relevant Imaging Results:  Relevant Lab Results:   Additional Information SS#:[id number]  Tresa Moore, LCSW

## 2016-10-22 DIAGNOSIS — K5900 Constipation, unspecified: Secondary | ICD-10-CM | POA: Diagnosis not present

## 2016-10-22 DIAGNOSIS — R41841 Cognitive communication deficit: Secondary | ICD-10-CM | POA: Diagnosis not present

## 2016-10-22 DIAGNOSIS — G8911 Acute pain due to trauma: Secondary | ICD-10-CM | POA: Diagnosis not present

## 2016-10-22 DIAGNOSIS — M79605 Pain in left leg: Secondary | ICD-10-CM | POA: Diagnosis not present

## 2016-10-22 DIAGNOSIS — S72002A Fracture of unspecified part of neck of left femur, initial encounter for closed fracture: Secondary | ICD-10-CM | POA: Diagnosis not present

## 2016-10-22 DIAGNOSIS — M545 Low back pain: Secondary | ICD-10-CM | POA: Diagnosis not present

## 2016-10-22 DIAGNOSIS — R109 Unspecified abdominal pain: Secondary | ICD-10-CM | POA: Diagnosis not present

## 2016-10-22 DIAGNOSIS — R4189 Other symptoms and signs involving cognitive functions and awareness: Secondary | ICD-10-CM | POA: Diagnosis not present

## 2016-10-22 DIAGNOSIS — M25552 Pain in left hip: Secondary | ICD-10-CM | POA: Diagnosis not present

## 2016-10-22 DIAGNOSIS — D696 Thrombocytopenia, unspecified: Secondary | ICD-10-CM | POA: Diagnosis not present

## 2016-10-22 DIAGNOSIS — S72012D Unspecified intracapsular fracture of left femur, subsequent encounter for closed fracture with routine healing: Secondary | ICD-10-CM | POA: Diagnosis not present

## 2016-10-22 DIAGNOSIS — Z9181 History of falling: Secondary | ICD-10-CM | POA: Diagnosis not present

## 2016-10-22 DIAGNOSIS — D62 Acute posthemorrhagic anemia: Secondary | ICD-10-CM | POA: Diagnosis not present

## 2016-10-22 DIAGNOSIS — F419 Anxiety disorder, unspecified: Secondary | ICD-10-CM | POA: Diagnosis not present

## 2016-10-22 DIAGNOSIS — F0280 Dementia in other diseases classified elsewhere without behavioral disturbance: Secondary | ICD-10-CM | POA: Diagnosis not present

## 2016-10-22 DIAGNOSIS — M79662 Pain in left lower leg: Secondary | ICD-10-CM | POA: Diagnosis not present

## 2016-10-22 DIAGNOSIS — M542 Cervicalgia: Secondary | ICD-10-CM | POA: Diagnosis not present

## 2016-10-22 DIAGNOSIS — R278 Other lack of coordination: Secondary | ICD-10-CM | POA: Diagnosis not present

## 2016-10-22 DIAGNOSIS — S72012S Unspecified intracapsular fracture of left femur, sequela: Secondary | ICD-10-CM | POA: Diagnosis not present

## 2016-10-22 DIAGNOSIS — F0391 Unspecified dementia with behavioral disturbance: Secondary | ICD-10-CM | POA: Diagnosis not present

## 2016-10-22 DIAGNOSIS — R1312 Dysphagia, oropharyngeal phase: Secondary | ICD-10-CM | POA: Diagnosis not present

## 2016-10-22 DIAGNOSIS — S72002D Fracture of unspecified part of neck of left femur, subsequent encounter for closed fracture with routine healing: Secondary | ICD-10-CM | POA: Diagnosis not present

## 2016-10-22 DIAGNOSIS — F329 Major depressive disorder, single episode, unspecified: Secondary | ICD-10-CM | POA: Diagnosis not present

## 2016-10-22 DIAGNOSIS — Z01818 Encounter for other preprocedural examination: Secondary | ICD-10-CM | POA: Diagnosis not present

## 2016-10-22 DIAGNOSIS — M25551 Pain in right hip: Secondary | ICD-10-CM | POA: Diagnosis present

## 2016-10-22 DIAGNOSIS — Z79899 Other long term (current) drug therapy: Secondary | ICD-10-CM | POA: Diagnosis not present

## 2016-10-22 DIAGNOSIS — F0390 Unspecified dementia without behavioral disturbance: Secondary | ICD-10-CM | POA: Diagnosis not present

## 2016-10-22 DIAGNOSIS — S728X9A Other fracture of unspecified femur, initial encounter for closed fracture: Secondary | ICD-10-CM | POA: Diagnosis not present

## 2016-10-22 DIAGNOSIS — R4182 Altered mental status, unspecified: Secondary | ICD-10-CM | POA: Diagnosis not present

## 2016-10-22 DIAGNOSIS — Z7409 Other reduced mobility: Secondary | ICD-10-CM | POA: Diagnosis not present

## 2016-10-22 DIAGNOSIS — M6281 Muscle weakness (generalized): Secondary | ICD-10-CM | POA: Diagnosis not present

## 2016-10-22 DIAGNOSIS — R2689 Other abnormalities of gait and mobility: Secondary | ICD-10-CM | POA: Diagnosis not present

## 2016-10-22 DIAGNOSIS — R32 Unspecified urinary incontinence: Secondary | ICD-10-CM | POA: Diagnosis not present

## 2016-10-22 DIAGNOSIS — Z7982 Long term (current) use of aspirin: Secondary | ICD-10-CM | POA: Diagnosis not present

## 2016-10-22 DIAGNOSIS — I10 Essential (primary) hypertension: Secondary | ICD-10-CM | POA: Diagnosis not present

## 2016-10-22 DIAGNOSIS — T148XXA Other injury of unspecified body region, initial encounter: Secondary | ICD-10-CM | POA: Diagnosis not present

## 2016-10-22 DIAGNOSIS — Z4789 Encounter for other orthopedic aftercare: Secondary | ICD-10-CM | POA: Diagnosis not present

## 2016-10-22 DIAGNOSIS — S79911A Unspecified injury of right hip, initial encounter: Secondary | ICD-10-CM | POA: Diagnosis not present

## 2016-10-22 DIAGNOSIS — S72009A Fracture of unspecified part of neck of unspecified femur, initial encounter for closed fracture: Secondary | ICD-10-CM | POA: Diagnosis not present

## 2016-10-22 DIAGNOSIS — Z87891 Personal history of nicotine dependence: Secondary | ICD-10-CM | POA: Diagnosis not present

## 2016-10-22 DIAGNOSIS — D649 Anemia, unspecified: Secondary | ICD-10-CM | POA: Diagnosis not present

## 2016-10-22 LAB — BASIC METABOLIC PANEL
Anion gap: 5 (ref 5–15)
BUN: 21 mg/dL — ABNORMAL HIGH (ref 6–20)
CALCIUM: 8.2 mg/dL — AB (ref 8.9–10.3)
CO2: 24 mmol/L (ref 22–32)
CREATININE: 0.73 mg/dL (ref 0.44–1.00)
Chloride: 111 mmol/L (ref 101–111)
GFR calc non Af Amer: >60 mL/min (ref >60)
Glucose, Bld: 103 mg/dL — ABNORMAL HIGH (ref 65–99)
Potassium: 4.2 mmol/L (ref 3.5–5.1)
SODIUM: 140 mmol/L (ref 135–145)

## 2016-10-22 LAB — CBC
HEMATOCRIT: 24.8 % — AB (ref 36.0–46.0)
HEMOGLOBIN: 8.2 g/dL — AB (ref 12.0–15.0)
MCH: 30.8 pg (ref 26.0–34.0)
MCHC: 33.1 g/dL (ref 30.0–36.0)
MCV: 93.2 fL (ref 78.0–100.0)
Platelets: 82 10*3/uL — ABNORMAL LOW (ref 150–400)
RBC: 2.66 MIL/uL — ABNORMAL LOW (ref 3.87–5.11)
RDW: 14.7 % (ref 11.5–15.5)
WBC: 6.3 10*3/uL (ref 4.0–10.5)

## 2016-10-22 MED ORDER — ALPRAZOLAM 0.25 MG PO TABS: 0.2500 mg | ORAL_TABLET | Freq: Two times a day (BID) | ORAL | 0 refills | Status: AC | PRN

## 2016-10-22 MED ORDER — ENSURE ENLIVE PO LIQD: 237.0000 mL | Freq: Every day | ORAL | Status: AC

## 2016-10-22 MED ORDER — ACETAMINOPHEN 325 MG PO TABS: 650.0000 mg | ORAL_TABLET | Freq: Four times a day (QID) | ORAL | Status: AC | PRN

## 2016-10-22 MED ORDER — HYDROCODONE-ACETAMINOPHEN 5-325 MG PO TABS: 1.0000 | ORAL_TABLET | Freq: Four times a day (QID) | ORAL | 0 refills | Status: DC | PRN

## 2016-10-22 MED ORDER — ASPIRIN 325 MG PO TBEC: 325.0000 mg | DELAYED_RELEASE_TABLET | Freq: Every day | ORAL | 0 refills | Status: DC

## 2016-10-22 NOTE — Social Work (Signed)
CSW spoke with daughter on bed offers and family has accepted [facility].  CSW spoke with admissions and they are in agreement and will await dc summary.  CSW will f/u on same and assist with transition to SNF today.  Keene BreathPatricia Eyob Godlewski, LCSW Clinical Social Worker [phone]

## 2016-10-22 NOTE — Discharge Instructions (Signed)
Partial weight bearing for transfers only Asa for dvt prophylaxis norco for pain   Additional discharge instructions:  Please get your medications reviewed and adjusted by your Primary MD.  Please request your Primary MD to go over all Hospital Tests and Procedure/Radiological results at the follow up, please get all Hospital records sent to your Prim MD by signing hospital release before you go home.  If you had Pneumonia of Lung problems at the Hospital: Please get a 2 view Chest X ray done in 6-8 weeks after hospital discharge or sooner if instructed by your Primary MD.  If you have Congestive Heart Failure: Please call your Cardiologist or Primary MD anytime you have any of the following symptoms:  1) 3 pound weight gain in 24 hours or 5 pounds in 1 week  2) shortness of breath, with or without a dry hacking cough  3) swelling in the hands, feet or stomach  4) if you have to sleep on extra pillows at night in order to breathe  Follow cardiac low salt diet and 1.5 lit/day fluid restriction.  If you have diabetes Accuchecks 4 times/day, Once in AM empty stomach and then before each meal. Log in all results and show them to your primary doctor at your next visit. If any glucose reading is under 80 or above 300 call your primary MD immediately.  If you have Seizure/Convulsions/Epilepsy: Please do not drive, operate heavy machinery, participate in activities at heights or participate in high speed sports until you have seen by Primary MD or a Neurologist and advised to do so again.  If you had Gastrointestinal Bleeding: Please ask your Primary MD to check a complete blood count within one week of discharge or at your next visit. Your endoscopic/colonoscopic biopsies that are pending at the time of discharge, will also need to followed by your Primary MD.  Get Medicines reviewed and adjusted. Please take all your medications with you for your next visit with your Primary MD  Please  request your Primary MD to go over all hospital tests and procedure/radiological results at the follow up, please ask your Primary MD to get all Hospital records sent to his/her office.  If you experience worsening of your admission symptoms, develop shortness of breath, life threatening emergency, suicidal or homicidal thoughts you must seek medical attention immediately by calling 911 or calling your MD immediately  if symptoms less severe.  You must read complete instructions/literature along with all the possible adverse reactions/side effects for all the Medicines you take and that have been prescribed to you. Take any new Medicines after you have completely understood and accpet all the possible adverse reactions/side effects.   Do not drive or operate heavy machinery when taking Pain medications.   Do not take more than prescribed Pain, Sleep and Anxiety Medications  Special Instructions: If you have smoked or chewed Tobacco  in the last 2 yrs please stop smoking, stop any regular Alcohol  and or any Recreational drug use.  Wear Seat belts while driving.  Please note You were cared for by a hospitalist during your hospital stay. If you have any questions about your discharge medications or the care you received while you were in the hospital after you are discharged, you can call the unit and asked to speak with the hospitalist on call if the hospitalist that took care of you is not available. Once you are discharged, your primary care physician will handle any further medical issues. Please note that  NO REFILLS for any discharge medications will be authorized once you are discharged, as it is imperative that you return to your primary care physician (or establish a relationship with a primary care physician if you do not have one) for your aftercare needs so that they can reassess your need for medications and monitor your lab values.  You can reach the hospitalist office at phone  [phone] or fax [phone]   If you do not have a primary care physician, you can call [phone] for a physician referral.

## 2016-10-22 NOTE — Clinical Social Work Placement (Signed)
   CLINICAL SOCIAL WORK PLACEMENT  NOTE  Date:  10/22/2016  Patient Details  Name: Yolanda Shaw MRN: [id number] Date of Birth: 01/08/1931  Clinical Social Work is seeking post-discharge placement for this patient at the Skilled  Nursing Facility level of care (*CSW will initial, date and re-position this form in  chart as items are completed):  Yes   Patient/family provided with [place] [facility] Clinical Social Work Department's list of facilities offering this level of care within the geographic area requested by the patient (or if unable, by the patient's family).  Yes   Patient/family informed of their freedom to choose among providers that offer the needed level of care, that participate in Medicare, Medicaid or managed care program needed by the patient, have an available bed and are willing to accept the patient.  Yes   Patient/family informed of [facility]'s ownership interest in [facility] and [facility], as well as of the fact that they are under no obligation to receive care at these facilities.  PASRR submitted to EDS on 10/21/16     PASRR number received on       Existing PASRR number confirmed on 10/21/16     FL2 transmitted to all facilities in geographic area requested by pt/family on       FL2 transmitted to all facilities within larger geographic area on       Patient informed that his/her managed care company has contracts with or will negotiate with certain facilities, including the following:        Yes   Patient/family informed of bed offers received.  [facility]     Physician recommends and patient chooses bed at      [facility] to be [facility] 10/22/16.  Patient to be transferred to facility by [facility]     Patient family notified on 10/22/16 of transfer.  Name of family member notified:  son at bedside, daughter called on phone     PHYSICIAN Please prepare prescriptions, Please prepare priority discharge  summary, including medications     Additional Comment:    _______________________________________________ Tresa MoorePatricia V Aviana Shevlin, LCSW 10/22/2016, 12:15 PM

## 2016-10-22 NOTE — Progress Notes (Signed)
Physical Therapy Treatment Patient Details Name: Yolanda CarwinLoretta Shaw MRN: [id number] DOB: 02/19/1931 Today's Date: 10/22/2016    History of Present Illness  Yolanda Shaw is an ambulatory 83 year old female who sustained a mechanical fall yesterday.  She sustained an intertrochanteric fracture of the left hip. She is s/p IM hip screw.  PMH positive for dementia, bilat pubic rami fx 2016 & 2018, AKI.    PT Comments    Pt pleasantly confused and able to follow single step commands at times. Pt maintains flexed posture, poor balance and need for max +2 assist with all mobility.  Did not attempt OOB today as pt awaiting transfer to SNF. Pt educated for HEP and transfers. Will continue to follow.    Follow Up Recommendations  SNF;Supervision/Assistance - 24 hour     Equipment Recommendations       Recommendations for Other Services       Precautions / Restrictions Precautions Precautions: Fall Restrictions LLE Weight Bearing: Non weight bearing    Mobility  Bed Mobility Overal bed mobility: Needs Assistance Bed Mobility: Supine to Sit;Sit to Supine     Supine to sit: Max assist;+2 for physical assistance Sit to supine: Max assist;+2 for physical assistance   General bed mobility comments: max assist to elevate trunk and sequentially move legs to EOB. Max assist to pivot back into bed. Pt with total assist to slide up in bed with +2  Transfers                 General transfer comment: did not attempt  Ambulation/Gait                 Stairs            Wheelchair Mobility    Modified Rankin (Stroke Patients Only)       Balance Overall balance assessment: Needs assistance   Sitting balance-Leahy Scale: Poor Sitting balance - Comments: pt with extreme kyphosis and unable to sit upright. pt able to maintain EOB with minguard-mod assist 6 min with assist dependent on perturbation or activity   Standing balance support: Bilateral upper extremity supported                                 Cognition Arousal/Alertness: Awake/alert Behavior During Therapy: WFL for tasks assessed/performed Overall Cognitive Status: No family/caregiver present to determine baseline cognitive functioning Area of Impairment: Orientation;Attention;Memory;Safety/judgement;Following commands                 Orientation Level: Disoriented to;Time;Situation;Place Current Attention Level: Focused Memory: Decreased short-term memory Following Commands: Follows one step commands with increased time;Follows one step commands inconsistently Safety/Judgement: Decreased awareness of safety;Decreased awareness of deficits   Problem Solving: Requires verbal cues;Requires tactile cues;Difficulty sequencing;Slow processing;Decreased initiation        Exercises General Exercises - Lower Extremity Long Arc Quad: AROM;AAROM;Right;Left;Seated;10 reps (AAROM on LLE) Hip ABduction/ADduction: AROM;AAROM;Right;Left;Supine;10 reps (AAROM on LLE) Hip Flexion/Marching: AROM;AAROM;Right;Left;Seated;10 reps (AAROM on LLE)    General Comments        Pertinent Vitals/Pain Pain Assessment:  (PAINAD= 1)    Home Living                      Prior Function            PT Goals (current goals can now be found in the care plan section) Progress towards PT goals: Not progressing toward goals - comment  Frequency    Min 3X/week      PT Plan Current plan remains appropriate    Co-evaluation              AM-PAC PT "6 Clicks" Daily Activity  Outcome Measure  Difficulty turning over in bed (including adjusting bedclothes, sheets and blankets)?: Unable Difficulty moving from lying on back to sitting on the side of the bed? : Unable Difficulty sitting down on and standing up from a chair with arms (e.g., wheelchair, bedside commode, etc,.)?: Unable Help needed moving to and from a bed to chair (including a wheelchair)?: Total Help needed walking in  hospital room?: Total Help needed climbing 3-5 steps with a railing? : Total 6 Click Score: 6    End of Session   Activity Tolerance: Patient tolerated treatment well Patient left: in bed;with call bell/phone within reach Nurse Communication: Mobility status;Need for lift equipment PT Visit Diagnosis: History of falling (Z91.81);Pain;Difficulty in walking, not elsewhere classified (R26.2);Muscle weakness (generalized) (M62.81)     Time: 1610-9604 PT Time Calculation (min) (ACUTE ONLY): 24 min  Charges:  $Therapeutic Exercise: 8-22 mins $Therapeutic Activity: 8-22 mins                    G Codes:       Delaney Meigs, PT [phone]    Renelda Kilian B Anikin Prosser 10/22/2016, 2:49 PM

## 2016-10-22 NOTE — Progress Notes (Signed)
Pt stable Dressings ok Ready for snf pwb for transfers only Asa for dvt prophylaxis

## 2016-10-22 NOTE — Social Work (Signed)
Clinical Social Worker facilitated patient discharge including contacting patient family and facility to confirm patient discharge plans.  Clinical information faxed to facility and family agreeable with plan.    CSW arranged ambulance transport via [facility] to [facility].    RN to call [phone] to give report prior to discharge.  Clinical Social Worker will sign off for now as social work intervention is no longer needed. Please consult us again if new need arises.  Issac Moure, LCSW Clinical Social Worker [phone]      

## 2016-10-22 NOTE — Discharge Summary (Addendum)
Physician Discharge Summary  Yolanda Shaw WUJ:[id number] DOB: 05/04/1931  PCP: Georgann HousekeeperHusain, Karrar, MD  Admit date: 10/18/2016 Discharge date: 10/22/2016  Recommendations for Outpatient Follow-up:  1. M.D. at SNF in 2 days with repeat labs (CBC & BMP). 2. Dr. Cammy CopaGregory Scott Dean, Orthopedics in 1 week for postop follow-up. 3. Dr. Georgann HousekeeperKarrar Husain, PCP upon discharge from SNF.  Home Health: None Equipment/Devices: None    Discharge Condition: Improved and stable  CODE STATUS: Full  Diet recommendation: Heart healthy diet.  Discharge Diagnoses:  Principal Problem:   Closed left hip fracture, initial encounter (HCC) Active Problems:   Dementia with behavioral disturbance   Hip fracture (HCC)   Acute blood loss anemia   Brief Summary: 83 year old female with PMH of dementia, HTN, presented to ED after a fall at home and persistent left hip pain. She reportedly had taken her Xanax and tried to walk when she fell. Diagnosed with left hip fracture, status post surgical fixation on 10/15.    Assessment & Plan:   Left hip fracture Sustained status post mechanical fall. Fall may be complicated by her advanced age, dementia, Xanax. Orthopedics consulted and patient is status post IM nail on 10/19/16. Orthopedics follow-up appreciated and recommend partial weightbearing for transfers only, aspirin for DVT prophylaxis and cleared for discharge to SNF. Outpatient follow-up with orthopedics.  Pubic rami fractures  - Per imaging, chronic bilateral inferior pubic rami fractures and new (since 2017) appearing right superior pubic ramus fracture  Dementia with behavioral disturbance Mental status probably at baseline. Try to avoid benzodiazepine or sedating medications to prevent falls. I discussed with patient's daughter who indicates that patient lives with her brother Yolanda Shaw. She did confirm that patient uses when necessary Xanax for extreme anxiety and the give it to her only infrequently and as  needed. She is on no injectable medications at home.  Postop acute blood loss anemia Expected postop acute blood loss anemia. Hemoglobin preoperatively was 13.3. This has gradually drifted down to 8.2 but less acutely since yesterday when it was 8.9. Follow CBC closely at SNF and transfuse if hemoglobin <7 g per DL. Some of her anemia may be dilutional related to IV fluids. IV fluids stopped at discharge.  Thrombocytopenia Likely due to acute blood loss. This has been fluctuating. Follow CBCs closely at SNF.  Prediabetes - A1c 5.7.  Sinus tachycardia - May be related to pain and some volume depletion. Brief IV fluids. Improved.   Consultants:  Orthopedics   Procedures:  Left hip fracture surgery with intramedullary nail on 10/15.   Discharge Instructions  Discharge Instructions    Call MD for:  difficulty breathing, headache or visual disturbances    Complete by:  As directed    Call MD for:  extreme fatigue    Complete by:  As directed    Call MD for:  persistant dizziness or light-headedness    Complete by:  As directed    Call MD for:  persistant nausea and vomiting    Complete by:  As directed    Call MD for:  redness, tenderness, or signs of infection (pain, swelling, redness, odor or green/yellow discharge around incision site)    Complete by:  As directed    Call MD for:  severe uncontrolled pain    Complete by:  As directed    Call MD for:  temperature >100.4    Complete by:  As directed    Diet - low sodium heart healthy    Complete by:  As directed    Discharge instructions    Complete by:  As directed    Acetaminophen dose from all sources not to exceed 3 g per day.   Increase activity slowly    Complete by:  As directed        Medication List    STOP taking these medications   haloperidol lactate 5 MG/ML injection Commonly known as:  HALDOL     TAKE these medications   acetaminophen 325 MG tablet Commonly known as:  TYLENOL Take 2 tablets  (650 mg total) by mouth every 6 (six) hours as needed for mild pain or fever. What changed:  when to take this  reasons to take this   ALPRAZolam 0.25 MG tablet Commonly known as:  XANAX Take 1 tablet (0.25 mg total) by mouth 2 (two) times daily as needed for anxiety.   aspirin 325 MG EC tablet Take 1 tablet (325 mg total) by mouth daily with breakfast.   feeding supplement (ENSURE ENLIVE) Liqd Take 237 mLs by mouth daily at 3 pm.   HYDROcodone-acetaminophen 5-325 MG tablet Commonly known as:  NORCO/VICODIN Take 1 tablet by mouth every 6 (six) hours as needed for moderate pain or severe pain. What changed:  reasons to take this   memantine 10 MG tablet Commonly known as:  NAMENDA Take 10 mg by mouth 2 (two) times daily.   senna 8.6 MG Tabs tablet Commonly known as:  SENOKOT Take 1 tablet (8.6 mg total) by mouth daily. What changed:  when to take this  reasons to take this       Contact information for follow-up providers    Georgann Housekeeper, MD. Schedule an appointment as soon as possible for a visit.   Specialty:  Internal Medicine Why:  Upon discharge from SNF. Contact information: [address] [place] [address] [phone]        Cammy Copa, MD. Schedule an appointment as soon as possible for a visit in 1 week(s).   Specialty:  Orthopedic Surgery Contact information: [address] [place] [address] [phone]        M.D. at SNF. Schedule an appointment as soon as possible for a visit in 2 day(s).   Why:  To be seen with repeat labs (CBC & BMP).           Contact information for after-discharge care    Destination    [facility] Follow up.   Specialty:  Skilled Nursing Facility Contact information: [address] [place] [address] [phone]                 Allergies  Allergen Reactions  . Ether Other (See Comments)    Hard to wake up after procedure   . Amitriptyline Rash  . Penicillins Rash    Has patient had a PCN reaction causing immediate rash, facial/tongue/throat swelling, SOB or lightheadedness with hypotension: No Has patient had a PCN reaction causing severe rash involving mucus membranes or skin necrosis: No Has patient had a PCN reaction that required hospitalization No Has patient had a PCN reaction occurring within the last 10 years: No If all of the above answers are "NO", then may proceed with Cephalosporin use.      Procedures/Studies: Dg Chest 1 View  Result Date: 10/18/2016 CLINICAL DATA:  Left hip fracture EXAM: CHEST 1 VIEW COMPARISON:  01/03/2013 FINDINGS: Shallow inspiration. No airspace consolidation. No pneumothorax or significant effusion. Moderate aortic tortuosity,  unchanged allowing for differences in projection. Normal pulmonary vasculature. IMPRESSION: No acute cardiopulmonary findings. Electronically Signed   By: Ellery Plunk M.D.   On: 10/18/2016 23:26   Ct Head Wo Contrast  Result Date: 10/19/2016 CLINICAL DATA:  Fall.  Head and neck pain. EXAM: CT HEAD WITHOUT CONTRAST CT CERVICAL SPINE WITHOUT CONTRAST TECHNIQUE: Multidetector CT imaging of the head and cervical spine was performed following the standard protocol without intravenous contrast. Multiplanar CT image reconstructions of the cervical spine were also generated. COMPARISON:  02/17/2016 FINDINGS: CT HEAD FINDINGS Brain: No evidence of acute infarction, hemorrhage, hydrocephalus, extra-axial collection or mass lesion/mass effect. Diffuse cerebral atrophy. Ventricular dilatation consistent with central atrophy. Low-attenuation changes in the deep white matter consistent with small vessel ischemia. Vascular: No hyperdense vessel or unexpected calcification. Skull: Calvarium appears intact. Sinuses/Orbits: Visualized paranasal sinuses and mastoid air cells are not opacified. Other: Small subcutaneous scalp lesion over the right anterior frontal  region may represent a small hematoma or sebaceous cyst. CT CERVICAL SPINE FINDINGS Alignment: Normal alignment of the cervical spine. C1-2 articulation appears intact. Skull base and vertebrae: There is anterior compression of the T6 vertebral body. No prior comparison studies of this area are available for correlation but other old compression deformities are present in this likely represents chronic compression. No definite evidence of any acute cortical irregularities. Mild chronic compression of the superior endplate of T1. No cervical compression deformities identified. No destructive bone lesions. Soft tissues and spinal canal: No prevertebral soft tissue swelling. No paraspinal mass or soft tissue infiltration. Disc levels: Mild degenerative changes diffusely throughout the cervical spine with narrowed interspaces and endplate hypertrophic changes. Prominent degenerative changes at C1-2. Upper chest: Emphysematous changes in the lung apices. Thyroid gland appears mildly enlarged, likely representing thyroid goiter. No change since previous study. Other: None. IMPRESSION: 1. No acute intracranial abnormalities. Chronic atrophy and small vessel ischemic changes. 2. Normal alignment of the cervical spine. Degenerative changes. No acute displaced cervical spine fractures identified. 3. Compression deformities at T1 and T6 are likely old. Electronically Signed   By: Burman Nieves M.D.   On: 10/19/2016 02:33   Ct Cervical Spine Wo Contrast  Result Date: 10/19/2016 CLINICAL DATA:  Fall.  Head and neck pain. EXAM: CT HEAD WITHOUT CONTRAST CT CERVICAL SPINE WITHOUT CONTRAST TECHNIQUE: Multidetector CT imaging of the head and cervical spine was performed following the standard protocol without intravenous contrast. Multiplanar CT image reconstructions of the cervical spine were also generated. COMPARISON:  02/17/2016 FINDINGS: CT HEAD FINDINGS Brain: No evidence of acute infarction, hemorrhage, hydrocephalus,  extra-axial collection or mass lesion/mass effect. Diffuse cerebral atrophy. Ventricular dilatation consistent with central atrophy. Low-attenuation changes in the deep white matter consistent with small vessel ischemia. Vascular: No hyperdense vessel or unexpected calcification. Skull: Calvarium appears intact. Sinuses/Orbits: Visualized paranasal sinuses and mastoid air cells are not opacified. Other: Small subcutaneous scalp lesion over the right anterior frontal region may represent a small hematoma or sebaceous cyst. CT CERVICAL SPINE FINDINGS Alignment: Normal alignment of the cervical spine. C1-2 articulation appears intact. Skull base and vertebrae: There is anterior compression of the T6 vertebral body. No prior comparison studies of this area are available for correlation but other old compression deformities are present in this likely represents chronic compression. No definite evidence of any acute cortical irregularities. Mild chronic compression of the superior endplate of T1. No cervical compression deformities identified. No destructive bone lesions. Soft tissues and spinal canal: No prevertebral soft tissue swelling. No paraspinal mass  or soft tissue infiltration. Disc levels: Mild degenerative changes diffusely throughout the cervical spine with narrowed interspaces and endplate hypertrophic changes. Prominent degenerative changes at C1-2. Upper chest: Emphysematous changes in the lung apices. Thyroid gland appears mildly enlarged, likely representing thyroid goiter. No change since previous study. Other: None. IMPRESSION: 1. No acute intracranial abnormalities. Chronic atrophy and small vessel ischemic changes. 2. Normal alignment of the cervical spine. Degenerative changes. No acute displaced cervical spine fractures identified. 3. Compression deformities at T1 and T6 are likely old. Electronically Signed   By: Burman Nieves M.D.   On: 10/19/2016 02:33   Pelvis Portable  Result Date:  10/20/2016 CLINICAL DATA:  Left intramedullary nail fixation of femoral fracture. EXAM: PORTABLE PELVIS 1-2 VIEWS COMPARISON:  Intraoperative images from earlier the same day. 01/01/2016 CT FINDINGS: Long-stem left femoral nail fixation across an intertrochanteric fracture of the left femur with avulsed lesser trochanter. Postoperative subcutaneous emphysema seen along the lateral aspect of the left thigh. Bilateral inferior pubic rami fractures with displaced appearing right superior pubic ramus fracture relative to prior CT from 01/01/2016. Osteoarthritic joint space narrowing of both hips. IMPRESSION: 1. Intact long-stem femoral nail fixation of a left femoral intertrochanteric fracture with avulsed lesser trochanter. 2. Chronic bilateral inferior pubic rami fractures with new (since 2017) appearing right superior pubic ramus fracture. Electronically Signed   By: Tollie Eth M.D.   On: 10/20/2016 00:45   Dg Shoulder Left  Result Date: 10/19/2016 CLINICAL DATA:  Initial evaluation for acute trauma, fall, left shoulder pain. EXAM: LEFT SHOULDER - 2+ VIEW COMPARISON:  None. FINDINGS: Bones are diffusely osteopenic. No acute fracture or dislocation. Humeral head in normal line with the glenoid. AC joint approximated. Dystrophic soft tissue calcification noted inferior to the humeral head. No acute soft tissue abnormality. Visualized left hemithorax grossly clear. IMPRESSION: 1. No acute osseous abnormality about the left shoulder. 2. Osteopenia. Electronically Signed   By: Rise Mu M.D.   On: 10/19/2016 00:30   Dg C-arm 1-60 Min  Result Date: 10/20/2016 CLINICAL DATA:  Left intramedullary nail. EXAM: LEFT FEMUR 2 VIEWS; DG C-ARM 61-120 MIN COMPARISON:  Left hip radiographs 10/18/2016 FINDINGS: Intraoperative fluoroscopy was obtained for surgical control purposes. Fluoroscopy time is recorded at 1 minute 53 seconds. Four spot fluoroscopic images were obtained. Images demonstrate interval  placement of long intramedullary rod with proximal compression screws across stone inter trochanteric fracture of the proximal left femur. Improved alignment of the fracture fragments post fixation. There is still residual displacement of a lesser trochanteric fragment. A locking screw is present in the distal aspect of the intramedullary rod. Degenerative changes are suggested in the left knee. IMPRESSION: Intraoperative fluoroscopy for surgical control purposes demonstrating intramedullary rod and compression screw fixation across stone inter trochanteric fracture of the proximal left femur. Improved position of fracture fragments with residual displacement of a lesser trochanteric fragment. Electronically Signed   By: Burman Nieves M.D.   On: 10/20/2016 00:01   Dg Hip Unilat With Pelvis 2-3 Views Left  Result Date: 10/18/2016 CLINICAL DATA:  Larey Seat this evening. EXAM: DG HIP (WITH OR WITHOUT PELVIS) 2-3V LEFT COMPARISON:  None. FINDINGS: There is an intertrochanteric left hip fracture with varus angulation. No dislocation. No radiographic findings to suggest a pathologic basis for the fracture. Healed pubic ramus fractures are present bilaterally. IMPRESSION: Intertrochanteric left hip fracture. Electronically Signed   By: Ellery Plunk M.D.   On: 10/18/2016 23:25   Dg Femur Min 2 Views Left  Result Date:  10/20/2016 CLINICAL DATA:  Left intramedullary nail. EXAM: LEFT FEMUR 2 VIEWS; DG C-ARM 61-120 MIN COMPARISON:  Left hip radiographs 10/18/2016 FINDINGS: Intraoperative fluoroscopy was obtained for surgical control purposes. Fluoroscopy time is recorded at 1 minute 53 seconds. Four spot fluoroscopic images were obtained. Images demonstrate interval placement of long intramedullary rod with proximal compression screws across stone inter trochanteric fracture of the proximal left femur. Improved alignment of the fracture fragments post fixation. There is still residual displacement of a lesser  trochanteric fragment. A locking screw is present in the distal aspect of the intramedullary rod. Degenerative changes are suggested in the left knee. IMPRESSION: Intraoperative fluoroscopy for surgical control purposes demonstrating intramedullary rod and compression screw fixation across stone inter trochanteric fracture of the proximal left femur. Improved position of fracture fragments with residual displacement of a lesser trochanteric fragment. Electronically Signed   By: Burman Nieves M.D.   On: 10/20/2016 00:01      Subjective: Pleasantly confused. Oriented only to self. Does not know that she is in the hospital. Does not remember that she fell and sustained a hip fracture and had surgery for same. Denies pain. As per RN, no acute issues reported.  Discharge Exam:  Vitals:   10/21/16 0534 10/21/16 1300 10/21/16 1950 10/22/16 0700  BP: (!) 151/68 132/62 134/67 139/84  Pulse: (!) 116 (!) 109 (!) 102 97  Resp: 16 16 15 15   Temp: 98.4 F (36.9 C) 99.1 F (37.3 C) 98.3 F (36.8 C) 98.3 F (36.8 C)  TempSrc: Oral Oral Oral Oral  SpO2: 93% 93% 95% 96%    General exam: Elderly female, moderately built and frail, lying comfortably propped up in bed. Respiratory system: Clear to auscultation. Respiratory effort normal. Cardiovascular system: S1 & S2 heard, RRR. No JVD, murmurs, rubs, gallops or clicks. No pedal edema. Gastrointestinal system: Abdomen is nondistended, soft and nontender. No organomegaly or masses felt. Normal bowel sounds heard. Central nervous system: Alert and oriented only to self. No focal neurological deficits.Follows simple instructions. Extremities: Symmetric 5 x 5 power except left lower extremity where movements restricted by pain. Left hip postop dressing site clean and dry. Skin: No rashes, lesions or ulcers Psychiatry: Judgement and insight impaired. Mood & affect confused.   The results of significant diagnostics from this hospitalization (including  imaging, microbiology, ancillary and laboratory) are listed below for reference.     Microbiology: Recent Results (from the past 240 hour(s))  MRSA PCR Screening     Status: None   Collection Time: 10/19/16  1:25 PM  Result Value Ref Range Status   MRSA by PCR NEGATIVE NEGATIVE Final    Comment:        The GeneXpert MRSA Assay (FDA approved for NASAL specimens only), is one component of a comprehensive MRSA colonization surveillance program. It is not intended to diagnose MRSA infection nor to guide or monitor treatment for MRSA infections.      Labs: CBC:  Recent Labs Lab 10/19/16 0036 10/19/16 0420 10/20/16 0625 10/21/16 0521 10/22/16 0410  WBC 16.7* 15.7* 10.7* 10.4 6.3  NEUTROABS 14.9*  --   --   --   --   HGB 14.4 13.3 10.4* 8.9* 8.2*  HCT 42.9 41.1 31.6* 26.2* 24.8*  MCV 93.7 94.5 93.2 92.3 93.2  PLT 135* 123* 88* 93* 82*   Basic Metabolic Panel:  Recent Labs Lab 10/19/16 0036 10/19/16 0420 10/20/16 0625 10/22/16 0410  NA 141 141 142 140  K 4.4 4.6 4.8 4.2  CL  110 111 112* 111  CO2 21* 21* 22 24  GLUCOSE 167* 171* 164* 103*  BUN 24* 27* 27* 21*  CREATININE 1.00 1.10* 0.96 0.73  CALCIUM 8.9 8.8* 8.6* 8.2*   CBG:  Recent Labs Lab 10/19/16 1222 10/19/16 1542  GLUCAP 114* 126*   Discussed in detail with patient's daughter. Updated care and answered questions.    Time coordinating discharge: Over 30 minutes  SIGNED:  Marcellus Scott, MD, FACP, FHM. [facility] Pager [phone]  If 7PM-7AM, please contact night-coverage [email or web address] Password [email or web address] 10/22/2016, 2:18 PM

## 2016-10-23 ENCOUNTER — Telehealth (INDEPENDENT_AMBULATORY_CARE_PROVIDER_SITE_OTHER): Payer: Self-pay | Admitting: Radiology

## 2016-10-23 DIAGNOSIS — R32 Unspecified urinary incontinence: Secondary | ICD-10-CM | POA: Diagnosis not present

## 2016-10-23 DIAGNOSIS — Z7409 Other reduced mobility: Secondary | ICD-10-CM | POA: Diagnosis not present

## 2016-10-23 DIAGNOSIS — M25552 Pain in left hip: Secondary | ICD-10-CM | POA: Diagnosis not present

## 2016-10-23 DIAGNOSIS — S72002A Fracture of unspecified part of neck of left femur, initial encounter for closed fracture: Secondary | ICD-10-CM | POA: Diagnosis not present

## 2016-10-23 DIAGNOSIS — Z9181 History of falling: Secondary | ICD-10-CM | POA: Diagnosis not present

## 2016-10-23 DIAGNOSIS — R4189 Other symptoms and signs involving cognitive functions and awareness: Secondary | ICD-10-CM | POA: Diagnosis not present

## 2016-10-23 DIAGNOSIS — F0280 Dementia in other diseases classified elsewhere without behavioral disturbance: Secondary | ICD-10-CM | POA: Diagnosis not present

## 2016-10-23 NOTE — Telephone Encounter (Signed)
Gregary SignsSean, PT at [facility] needs to know patient's current weight bearing status. Please call him, thanks.

## 2016-10-23 NOTE — Telephone Encounter (Signed)
Please advise 

## 2016-10-23 NOTE — Telephone Encounter (Signed)
Sean at [facility] [phone]

## 2016-10-23 NOTE — Telephone Encounter (Signed)
What is number

## 2016-10-23 NOTE — Telephone Encounter (Signed)
I called.

## 2016-10-27 DIAGNOSIS — F0280 Dementia in other diseases classified elsewhere without behavioral disturbance: Secondary | ICD-10-CM | POA: Diagnosis not present

## 2016-10-27 DIAGNOSIS — M25552 Pain in left hip: Secondary | ICD-10-CM | POA: Diagnosis not present

## 2016-10-27 DIAGNOSIS — Z9181 History of falling: Secondary | ICD-10-CM | POA: Diagnosis not present

## 2016-10-29 DIAGNOSIS — R32 Unspecified urinary incontinence: Secondary | ICD-10-CM | POA: Diagnosis not present

## 2016-10-29 DIAGNOSIS — F0280 Dementia in other diseases classified elsewhere without behavioral disturbance: Secondary | ICD-10-CM | POA: Diagnosis not present

## 2016-10-29 DIAGNOSIS — Z7409 Other reduced mobility: Secondary | ICD-10-CM | POA: Diagnosis not present

## 2016-10-29 DIAGNOSIS — Z9181 History of falling: Secondary | ICD-10-CM | POA: Diagnosis not present

## 2016-10-29 DIAGNOSIS — M25552 Pain in left hip: Secondary | ICD-10-CM | POA: Diagnosis not present

## 2016-10-29 DIAGNOSIS — S72002A Fracture of unspecified part of neck of left femur, initial encounter for closed fracture: Secondary | ICD-10-CM | POA: Diagnosis not present

## 2016-10-29 DIAGNOSIS — R4189 Other symptoms and signs involving cognitive functions and awareness: Secondary | ICD-10-CM | POA: Diagnosis not present

## 2016-11-03 DIAGNOSIS — M25552 Pain in left hip: Secondary | ICD-10-CM | POA: Diagnosis not present

## 2016-11-03 DIAGNOSIS — F0280 Dementia in other diseases classified elsewhere without behavioral disturbance: Secondary | ICD-10-CM | POA: Diagnosis not present

## 2016-11-03 DIAGNOSIS — Z9181 History of falling: Secondary | ICD-10-CM | POA: Diagnosis not present

## 2016-11-04 ENCOUNTER — Ambulatory Visit (INDEPENDENT_AMBULATORY_CARE_PROVIDER_SITE_OTHER): Payer: Medicare Other | Admitting: Orthopedic Surgery

## 2016-11-05 ENCOUNTER — Ambulatory Visit (INDEPENDENT_AMBULATORY_CARE_PROVIDER_SITE_OTHER): Payer: Medicare Other | Admitting: Orthopedic Surgery

## 2016-11-06 DIAGNOSIS — Z7409 Other reduced mobility: Secondary | ICD-10-CM | POA: Diagnosis not present

## 2016-11-06 DIAGNOSIS — R32 Unspecified urinary incontinence: Secondary | ICD-10-CM | POA: Diagnosis not present

## 2016-11-06 DIAGNOSIS — S72002A Fracture of unspecified part of neck of left femur, initial encounter for closed fracture: Secondary | ICD-10-CM | POA: Diagnosis not present

## 2016-11-06 DIAGNOSIS — R4189 Other symptoms and signs involving cognitive functions and awareness: Secondary | ICD-10-CM | POA: Diagnosis not present

## 2016-11-10 ENCOUNTER — Encounter (INDEPENDENT_AMBULATORY_CARE_PROVIDER_SITE_OTHER): Payer: Self-pay | Admitting: Orthopedic Surgery

## 2016-11-10 ENCOUNTER — Ambulatory Visit (INDEPENDENT_AMBULATORY_CARE_PROVIDER_SITE_OTHER): Payer: No Typology Code available for payment source

## 2016-11-10 ENCOUNTER — Ambulatory Visit (INDEPENDENT_AMBULATORY_CARE_PROVIDER_SITE_OTHER): Payer: Medicare Other | Admitting: Orthopedic Surgery

## 2016-11-10 DIAGNOSIS — S72002A Fracture of unspecified part of neck of left femur, initial encounter for closed fracture: Secondary | ICD-10-CM

## 2016-11-10 NOTE — Progress Notes (Signed)
Post-Op Visit Note   Patient: Yolanda Shaw           Date of Birth: 10/31/1931           MRN: [id number] Visit Date: 11/10/2016 PCP: Georgann HousekeeperHusain, Karrar, MD   Assessment & Plan:  Chief Complaint:  Chief Complaint  Patient presents with  . Left Hip - Routine Post Op   Visit Diagnoses:  1. Closed left hip fracture, initial encounter [facility](HCC)     Plan: Yolanda Shaw is now 3 weeks out left hip intertrochanteric fracture fixation.  On exam patient has some pain mostly around the knee with range of motion.  Not much in way of hip pain with range of motion.  No real calf tenderness on either leg.  Radiographs show not much in way of callus formation and the fracture site but there hasn't been much in way of alignment change of the fracture.  Plan is for touchdown weightbearing 3 more weeks then return in 3 weeks for repeat radiographs.  Concern at this time would be that the fracture if it doesn't collapse in heel may end up going into varus.  Follow-Up Instructions: Return in about 4 weeks (around 12/08/2016).   Orders:  Orders Placed This Encounter  Procedures  . XR FEMUR MIN 2 VIEWS LEFT   No orders of the defined types were placed in this encounter.   Imaging: Xr Femur Min 2 Views Left  Result Date: 11/10/2016 AP lateral left femur reviewed.  Intramedullary hip screw has been placed for intertrochanteric fracture.  There is been minimal alignment change compared to radiographs performed 3 weeks ago.  Not much in way of callus formation is noted.  There has been proximal migration of the lesser trochanter   PMFS History: Patient Active Problem List   Diagnosis Date Noted  . Acute blood loss anemia   . Closed left hip fracture, initial encounter (HCC) 10/19/2016  . Hip fracture (HCC) 10/19/2016  . Anemia 03/02/2016  . Closed stable fracture of multiple pubic rami (HCC) 02/17/2016  . Closed fracture of right proximal humerus   . Essential hypertension   . AKI (acute kidney injury) (HCC)    . Dementia with behavioral disturbance   . Pelvic fracture (HCC) 06/30/2014  . Thrombocytopenia (HCC) 06/30/2014  . Tremor 06/30/2014  . Pressure ulcer 06/30/2014  . Fracture of left pubis (HCC)   . Ischium fracture (HCC)   . Left hip pain   . Bilateral pubic rami fractures (HCC) 06/29/2014  . Atrial fibrillation (HCC) 03/02/2014   Past Medical History:  Diagnosis Date  . AKI (acute kidney injury) (HCC)   . Anemia    low iron  . Anxiety    panic attacks  . Arthritis   . Bilateral pubic rami fractures (HCC) 06/29/2014  . Closed fracture of right proximal humerus   . Closed stable fracture of multiple pubic rami (HCC) 02/17/2016  . Dementia   . Dementia with behavioral disturbance   . Essential hypertension   . Fracture of left pubis (HCC)   . Ischium fracture (HCC)   . Pelvic fracture (HCC) 06/30/2014  . Pressure ulcer 06/30/2014  . Thrombocytopenia (HCC) 06/30/2014  . Tremor 06/30/2014    Family History  Problem Relation Age of Onset  . Hypertension Other     Past Surgical History:  Procedure Laterality Date  . ADENOIDECTOMY    . SHOULDER SURGERY     right  . TONSILLECTOMY    . WRIST SURGERY  Social History   Occupational History  . Occupation: retired Psychologist, forensiclegal secretary   Tobacco Use  . Smoking status: Former Smoker    Types: Cigarettes    Last attempt to quit: 01/01/1947    Years since quitting: 69.9  . Smokeless tobacco: Never Used  Substance and Sexual Activity  . Alcohol use: Yes    Comment: occassional wine  . Drug use: No  . Sexual activity: No

## 2016-11-11 DIAGNOSIS — M25552 Pain in left hip: Secondary | ICD-10-CM | POA: Diagnosis not present

## 2016-11-13 DIAGNOSIS — M25552 Pain in left hip: Secondary | ICD-10-CM | POA: Diagnosis not present

## 2016-11-17 DIAGNOSIS — M25552 Pain in left hip: Secondary | ICD-10-CM | POA: Diagnosis not present

## 2016-11-19 DIAGNOSIS — M25552 Pain in left hip: Secondary | ICD-10-CM | POA: Diagnosis not present

## 2016-11-20 DIAGNOSIS — F0391 Unspecified dementia with behavioral disturbance: Secondary | ICD-10-CM | POA: Diagnosis not present

## 2016-11-20 DIAGNOSIS — Z7409 Other reduced mobility: Secondary | ICD-10-CM | POA: Diagnosis not present

## 2016-11-20 DIAGNOSIS — R32 Unspecified urinary incontinence: Secondary | ICD-10-CM | POA: Diagnosis not present

## 2016-11-20 DIAGNOSIS — S72002A Fracture of unspecified part of neck of left femur, initial encounter for closed fracture: Secondary | ICD-10-CM | POA: Diagnosis not present

## 2016-11-23 DIAGNOSIS — M25552 Pain in left hip: Secondary | ICD-10-CM | POA: Diagnosis not present

## 2016-11-24 DIAGNOSIS — F329 Major depressive disorder, single episode, unspecified: Secondary | ICD-10-CM | POA: Diagnosis not present

## 2016-11-25 DIAGNOSIS — M25552 Pain in left hip: Secondary | ICD-10-CM | POA: Diagnosis not present

## 2016-12-01 DIAGNOSIS — M25552 Pain in left hip: Secondary | ICD-10-CM | POA: Diagnosis not present

## 2016-12-01 DIAGNOSIS — R2689 Other abnormalities of gait and mobility: Secondary | ICD-10-CM | POA: Diagnosis not present

## 2016-12-03 DIAGNOSIS — R2689 Other abnormalities of gait and mobility: Secondary | ICD-10-CM | POA: Diagnosis not present

## 2016-12-03 DIAGNOSIS — M25552 Pain in left hip: Secondary | ICD-10-CM | POA: Diagnosis not present

## 2016-12-10 ENCOUNTER — Ambulatory Visit (INDEPENDENT_AMBULATORY_CARE_PROVIDER_SITE_OTHER): Payer: Medicare Other | Admitting: Orthopedic Surgery

## 2016-12-11 DIAGNOSIS — R2689 Other abnormalities of gait and mobility: Secondary | ICD-10-CM | POA: Diagnosis not present

## 2016-12-11 DIAGNOSIS — M25552 Pain in left hip: Secondary | ICD-10-CM | POA: Diagnosis not present

## 2016-12-14 ENCOUNTER — Ambulatory Visit (INDEPENDENT_AMBULATORY_CARE_PROVIDER_SITE_OTHER): Payer: Medicare Other | Admitting: Orthopedic Surgery

## 2016-12-23 ENCOUNTER — Ambulatory Visit (INDEPENDENT_AMBULATORY_CARE_PROVIDER_SITE_OTHER): Payer: No Typology Code available for payment source

## 2016-12-23 ENCOUNTER — Encounter (INDEPENDENT_AMBULATORY_CARE_PROVIDER_SITE_OTHER): Payer: Self-pay | Admitting: Orthopedic Surgery

## 2016-12-23 ENCOUNTER — Ambulatory Visit (INDEPENDENT_AMBULATORY_CARE_PROVIDER_SITE_OTHER): Payer: No Typology Code available for payment source | Admitting: Orthopedic Surgery

## 2016-12-23 DIAGNOSIS — S72002A Fracture of unspecified part of neck of left femur, initial encounter for closed fracture: Secondary | ICD-10-CM

## 2016-12-26 ENCOUNTER — Encounter (INDEPENDENT_AMBULATORY_CARE_PROVIDER_SITE_OTHER): Payer: Self-pay | Admitting: Orthopedic Surgery

## 2016-12-26 NOTE — Progress Notes (Signed)
Post-Op Visit Note   Patient: Yolanda Shaw           Date of Birth: 11/19/1931           MRN: [id number] Visit Date: 12/23/2016 PCP: Yolanda Shaw, Karrar, MD   Assessment & Plan:  Chief Complaint:  Chief Complaint  Patient presents with  . Left Leg - Follow-up   Visit Diagnoses:  1. Closed left hip fracture, initial encounter [facility](HCC)     Plan: Yolanda GlassingLoretta is a patient who is now 2 months out left hip intramedullary nail.  On examination she generally is a little bit stiff all over.  She has general rigidity of her muscles.  Left hip does not have much pain with passive range of motion.  Radiographs show some varus alignment of the fracture but there does appear to be interval callus formation around the fracture and no evidence of hardware failure or screw cut out or loosening.  Plan at this time is weightbearing as tolerated with return clinic visit in 4 weeks.  Repeat radiographs needed at that time  Follow-Up Instructions: Return in about 4 weeks (around 01/20/2017).   Orders:  Orders Placed This Encounter  Procedures  . XR FEMUR MIN 2 VIEWS LEFT   No orders of the defined types were placed in this encounter.   Imaging: No results found.  PMFS History: Patient Active Problem List   Diagnosis Date Noted  . Acute blood loss anemia   . Closed left hip fracture, initial encounter (HCC) 10/19/2016  . Hip fracture (HCC) 10/19/2016  . Anemia 03/02/2016  . Closed stable fracture of multiple pubic rami (HCC) 02/17/2016  . Closed fracture of right proximal humerus   . Essential hypertension   . AKI (acute kidney injury) (HCC)   . Dementia with behavioral disturbance   . Pelvic fracture (HCC) 06/30/2014  . Thrombocytopenia (HCC) 06/30/2014  . Tremor 06/30/2014  . Pressure ulcer 06/30/2014  . Fracture of left pubis (HCC)   . Ischium fracture (HCC)   . Left hip pain   . Bilateral pubic rami fractures (HCC) 06/29/2014  . Atrial fibrillation (HCC) 03/02/2014   Past Medical History:    Diagnosis Date  . AKI (acute kidney injury) (HCC)   . Anemia    low iron  . Anxiety    panic attacks  . Arthritis   . Bilateral pubic rami fractures (HCC) 06/29/2014  . Closed fracture of right proximal humerus   . Closed stable fracture of multiple pubic rami (HCC) 02/17/2016  . Dementia   . Dementia with behavioral disturbance   . Essential hypertension   . Fracture of left pubis (HCC)   . Ischium fracture (HCC)   . Pelvic fracture (HCC) 06/30/2014  . Pressure ulcer 06/30/2014  . Thrombocytopenia (HCC) 06/30/2014  . Tremor 06/30/2014    Family History  Problem Relation Age of Onset  . Hypertension Other     Past Surgical History:  Procedure Laterality Date  . ADENOIDECTOMY    . INTRAMEDULLARY (IM) NAIL INTERTROCHANTERIC Left 10/19/2016   Procedure: INTRAMEDULLARY (IM) NAIL INTERTROCHANTRIC;  Surgeon: Yolanda Copaean, Yolanda Scott, MD;  [facility];  Service: Orthopedics;  Laterality: Left;  . SHOULDER SURGERY     right  . TONSILLECTOMY    . WRIST SURGERY     Social History   Occupational History  . Occupation: retired Psychologist, forensiclegal secretary   Tobacco Use  . Smoking status: Former Smoker    Types: Cigarettes    Last attempt to quit: 01/01/1947  Years since quitting: 70.0  . Smokeless tobacco: Never Used  Substance and Sexual Activity  . Alcohol use: Yes    Comment: occassional wine  . Drug use: No  . Sexual activity: No

## 2016-12-27 ENCOUNTER — Encounter (HOSPITAL_COMMUNITY): Payer: Self-pay

## 2016-12-27 ENCOUNTER — Other Ambulatory Visit: Payer: Self-pay

## 2016-12-27 ENCOUNTER — Emergency Department (HOSPITAL_COMMUNITY): Payer: Medicare Other

## 2016-12-27 ENCOUNTER — Emergency Department (HOSPITAL_COMMUNITY)
Admission: EM | Admit: 2016-12-27 | Discharge: 2016-12-27 | Disposition: A | Discharge status: Home / Self Care | Payer: Medicare Other | Attending: Emergency Medicine | Admitting: Emergency Medicine

## 2016-12-27 DIAGNOSIS — Z7982 Long term (current) use of aspirin: Secondary | ICD-10-CM | POA: Insufficient documentation

## 2016-12-27 DIAGNOSIS — Z79899 Other long term (current) drug therapy: Secondary | ICD-10-CM | POA: Diagnosis not present

## 2016-12-27 DIAGNOSIS — M79662 Pain in left lower leg: Secondary | ICD-10-CM | POA: Diagnosis not present

## 2016-12-27 DIAGNOSIS — F419 Anxiety disorder, unspecified: Secondary | ICD-10-CM | POA: Diagnosis not present

## 2016-12-27 DIAGNOSIS — F0390 Unspecified dementia without behavioral disturbance: Secondary | ICD-10-CM | POA: Diagnosis not present

## 2016-12-27 DIAGNOSIS — Z01818 Encounter for other preprocedural examination: Secondary | ICD-10-CM | POA: Diagnosis not present

## 2016-12-27 DIAGNOSIS — M79605 Pain in left leg: Secondary | ICD-10-CM | POA: Diagnosis not present

## 2016-12-27 DIAGNOSIS — I10 Essential (primary) hypertension: Secondary | ICD-10-CM | POA: Insufficient documentation

## 2016-12-27 DIAGNOSIS — Z87891 Personal history of nicotine dependence: Secondary | ICD-10-CM | POA: Insufficient documentation

## 2016-12-27 DIAGNOSIS — S79911A Unspecified injury of right hip, initial encounter: Secondary | ICD-10-CM | POA: Diagnosis not present

## 2016-12-27 DIAGNOSIS — Z01811 Encounter for preprocedural respiratory examination: Secondary | ICD-10-CM

## 2016-12-27 LAB — BASIC METABOLIC PANEL
Anion gap: 9 (ref 5–15)
BUN: 11 mg/dL (ref 6–20)
CO2: 24 mmol/L (ref 22–32)
CREATININE: 0.64 mg/dL (ref 0.44–1.00)
Calcium: 8.8 mg/dL — ABNORMAL LOW (ref 8.9–10.3)
Chloride: 105 mmol/L (ref 101–111)
GFR calc Af Amer: >60 mL/min (ref >60)
GLUCOSE: 87 mg/dL (ref 65–99)
POTASSIUM: 3.3 mmol/L — AB (ref 3.5–5.1)
SODIUM: 138 mmol/L (ref 135–145)

## 2016-12-27 LAB — CBC WITH DIFFERENTIAL/PLATELET
Basophils Absolute: 0 10*3/uL (ref 0.0–0.1)
Basophils Relative: 0 %
EOS ABS: 0.2 10*3/uL (ref 0.0–0.7)
EOS PCT: 3 %
HCT: 37.7 % (ref 36.0–46.0)
Hemoglobin: 12.6 g/dL (ref 12.0–15.0)
LYMPHS ABS: 1.4 10*3/uL (ref 0.7–4.0)
LYMPHS PCT: 27 %
MCH: 31.5 pg (ref 26.0–34.0)
MCHC: 33.4 g/dL (ref 30.0–36.0)
MCV: 94.3 fL (ref 78.0–100.0)
MONO ABS: 0.5 10*3/uL (ref 0.1–1.0)
MONOS PCT: 8 %
Neutro Abs: 3.3 10*3/uL (ref 1.7–7.7)
Neutrophils Relative %: 62 %
PLATELETS: 150 10*3/uL (ref 150–400)
RBC: 4 MIL/uL (ref 3.87–5.11)
RDW: 15.3 % (ref 11.5–15.5)
WBC: 5.4 10*3/uL (ref 4.0–10.5)

## 2016-12-27 NOTE — ED Notes (Signed)
Pt. Return XRAY via stretcher.

## 2016-12-27 NOTE — ED Notes (Signed)
Pt. To xray via stretcher

## 2016-12-27 NOTE — ED Notes (Signed)
Attempted blood draw x2 unsuccessful 

## 2016-12-27 NOTE — ED Notes (Signed)
Attempted report to [facility]  

## 2016-12-27 NOTE — ED Provider Notes (Signed)
[facility] Provider Note   CSN: [id number] Arrival date & time: 12/27/16  1956     History   Chief Complaint No chief complaint on file.   HPI Yolanda Shaw is a 83 y.o. female.  83 yo F sent from the nursing home for a possible right hip fracture.  The patient denies any such complaint.  Level 5 caveat dementia.  Per EMS they had obtained an x-ray after she was found on the floor and there is a questionable right femur fracture.  They then sent the patient here for evaluation.  Patient denies any pain to her right hip.  I am able to range it without difficulty.  Pulse motor and sensation is intact distally.  She is complaining of some very mild posterior left leg pain.  There is no other signs of trauma.   The history is provided by the patient.  Illness  This is a new problem. The current episode started 2 days ago. The problem occurs constantly. The problem has not changed since onset.Pertinent negatives include no chest pain, no headaches and no shortness of breath. Nothing aggravates the symptoms. Nothing relieves the symptoms. She has tried nothing for the symptoms. The treatment provided no relief.    Past Medical History:  Diagnosis Date  . AKI (acute kidney injury) (HCC)   . Anemia    low iron  . Anxiety    panic attacks  . Arthritis   . Bilateral pubic rami fractures (HCC) 06/29/2014  . Closed fracture of right proximal humerus   . Closed stable fracture of multiple pubic rami (HCC) 02/17/2016  . Dementia   . Dementia with behavioral disturbance   . Essential hypertension   . Fracture of left pubis (HCC)   . Ischium fracture (HCC)   . Pelvic fracture (HCC) 06/30/2014  . Pressure ulcer 06/30/2014  . Thrombocytopenia (HCC) 06/30/2014  . Tremor 06/30/2014    Patient Active Problem List   Diagnosis Date Noted  . Acute blood loss anemia   . Closed left hip fracture, initial encounter (HCC) 10/19/2016  . Hip fracture (HCC)  10/19/2016  . Anemia 03/02/2016  . Closed stable fracture of multiple pubic rami (HCC) 02/17/2016  . Closed fracture of right proximal humerus   . Essential hypertension   . AKI (acute kidney injury) (HCC)   . Dementia with behavioral disturbance   . Pelvic fracture (HCC) 06/30/2014  . Thrombocytopenia (HCC) 06/30/2014  . Tremor 06/30/2014  . Pressure ulcer 06/30/2014  . Fracture of left pubis (HCC)   . Ischium fracture (HCC)   . Left hip pain   . Bilateral pubic rami fractures (HCC) 06/29/2014  . Atrial fibrillation (HCC) 03/02/2014    Past Surgical History:  Procedure Laterality Date  . ADENOIDECTOMY    . INTRAMEDULLARY (IM) NAIL INTERTROCHANTERIC Left 10/19/2016   Procedure: INTRAMEDULLARY (IM) NAIL INTERTROCHANTRIC;  Surgeon: Cammy Copaean, Gregory Scott, MD;  [facility];  Service: Orthopedics;  Laterality: Left;  . SHOULDER SURGERY     right  . TONSILLECTOMY    . WRIST SURGERY      OB History    No data available       Home Medications    Prior to Admission medications   Medication Sig Start Date End Date Taking? Authorizing Provider  acetaminophen (TYLENOL) 325 MG tablet Take 2 tablets (650 mg total) by mouth every 6 (six) hours as needed for mild pain or fever. 10/22/16   Elease EtienneHongalgi, Anand D, MD  ALPRAZolam (XANAX) 0.25 MG tablet Take 1 tablet (0.25 mg total) by mouth 2 (two) times daily as needed for anxiety. 10/22/16   Elease Etienne, MD  aspirin EC 325 MG EC tablet Take 1 tablet (325 mg total) by mouth daily with breakfast. 10/23/16   Hongalgi, Maximino Greenland, MD  feeding supplement, ENSURE ENLIVE, (ENSURE ENLIVE) LIQD Take 237 mLs by mouth daily at 3 pm. 10/22/16   Hongalgi, Maximino Greenland, MD  HYDROcodone-acetaminophen (NORCO/VICODIN) 5-325 MG tablet Take 1 tablet by mouth every 6 (six) hours as needed for moderate pain or severe pain. 10/22/16   Hongalgi, Maximino Greenland, MD  memantine (NAMENDA) 10 MG tablet Take 10 mg by mouth 2 (two) times daily.    [provider]  senna  (SENOKOT) 8.6 MG TABS tablet Take 1 tablet (8.6 mg total) by mouth daily. Patient taking differently: Take 1 tablet by mouth daily as needed for mild constipation.  02/19/16   Cleora Fleet, MD    Family History Family History  Problem Relation Age of Onset  . Hypertension Other     Social History Social History   Tobacco Use  . Smoking status: Former Smoker    Types: Cigarettes    Last attempt to quit: 01/01/1947    Years since quitting: 70.0  . Smokeless tobacco: Never Used  Substance Use Topics  . Alcohol use: Yes    Comment: occassional wine  . Drug use: No     Allergies   Ether; Amitriptyline; and Penicillins   Review of Systems Review of Systems  Constitutional: Negative for chills and fever.  HENT: Negative for congestion and rhinorrhea.   Eyes: Negative for redness and visual disturbance.  Respiratory: Negative for shortness of breath and wheezing.   Cardiovascular: Negative for chest pain and palpitations.  Gastrointestinal: Negative for nausea and vomiting.  Genitourinary: Negative for dysuria and urgency.  Musculoskeletal: Positive for arthralgias and myalgias.  Skin: Negative for pallor and wound.  Neurological: Negative for dizziness and headaches.     Physical Exam Updated Vital Signs BP 132/86   Pulse 78   Temp (!) 97.4 F (36.3 C) (Oral)   Resp (!) 9   SpO2 98%   Physical Exam  Constitutional: She is oriented to person, place, and time. No distress.  HENT:  Head: Normocephalic and atraumatic.  Eyes: EOM are normal. Pupils are equal, round, and reactive to light.  Neck: Normal range of motion. Neck supple.  Cardiovascular: Normal rate and regular rhythm. Exam reveals no gallop and no friction rub.  No murmur heard. Pulmonary/Chest: Effort normal. She has no wheezes. She has no rales.  Abdominal: Soft. She exhibits no distension and no mass. There is no tenderness. There is no guarding.  Musculoskeletal: She exhibits no edema or  tenderness.  Neurological: She is alert and oriented to person, place, and time.  Skin: Skin is warm and dry. She is not diaphoretic.  Psychiatric: She has a normal mood and affect. Her behavior is normal.  Nursing note and vitals reviewed.    ED Treatments / Results  Labs (all labs ordered are listed, but only abnormal results are displayed) Labs Reviewed  BASIC METABOLIC PANEL - Abnormal; Notable for the following components:      Result Value   Potassium 3.3 (*)    Calcium 8.8 (*)    All other components within normal limits  CBC WITH DIFFERENTIAL/PLATELET    EKG  EKG Interpretation None       Radiology Dg Chest  1 View  Result Date: 12/27/2016 CLINICAL DATA:  Preop exam. EXAM: CHEST 1 VIEW COMPARISON:  10/18/2016 FINDINGS: Patient is rotated to the right. Lungs are adequately inflated without focal airspace consolidation or effusion. Cardiomediastinal silhouette and remainder of the exam is unchanged. IMPRESSION: No active disease. Electronically Signed   By: Elberta Fortisaniel  Boyle M.D.   On: 12/27/2016 21:06   Dg Hip Unilat W Or Wo Pelvis 2-3 Views Right  Result Date: 12/27/2016 CLINICAL DATA:  Possible right femoral neck fracture after fall. EXAM: DG HIP (WITH OR WITHOUT PELVIS) 2-3V RIGHT COMPARISON:  02/17/2016 and 10/19/2016 FINDINGS: Degenerative changes of the hips right worse than left which is stable. Left hip fracture unchanged post fixation with hardware intact. Old bilateral pelvic fractures. There is diffuse osteopenia. No definite acute fracture visualized. Remainder of the exam is unchanged. IMPRESSION: No acute fracture or dislocation. Left hip fracture post fixation with hardware intact. Old bilateral pelvic fractures unchanged. Electronically Signed   By: Elberta Fortisaniel  Boyle M.D.   On: 12/27/2016 21:05    Procedures Procedures (including critical care time)  Medications Ordered in ED Medications - No data to display   Initial Impression / Assessment and Plan / ED  Course  I have reviewed the triage vital signs and the nursing notes.  Pertinent labs & imaging results that were available during my care of the patient were reviewed by me and considered in my medical decision making (see chart for details).     83 yo F with a chief complaint of a possible right hip fracture.  X-ray here is negative.  She has no pain on range of motion.  I do not feel that its an occult fracture.  We will have her follow-up with her PCP.  9:56 PM:  I have discussed the diagnosis/risks/treatment options with the patient and family and believe the pt to be eligible for discharge home to follow-up with PCP. We also discussed returning to the ED immediately if new or worsening sx occur. We discussed the sx which are most concerning (e.g., sudden worsening pain, fever, inability to tolerate by mouth) that necessitate immediate return. Medications administered to the patient during their visit and any new prescriptions provided to the patient are listed below.  Medications given during this visit Medications - No data to display   The patient appears reasonably screen and/or stabilized for discharge and I doubt any other medical condition or other [facility] requiring further screening, evaluation, or treatment in the ED at this time prior to discharge.    Final Clinical Impressions(s) / ED Diagnoses   Final diagnoses:  Pain of left lower extremity    ED Discharge Orders    None       Melene PlanFloyd, Zamara Cozad, DO 12/27/16 [id number]

## 2016-12-27 NOTE — ED Triage Notes (Signed)
[facility] with possible right hairline fracture of the femoral neck. Pt. Had left hip replaced in past. Pt. Has stage 3 pressure ulcer on sacrum. Pt. Oriented to self. Pt. Has hx of dementia.

## 2016-12-27 NOTE — ED Notes (Signed)
Contacted [facility] to tx patient back to [facility] 

## 2016-12-30 DIAGNOSIS — Z9181 History of falling: Secondary | ICD-10-CM | POA: Diagnosis not present

## 2016-12-30 DIAGNOSIS — M25552 Pain in left hip: Secondary | ICD-10-CM | POA: Diagnosis not present

## 2016-12-30 DIAGNOSIS — R2689 Other abnormalities of gait and mobility: Secondary | ICD-10-CM | POA: Diagnosis not present

## 2016-12-30 DIAGNOSIS — F0391 Unspecified dementia with behavioral disturbance: Secondary | ICD-10-CM | POA: Diagnosis not present

## 2017-01-01 DIAGNOSIS — F0280 Dementia in other diseases classified elsewhere without behavioral disturbance: Secondary | ICD-10-CM | POA: Diagnosis not present

## 2017-01-01 DIAGNOSIS — R32 Unspecified urinary incontinence: Secondary | ICD-10-CM | POA: Diagnosis not present

## 2017-01-01 DIAGNOSIS — F419 Anxiety disorder, unspecified: Secondary | ICD-10-CM | POA: Diagnosis not present

## 2017-01-01 DIAGNOSIS — Z7409 Other reduced mobility: Secondary | ICD-10-CM | POA: Diagnosis not present

## 2017-01-04 DIAGNOSIS — K5900 Constipation, unspecified: Secondary | ICD-10-CM | POA: Diagnosis not present

## 2017-01-06 DIAGNOSIS — F329 Major depressive disorder, single episode, unspecified: Secondary | ICD-10-CM | POA: Diagnosis not present

## 2017-01-06 DIAGNOSIS — F419 Anxiety disorder, unspecified: Secondary | ICD-10-CM | POA: Diagnosis not present

## 2017-01-07 DIAGNOSIS — R2689 Other abnormalities of gait and mobility: Secondary | ICD-10-CM | POA: Diagnosis not present

## 2017-01-07 DIAGNOSIS — M25552 Pain in left hip: Secondary | ICD-10-CM | POA: Diagnosis not present

## 2017-01-20 DIAGNOSIS — D649 Anemia, unspecified: Secondary | ICD-10-CM | POA: Diagnosis not present

## 2017-01-20 DIAGNOSIS — F0391 Unspecified dementia with behavioral disturbance: Secondary | ICD-10-CM | POA: Diagnosis not present

## 2017-01-21 ENCOUNTER — Ambulatory Visit (INDEPENDENT_AMBULATORY_CARE_PROVIDER_SITE_OTHER): Payer: No Typology Code available for payment source | Admitting: Orthopedic Surgery

## 2017-01-21 ENCOUNTER — Encounter (INDEPENDENT_AMBULATORY_CARE_PROVIDER_SITE_OTHER): Payer: Self-pay | Admitting: Orthopedic Surgery

## 2017-01-21 ENCOUNTER — Ambulatory Visit (INDEPENDENT_AMBULATORY_CARE_PROVIDER_SITE_OTHER): Payer: No Typology Code available for payment source

## 2017-01-21 DIAGNOSIS — S72002A Fracture of unspecified part of neck of left femur, initial encounter for closed fracture: Secondary | ICD-10-CM

## 2017-01-21 NOTE — Progress Notes (Signed)
Post-Op Visit Note   Patient: Yolanda Shaw           Date of Birth: 01/01/1932           MRN: [id number] Visit Date: 01/21/2017 PCP: Georgann HousekeeperHusain, Karrar, MD   Assessment & Plan:  Chief Complaint:  Chief Complaint  Patient presents with  . Left Leg - Follow-up   Visit Diagnoses:  1. Closed left hip fracture, initial encounter [facility](HCC)     Plan: Yolanda Shaw is an 84 year old female with left hip intertrochanteric fracture fixed several months ago.  She has been doing well.  She has been doing some weightbearing as tolerated.  On exam no real pain with internal/external rotation of either leg.  Radiographs look like the fracture is healed.  Continue weightbearing as tolerated left lower extremity with daily therapy for gait training follow-up with me as needed  Follow-Up Instructions: Return if symptoms worsen or fail to improve.   Orders:  Orders Placed This Encounter  Procedures  . XR HIP UNILAT W OR W/O PELVIS 2-3 VIEWS LEFT   No orders of the defined types were placed in this encounter.   Imaging: Xr Hip Unilat W Or W/o Pelvis 2-3 Views Left  Result Date: 01/21/2017 AP pelvis lateral left hip reviewed.  Healed high intertrochanteric fracture is present.  No evidence of hardware complication.  The fracture has healed in some varus.   PMFS History: Patient Active Problem List   Diagnosis Date Noted  . Acute blood loss anemia   . Closed left hip fracture, initial encounter (HCC) 10/19/2016  . Hip fracture (HCC) 10/19/2016  . Anemia 03/02/2016  . Closed stable fracture of multiple pubic rami (HCC) 02/17/2016  . Closed fracture of right proximal humerus   . Essential hypertension   . AKI (acute kidney injury) (HCC)   . Dementia with behavioral disturbance   . Pelvic fracture (HCC) 06/30/2014  . Thrombocytopenia (HCC) 06/30/2014  . Tremor 06/30/2014  . Pressure ulcer 06/30/2014  . Fracture of left pubis (HCC)   . Ischium fracture (HCC)   . Left hip pain   . Bilateral pubic  rami fractures (HCC) 06/29/2014  . Atrial fibrillation (HCC) 03/02/2014   Past Medical History:  Diagnosis Date  . AKI (acute kidney injury) (HCC)   . Anemia    low iron  . Anxiety    panic attacks  . Arthritis   . Bilateral pubic rami fractures (HCC) 06/29/2014  . Closed fracture of right proximal humerus   . Closed stable fracture of multiple pubic rami (HCC) 02/17/2016  . Dementia   . Dementia with behavioral disturbance   . Essential hypertension   . Fracture of left pubis (HCC)   . Ischium fracture (HCC)   . Pelvic fracture (HCC) 06/30/2014  . Pressure ulcer 06/30/2014  . Thrombocytopenia (HCC) 06/30/2014  . Tremor 06/30/2014    Family History  Problem Relation Age of Onset  . Hypertension Other     Past Surgical History:  Procedure Laterality Date  . ADENOIDECTOMY    . INTRAMEDULLARY (IM) NAIL INTERTROCHANTERIC Left 10/19/2016   Procedure: INTRAMEDULLARY (IM) NAIL INTERTROCHANTRIC;  Surgeon: Cammy Copaean, Gregory Scott, MD;  [facility];  Service: Orthopedics;  Laterality: Left;  . SHOULDER SURGERY     right  . TONSILLECTOMY    . WRIST SURGERY     Social History   Occupational History  . Occupation: retired Psychologist, forensiclegal secretary   Tobacco Use  . Smoking status: Former Smoker    Types: Cigarettes  Last attempt to quit: 01/01/1947    Years since quitting: 70.1  . Smokeless tobacco: Never Used  Substance and Sexual Activity  . Alcohol use: Yes    Comment: occassional wine  . Drug use: No  . Sexual activity: No

## 2017-01-25 DIAGNOSIS — F0391 Unspecified dementia with behavioral disturbance: Secondary | ICD-10-CM | POA: Diagnosis not present

## 2017-01-25 DIAGNOSIS — Z9181 History of falling: Secondary | ICD-10-CM | POA: Diagnosis not present

## 2017-02-03 DIAGNOSIS — R2689 Other abnormalities of gait and mobility: Secondary | ICD-10-CM | POA: Diagnosis not present

## 2017-02-03 DIAGNOSIS — R278 Other lack of coordination: Secondary | ICD-10-CM | POA: Diagnosis not present

## 2017-02-03 DIAGNOSIS — M6281 Muscle weakness (generalized): Secondary | ICD-10-CM | POA: Diagnosis not present

## 2017-02-03 DIAGNOSIS — M25552 Pain in left hip: Secondary | ICD-10-CM | POA: Diagnosis not present

## 2017-02-04 DIAGNOSIS — R278 Other lack of coordination: Secondary | ICD-10-CM | POA: Diagnosis not present

## 2017-02-04 DIAGNOSIS — M6281 Muscle weakness (generalized): Secondary | ICD-10-CM | POA: Diagnosis not present

## 2017-02-05 DIAGNOSIS — R278 Other lack of coordination: Secondary | ICD-10-CM | POA: Diagnosis not present

## 2017-02-05 DIAGNOSIS — M6281 Muscle weakness (generalized): Secondary | ICD-10-CM | POA: Diagnosis not present

## 2017-02-08 DIAGNOSIS — R278 Other lack of coordination: Secondary | ICD-10-CM | POA: Diagnosis not present

## 2017-02-08 DIAGNOSIS — M6281 Muscle weakness (generalized): Secondary | ICD-10-CM | POA: Diagnosis not present

## 2017-02-09 DIAGNOSIS — M6281 Muscle weakness (generalized): Secondary | ICD-10-CM | POA: Diagnosis not present

## 2017-02-09 DIAGNOSIS — R278 Other lack of coordination: Secondary | ICD-10-CM | POA: Diagnosis not present

## 2017-02-10 DIAGNOSIS — M6281 Muscle weakness (generalized): Secondary | ICD-10-CM | POA: Diagnosis not present

## 2017-02-10 DIAGNOSIS — R278 Other lack of coordination: Secondary | ICD-10-CM | POA: Diagnosis not present

## 2017-02-11 DIAGNOSIS — M6281 Muscle weakness (generalized): Secondary | ICD-10-CM | POA: Diagnosis not present

## 2017-02-11 DIAGNOSIS — R278 Other lack of coordination: Secondary | ICD-10-CM | POA: Diagnosis not present

## 2017-02-12 DIAGNOSIS — M6281 Muscle weakness (generalized): Secondary | ICD-10-CM | POA: Diagnosis not present

## 2017-02-12 DIAGNOSIS — R278 Other lack of coordination: Secondary | ICD-10-CM | POA: Diagnosis not present

## 2017-02-15 DIAGNOSIS — M6281 Muscle weakness (generalized): Secondary | ICD-10-CM | POA: Diagnosis not present

## 2017-02-15 DIAGNOSIS — R278 Other lack of coordination: Secondary | ICD-10-CM | POA: Diagnosis not present

## 2017-02-16 DIAGNOSIS — M6281 Muscle weakness (generalized): Secondary | ICD-10-CM | POA: Diagnosis not present

## 2017-02-16 DIAGNOSIS — R278 Other lack of coordination: Secondary | ICD-10-CM | POA: Diagnosis not present

## 2017-02-17 DIAGNOSIS — M6281 Muscle weakness (generalized): Secondary | ICD-10-CM | POA: Diagnosis not present

## 2017-02-17 DIAGNOSIS — R278 Other lack of coordination: Secondary | ICD-10-CM | POA: Diagnosis not present

## 2017-02-18 DIAGNOSIS — R630 Anorexia: Secondary | ICD-10-CM | POA: Diagnosis not present

## 2017-02-18 DIAGNOSIS — R278 Other lack of coordination: Secondary | ICD-10-CM | POA: Diagnosis not present

## 2017-02-18 DIAGNOSIS — M6281 Muscle weakness (generalized): Secondary | ICD-10-CM | POA: Diagnosis not present

## 2017-02-18 DIAGNOSIS — E46 Unspecified protein-calorie malnutrition: Secondary | ICD-10-CM | POA: Diagnosis not present

## 2017-02-19 DIAGNOSIS — M6281 Muscle weakness (generalized): Secondary | ICD-10-CM | POA: Diagnosis not present

## 2017-02-19 DIAGNOSIS — R278 Other lack of coordination: Secondary | ICD-10-CM | POA: Diagnosis not present

## 2017-02-22 DIAGNOSIS — M6281 Muscle weakness (generalized): Secondary | ICD-10-CM | POA: Diagnosis not present

## 2017-02-22 DIAGNOSIS — R278 Other lack of coordination: Secondary | ICD-10-CM | POA: Diagnosis not present

## 2017-02-23 DIAGNOSIS — R278 Other lack of coordination: Secondary | ICD-10-CM | POA: Diagnosis not present

## 2017-02-23 DIAGNOSIS — M6281 Muscle weakness (generalized): Secondary | ICD-10-CM | POA: Diagnosis not present

## 2017-02-24 DIAGNOSIS — R278 Other lack of coordination: Secondary | ICD-10-CM | POA: Diagnosis not present

## 2017-02-24 DIAGNOSIS — M6281 Muscle weakness (generalized): Secondary | ICD-10-CM | POA: Diagnosis not present

## 2017-02-25 DIAGNOSIS — R278 Other lack of coordination: Secondary | ICD-10-CM | POA: Diagnosis not present

## 2017-02-25 DIAGNOSIS — M6281 Muscle weakness (generalized): Secondary | ICD-10-CM | POA: Diagnosis not present

## 2017-02-26 DIAGNOSIS — M6281 Muscle weakness (generalized): Secondary | ICD-10-CM | POA: Diagnosis not present

## 2017-02-26 DIAGNOSIS — R278 Other lack of coordination: Secondary | ICD-10-CM | POA: Diagnosis not present

## 2017-03-01 DIAGNOSIS — S72002A Fracture of unspecified part of neck of left femur, initial encounter for closed fracture: Secondary | ICD-10-CM | POA: Diagnosis not present

## 2017-03-01 DIAGNOSIS — F0391 Unspecified dementia with behavioral disturbance: Secondary | ICD-10-CM | POA: Diagnosis not present

## 2017-03-01 DIAGNOSIS — M6281 Muscle weakness (generalized): Secondary | ICD-10-CM | POA: Diagnosis not present

## 2017-03-01 DIAGNOSIS — R278 Other lack of coordination: Secondary | ICD-10-CM | POA: Diagnosis not present

## 2017-03-03 DIAGNOSIS — M6281 Muscle weakness (generalized): Secondary | ICD-10-CM | POA: Diagnosis not present

## 2017-03-03 DIAGNOSIS — S72002D Fracture of unspecified part of neck of left femur, subsequent encounter for closed fracture with routine healing: Secondary | ICD-10-CM | POA: Diagnosis not present

## 2017-03-03 DIAGNOSIS — F0391 Unspecified dementia with behavioral disturbance: Secondary | ICD-10-CM | POA: Diagnosis not present

## 2017-03-03 DIAGNOSIS — R1312 Dysphagia, oropharyngeal phase: Secondary | ICD-10-CM | POA: Diagnosis not present

## 2017-03-03 DIAGNOSIS — Z9181 History of falling: Secondary | ICD-10-CM | POA: Diagnosis not present

## 2017-03-03 DIAGNOSIS — R41841 Cognitive communication deficit: Secondary | ICD-10-CM | POA: Diagnosis not present

## 2017-03-03 DIAGNOSIS — R278 Other lack of coordination: Secondary | ICD-10-CM | POA: Diagnosis not present

## 2017-03-08 DIAGNOSIS — R41841 Cognitive communication deficit: Secondary | ICD-10-CM | POA: Diagnosis not present

## 2017-03-08 DIAGNOSIS — R1312 Dysphagia, oropharyngeal phase: Secondary | ICD-10-CM | POA: Diagnosis not present

## 2017-03-08 DIAGNOSIS — R278 Other lack of coordination: Secondary | ICD-10-CM | POA: Diagnosis not present

## 2017-03-08 DIAGNOSIS — F0391 Unspecified dementia with behavioral disturbance: Secondary | ICD-10-CM | POA: Diagnosis not present

## 2017-03-08 DIAGNOSIS — M6281 Muscle weakness (generalized): Secondary | ICD-10-CM | POA: Diagnosis not present

## 2017-03-08 DIAGNOSIS — S72002D Fracture of unspecified part of neck of left femur, subsequent encounter for closed fracture with routine healing: Secondary | ICD-10-CM | POA: Diagnosis not present

## 2017-03-10 DIAGNOSIS — R05 Cough: Secondary | ICD-10-CM | POA: Diagnosis not present

## 2017-03-10 DIAGNOSIS — R41841 Cognitive communication deficit: Secondary | ICD-10-CM | POA: Diagnosis not present

## 2017-03-10 DIAGNOSIS — M6281 Muscle weakness (generalized): Secondary | ICD-10-CM | POA: Diagnosis not present

## 2017-03-10 DIAGNOSIS — E46 Unspecified protein-calorie malnutrition: Secondary | ICD-10-CM | POA: Diagnosis not present

## 2017-03-10 DIAGNOSIS — F411 Generalized anxiety disorder: Secondary | ICD-10-CM | POA: Diagnosis not present

## 2017-03-10 DIAGNOSIS — Z8781 Personal history of (healed) traumatic fracture: Secondary | ICD-10-CM | POA: Diagnosis not present

## 2017-03-10 DIAGNOSIS — F0391 Unspecified dementia with behavioral disturbance: Secondary | ICD-10-CM | POA: Diagnosis not present

## 2017-03-10 DIAGNOSIS — R627 Adult failure to thrive: Secondary | ICD-10-CM | POA: Diagnosis not present

## 2017-03-10 DIAGNOSIS — R278 Other lack of coordination: Secondary | ICD-10-CM | POA: Diagnosis not present

## 2017-03-10 DIAGNOSIS — F0390 Unspecified dementia without behavioral disturbance: Secondary | ICD-10-CM | POA: Diagnosis not present

## 2017-03-10 DIAGNOSIS — R1312 Dysphagia, oropharyngeal phase: Secondary | ICD-10-CM | POA: Diagnosis not present

## 2017-03-10 DIAGNOSIS — S72002D Fracture of unspecified part of neck of left femur, subsequent encounter for closed fracture with routine healing: Secondary | ICD-10-CM | POA: Diagnosis not present

## 2017-03-11 DIAGNOSIS — R41841 Cognitive communication deficit: Secondary | ICD-10-CM | POA: Diagnosis not present

## 2017-03-11 DIAGNOSIS — M6281 Muscle weakness (generalized): Secondary | ICD-10-CM | POA: Diagnosis not present

## 2017-03-11 DIAGNOSIS — S72002D Fracture of unspecified part of neck of left femur, subsequent encounter for closed fracture with routine healing: Secondary | ICD-10-CM | POA: Diagnosis not present

## 2017-03-11 DIAGNOSIS — F0391 Unspecified dementia with behavioral disturbance: Secondary | ICD-10-CM | POA: Diagnosis not present

## 2017-03-11 DIAGNOSIS — R278 Other lack of coordination: Secondary | ICD-10-CM | POA: Diagnosis not present

## 2017-03-11 DIAGNOSIS — R1312 Dysphagia, oropharyngeal phase: Secondary | ICD-10-CM | POA: Diagnosis not present

## 2017-03-12 DIAGNOSIS — R1312 Dysphagia, oropharyngeal phase: Secondary | ICD-10-CM | POA: Diagnosis not present

## 2017-03-12 DIAGNOSIS — S72002D Fracture of unspecified part of neck of left femur, subsequent encounter for closed fracture with routine healing: Secondary | ICD-10-CM | POA: Diagnosis not present

## 2017-03-12 DIAGNOSIS — S098XXA Other specified injuries of head, initial encounter: Secondary | ICD-10-CM | POA: Diagnosis not present

## 2017-03-12 DIAGNOSIS — M6281 Muscle weakness (generalized): Secondary | ICD-10-CM | POA: Diagnosis not present

## 2017-03-12 DIAGNOSIS — R278 Other lack of coordination: Secondary | ICD-10-CM | POA: Diagnosis not present

## 2017-03-12 DIAGNOSIS — F0391 Unspecified dementia with behavioral disturbance: Secondary | ICD-10-CM | POA: Diagnosis not present

## 2017-03-12 DIAGNOSIS — R41841 Cognitive communication deficit: Secondary | ICD-10-CM | POA: Diagnosis not present

## 2017-03-15 DIAGNOSIS — R1312 Dysphagia, oropharyngeal phase: Secondary | ICD-10-CM | POA: Diagnosis not present

## 2017-03-15 DIAGNOSIS — F0391 Unspecified dementia with behavioral disturbance: Secondary | ICD-10-CM | POA: Diagnosis not present

## 2017-03-15 DIAGNOSIS — S72002D Fracture of unspecified part of neck of left femur, subsequent encounter for closed fracture with routine healing: Secondary | ICD-10-CM | POA: Diagnosis not present

## 2017-03-15 DIAGNOSIS — M6281 Muscle weakness (generalized): Secondary | ICD-10-CM | POA: Diagnosis not present

## 2017-03-15 DIAGNOSIS — R41841 Cognitive communication deficit: Secondary | ICD-10-CM | POA: Diagnosis not present

## 2017-03-15 DIAGNOSIS — R278 Other lack of coordination: Secondary | ICD-10-CM | POA: Diagnosis not present

## 2017-03-16 DIAGNOSIS — R278 Other lack of coordination: Secondary | ICD-10-CM | POA: Diagnosis not present

## 2017-03-16 DIAGNOSIS — S72002D Fracture of unspecified part of neck of left femur, subsequent encounter for closed fracture with routine healing: Secondary | ICD-10-CM | POA: Diagnosis not present

## 2017-03-16 DIAGNOSIS — F0391 Unspecified dementia with behavioral disturbance: Secondary | ICD-10-CM | POA: Diagnosis not present

## 2017-03-16 DIAGNOSIS — M6281 Muscle weakness (generalized): Secondary | ICD-10-CM | POA: Diagnosis not present

## 2017-03-16 DIAGNOSIS — R1312 Dysphagia, oropharyngeal phase: Secondary | ICD-10-CM | POA: Diagnosis not present

## 2017-03-16 DIAGNOSIS — R41841 Cognitive communication deficit: Secondary | ICD-10-CM | POA: Diagnosis not present

## 2017-03-17 DIAGNOSIS — M6281 Muscle weakness (generalized): Secondary | ICD-10-CM | POA: Diagnosis not present

## 2017-03-17 DIAGNOSIS — R1312 Dysphagia, oropharyngeal phase: Secondary | ICD-10-CM | POA: Diagnosis not present

## 2017-03-17 DIAGNOSIS — S72002D Fracture of unspecified part of neck of left femur, subsequent encounter for closed fracture with routine healing: Secondary | ICD-10-CM | POA: Diagnosis not present

## 2017-03-17 DIAGNOSIS — R278 Other lack of coordination: Secondary | ICD-10-CM | POA: Diagnosis not present

## 2017-03-17 DIAGNOSIS — R41841 Cognitive communication deficit: Secondary | ICD-10-CM | POA: Diagnosis not present

## 2017-03-17 DIAGNOSIS — F0391 Unspecified dementia with behavioral disturbance: Secondary | ICD-10-CM | POA: Diagnosis not present

## 2017-03-19 DIAGNOSIS — R278 Other lack of coordination: Secondary | ICD-10-CM | POA: Diagnosis not present

## 2017-03-19 DIAGNOSIS — R41841 Cognitive communication deficit: Secondary | ICD-10-CM | POA: Diagnosis not present

## 2017-03-19 DIAGNOSIS — M6281 Muscle weakness (generalized): Secondary | ICD-10-CM | POA: Diagnosis not present

## 2017-03-19 DIAGNOSIS — F0391 Unspecified dementia with behavioral disturbance: Secondary | ICD-10-CM | POA: Diagnosis not present

## 2017-03-19 DIAGNOSIS — S72002D Fracture of unspecified part of neck of left femur, subsequent encounter for closed fracture with routine healing: Secondary | ICD-10-CM | POA: Diagnosis not present

## 2017-03-19 DIAGNOSIS — R1312 Dysphagia, oropharyngeal phase: Secondary | ICD-10-CM | POA: Diagnosis not present

## 2017-03-22 DIAGNOSIS — M6281 Muscle weakness (generalized): Secondary | ICD-10-CM | POA: Diagnosis not present

## 2017-03-22 DIAGNOSIS — F0391 Unspecified dementia with behavioral disturbance: Secondary | ICD-10-CM | POA: Diagnosis not present

## 2017-03-22 DIAGNOSIS — R1312 Dysphagia, oropharyngeal phase: Secondary | ICD-10-CM | POA: Diagnosis not present

## 2017-03-22 DIAGNOSIS — R278 Other lack of coordination: Secondary | ICD-10-CM | POA: Diagnosis not present

## 2017-03-22 DIAGNOSIS — S72002D Fracture of unspecified part of neck of left femur, subsequent encounter for closed fracture with routine healing: Secondary | ICD-10-CM | POA: Diagnosis not present

## 2017-03-22 DIAGNOSIS — R41841 Cognitive communication deficit: Secondary | ICD-10-CM | POA: Diagnosis not present

## 2017-03-23 DIAGNOSIS — F0391 Unspecified dementia with behavioral disturbance: Secondary | ICD-10-CM | POA: Diagnosis not present

## 2017-03-23 DIAGNOSIS — R1312 Dysphagia, oropharyngeal phase: Secondary | ICD-10-CM | POA: Diagnosis not present

## 2017-03-23 DIAGNOSIS — R278 Other lack of coordination: Secondary | ICD-10-CM | POA: Diagnosis not present

## 2017-03-23 DIAGNOSIS — M6281 Muscle weakness (generalized): Secondary | ICD-10-CM | POA: Diagnosis not present

## 2017-03-23 DIAGNOSIS — R41841 Cognitive communication deficit: Secondary | ICD-10-CM | POA: Diagnosis not present

## 2017-03-23 DIAGNOSIS — S72002D Fracture of unspecified part of neck of left femur, subsequent encounter for closed fracture with routine healing: Secondary | ICD-10-CM | POA: Diagnosis not present

## 2017-03-25 DIAGNOSIS — S72002D Fracture of unspecified part of neck of left femur, subsequent encounter for closed fracture with routine healing: Secondary | ICD-10-CM | POA: Diagnosis not present

## 2017-03-25 DIAGNOSIS — R278 Other lack of coordination: Secondary | ICD-10-CM | POA: Diagnosis not present

## 2017-03-25 DIAGNOSIS — R1312 Dysphagia, oropharyngeal phase: Secondary | ICD-10-CM | POA: Diagnosis not present

## 2017-03-25 DIAGNOSIS — F0391 Unspecified dementia with behavioral disturbance: Secondary | ICD-10-CM | POA: Diagnosis not present

## 2017-03-25 DIAGNOSIS — R41841 Cognitive communication deficit: Secondary | ICD-10-CM | POA: Diagnosis not present

## 2017-03-25 DIAGNOSIS — M6281 Muscle weakness (generalized): Secondary | ICD-10-CM | POA: Diagnosis not present

## 2017-03-26 DIAGNOSIS — R41841 Cognitive communication deficit: Secondary | ICD-10-CM | POA: Diagnosis not present

## 2017-03-26 DIAGNOSIS — S72002D Fracture of unspecified part of neck of left femur, subsequent encounter for closed fracture with routine healing: Secondary | ICD-10-CM | POA: Diagnosis not present

## 2017-03-26 DIAGNOSIS — R278 Other lack of coordination: Secondary | ICD-10-CM | POA: Diagnosis not present

## 2017-03-26 DIAGNOSIS — R1312 Dysphagia, oropharyngeal phase: Secondary | ICD-10-CM | POA: Diagnosis not present

## 2017-03-26 DIAGNOSIS — M6281 Muscle weakness (generalized): Secondary | ICD-10-CM | POA: Diagnosis not present

## 2017-03-26 DIAGNOSIS — F0391 Unspecified dementia with behavioral disturbance: Secondary | ICD-10-CM | POA: Diagnosis not present

## 2017-03-29 DIAGNOSIS — F0391 Unspecified dementia with behavioral disturbance: Secondary | ICD-10-CM | POA: Diagnosis not present

## 2017-03-29 DIAGNOSIS — R278 Other lack of coordination: Secondary | ICD-10-CM | POA: Diagnosis not present

## 2017-03-29 DIAGNOSIS — R1312 Dysphagia, oropharyngeal phase: Secondary | ICD-10-CM | POA: Diagnosis not present

## 2017-03-29 DIAGNOSIS — S72002D Fracture of unspecified part of neck of left femur, subsequent encounter for closed fracture with routine healing: Secondary | ICD-10-CM | POA: Diagnosis not present

## 2017-03-29 DIAGNOSIS — M6281 Muscle weakness (generalized): Secondary | ICD-10-CM | POA: Diagnosis not present

## 2017-03-29 DIAGNOSIS — R41841 Cognitive communication deficit: Secondary | ICD-10-CM | POA: Diagnosis not present

## 2017-03-30 DIAGNOSIS — S72002D Fracture of unspecified part of neck of left femur, subsequent encounter for closed fracture with routine healing: Secondary | ICD-10-CM | POA: Diagnosis not present

## 2017-03-30 DIAGNOSIS — R1312 Dysphagia, oropharyngeal phase: Secondary | ICD-10-CM | POA: Diagnosis not present

## 2017-03-30 DIAGNOSIS — M6281 Muscle weakness (generalized): Secondary | ICD-10-CM | POA: Diagnosis not present

## 2017-03-30 DIAGNOSIS — R41841 Cognitive communication deficit: Secondary | ICD-10-CM | POA: Diagnosis not present

## 2017-03-30 DIAGNOSIS — R278 Other lack of coordination: Secondary | ICD-10-CM | POA: Diagnosis not present

## 2017-03-30 DIAGNOSIS — F0391 Unspecified dementia with behavioral disturbance: Secondary | ICD-10-CM | POA: Diagnosis not present

## 2017-03-31 DIAGNOSIS — M6281 Muscle weakness (generalized): Secondary | ICD-10-CM | POA: Diagnosis not present

## 2017-03-31 DIAGNOSIS — S72002D Fracture of unspecified part of neck of left femur, subsequent encounter for closed fracture with routine healing: Secondary | ICD-10-CM | POA: Diagnosis not present

## 2017-03-31 DIAGNOSIS — R41841 Cognitive communication deficit: Secondary | ICD-10-CM | POA: Diagnosis not present

## 2017-03-31 DIAGNOSIS — R1312 Dysphagia, oropharyngeal phase: Secondary | ICD-10-CM | POA: Diagnosis not present

## 2017-03-31 DIAGNOSIS — R278 Other lack of coordination: Secondary | ICD-10-CM | POA: Diagnosis not present

## 2017-03-31 DIAGNOSIS — F0391 Unspecified dementia with behavioral disturbance: Secondary | ICD-10-CM | POA: Diagnosis not present

## 2017-04-01 DIAGNOSIS — F0391 Unspecified dementia with behavioral disturbance: Secondary | ICD-10-CM | POA: Diagnosis not present

## 2017-04-01 DIAGNOSIS — M6281 Muscle weakness (generalized): Secondary | ICD-10-CM | POA: Diagnosis not present

## 2017-04-01 DIAGNOSIS — R278 Other lack of coordination: Secondary | ICD-10-CM | POA: Diagnosis not present

## 2017-04-01 DIAGNOSIS — R41841 Cognitive communication deficit: Secondary | ICD-10-CM | POA: Diagnosis not present

## 2017-04-01 DIAGNOSIS — R1312 Dysphagia, oropharyngeal phase: Secondary | ICD-10-CM | POA: Diagnosis not present

## 2017-04-01 DIAGNOSIS — S72002D Fracture of unspecified part of neck of left femur, subsequent encounter for closed fracture with routine healing: Secondary | ICD-10-CM | POA: Diagnosis not present

## 2017-04-02 DIAGNOSIS — R1312 Dysphagia, oropharyngeal phase: Secondary | ICD-10-CM | POA: Diagnosis not present

## 2017-04-02 DIAGNOSIS — M6281 Muscle weakness (generalized): Secondary | ICD-10-CM | POA: Diagnosis not present

## 2017-04-02 DIAGNOSIS — F0391 Unspecified dementia with behavioral disturbance: Secondary | ICD-10-CM | POA: Diagnosis not present

## 2017-04-02 DIAGNOSIS — R278 Other lack of coordination: Secondary | ICD-10-CM | POA: Diagnosis not present

## 2017-04-02 DIAGNOSIS — R41841 Cognitive communication deficit: Secondary | ICD-10-CM | POA: Diagnosis not present

## 2017-04-02 DIAGNOSIS — S72002D Fracture of unspecified part of neck of left femur, subsequent encounter for closed fracture with routine healing: Secondary | ICD-10-CM | POA: Diagnosis not present

## 2017-04-08 DIAGNOSIS — R41841 Cognitive communication deficit: Secondary | ICD-10-CM | POA: Diagnosis not present

## 2017-04-08 DIAGNOSIS — M6281 Muscle weakness (generalized): Secondary | ICD-10-CM | POA: Diagnosis not present

## 2017-04-08 DIAGNOSIS — S72002D Fracture of unspecified part of neck of left femur, subsequent encounter for closed fracture with routine healing: Secondary | ICD-10-CM | POA: Diagnosis not present

## 2017-04-08 DIAGNOSIS — R1312 Dysphagia, oropharyngeal phase: Secondary | ICD-10-CM | POA: Diagnosis not present

## 2017-04-08 DIAGNOSIS — R278 Other lack of coordination: Secondary | ICD-10-CM | POA: Diagnosis not present

## 2017-04-08 DIAGNOSIS — F0391 Unspecified dementia with behavioral disturbance: Secondary | ICD-10-CM | POA: Diagnosis not present

## 2017-04-09 DIAGNOSIS — R278 Other lack of coordination: Secondary | ICD-10-CM | POA: Diagnosis not present

## 2017-04-09 DIAGNOSIS — R1312 Dysphagia, oropharyngeal phase: Secondary | ICD-10-CM | POA: Diagnosis not present

## 2017-04-09 DIAGNOSIS — S72002D Fracture of unspecified part of neck of left femur, subsequent encounter for closed fracture with routine healing: Secondary | ICD-10-CM | POA: Diagnosis not present

## 2017-04-09 DIAGNOSIS — R41841 Cognitive communication deficit: Secondary | ICD-10-CM | POA: Diagnosis not present

## 2017-04-09 DIAGNOSIS — M6281 Muscle weakness (generalized): Secondary | ICD-10-CM | POA: Diagnosis not present

## 2017-04-09 DIAGNOSIS — F0391 Unspecified dementia with behavioral disturbance: Secondary | ICD-10-CM | POA: Diagnosis not present

## 2017-04-12 DIAGNOSIS — F0391 Unspecified dementia with behavioral disturbance: Secondary | ICD-10-CM | POA: Diagnosis not present

## 2017-04-12 DIAGNOSIS — S72002D Fracture of unspecified part of neck of left femur, subsequent encounter for closed fracture with routine healing: Secondary | ICD-10-CM | POA: Diagnosis not present

## 2017-04-12 DIAGNOSIS — R41841 Cognitive communication deficit: Secondary | ICD-10-CM | POA: Diagnosis not present

## 2017-04-12 DIAGNOSIS — R1312 Dysphagia, oropharyngeal phase: Secondary | ICD-10-CM | POA: Diagnosis not present

## 2017-04-12 DIAGNOSIS — M6281 Muscle weakness (generalized): Secondary | ICD-10-CM | POA: Diagnosis not present

## 2017-04-12 DIAGNOSIS — R278 Other lack of coordination: Secondary | ICD-10-CM | POA: Diagnosis not present

## 2017-04-20 DIAGNOSIS — Z1389 Encounter for screening for other disorder: Secondary | ICD-10-CM | POA: Diagnosis not present

## 2017-04-20 DIAGNOSIS — R269 Unspecified abnormalities of gait and mobility: Secondary | ICD-10-CM | POA: Diagnosis not present

## 2017-04-20 DIAGNOSIS — E46 Unspecified protein-calorie malnutrition: Secondary | ICD-10-CM | POA: Diagnosis not present

## 2017-04-20 DIAGNOSIS — F0390 Unspecified dementia without behavioral disturbance: Secondary | ICD-10-CM | POA: Diagnosis not present

## 2017-04-20 DIAGNOSIS — F419 Anxiety disorder, unspecified: Secondary | ICD-10-CM | POA: Diagnosis not present

## 2017-04-23 DIAGNOSIS — F0391 Unspecified dementia with behavioral disturbance: Secondary | ICD-10-CM | POA: Diagnosis not present

## 2017-04-23 DIAGNOSIS — S72002D Fracture of unspecified part of neck of left femur, subsequent encounter for closed fracture with routine healing: Secondary | ICD-10-CM | POA: Diagnosis not present

## 2017-04-23 DIAGNOSIS — R278 Other lack of coordination: Secondary | ICD-10-CM | POA: Diagnosis not present

## 2017-04-23 DIAGNOSIS — M6281 Muscle weakness (generalized): Secondary | ICD-10-CM | POA: Diagnosis not present

## 2017-04-23 DIAGNOSIS — R1312 Dysphagia, oropharyngeal phase: Secondary | ICD-10-CM | POA: Diagnosis not present

## 2017-04-23 DIAGNOSIS — R41841 Cognitive communication deficit: Secondary | ICD-10-CM | POA: Diagnosis not present

## 2017-04-29 DIAGNOSIS — S72002D Fracture of unspecified part of neck of left femur, subsequent encounter for closed fracture with routine healing: Secondary | ICD-10-CM | POA: Diagnosis not present

## 2017-04-29 DIAGNOSIS — F0391 Unspecified dementia with behavioral disturbance: Secondary | ICD-10-CM | POA: Diagnosis not present

## 2017-04-29 DIAGNOSIS — R41841 Cognitive communication deficit: Secondary | ICD-10-CM | POA: Diagnosis not present

## 2017-04-29 DIAGNOSIS — M6281 Muscle weakness (generalized): Secondary | ICD-10-CM | POA: Diagnosis not present

## 2017-04-29 DIAGNOSIS — R1312 Dysphagia, oropharyngeal phase: Secondary | ICD-10-CM | POA: Diagnosis not present

## 2017-04-29 DIAGNOSIS — R278 Other lack of coordination: Secondary | ICD-10-CM | POA: Diagnosis not present

## 2017-05-02 DIAGNOSIS — Z87310 Personal history of (healed) osteoporosis fracture: Secondary | ICD-10-CM | POA: Diagnosis not present

## 2017-05-02 DIAGNOSIS — L89152 Pressure ulcer of sacral region, stage 2: Secondary | ICD-10-CM | POA: Diagnosis not present

## 2017-05-02 DIAGNOSIS — M6281 Muscle weakness (generalized): Secondary | ICD-10-CM | POA: Diagnosis not present

## 2017-05-02 DIAGNOSIS — R41841 Cognitive communication deficit: Secondary | ICD-10-CM | POA: Diagnosis not present

## 2017-05-02 DIAGNOSIS — F0391 Unspecified dementia with behavioral disturbance: Secondary | ICD-10-CM | POA: Diagnosis not present

## 2017-05-02 DIAGNOSIS — R1312 Dysphagia, oropharyngeal phase: Secondary | ICD-10-CM | POA: Diagnosis not present

## 2017-05-02 DIAGNOSIS — Z9181 History of falling: Secondary | ICD-10-CM | POA: Diagnosis not present

## 2017-05-04 DIAGNOSIS — L89152 Pressure ulcer of sacral region, stage 2: Secondary | ICD-10-CM | POA: Diagnosis not present

## 2017-05-04 DIAGNOSIS — R41841 Cognitive communication deficit: Secondary | ICD-10-CM | POA: Diagnosis not present

## 2017-05-04 DIAGNOSIS — M6281 Muscle weakness (generalized): Secondary | ICD-10-CM | POA: Diagnosis not present

## 2017-05-04 DIAGNOSIS — F0391 Unspecified dementia with behavioral disturbance: Secondary | ICD-10-CM | POA: Diagnosis not present

## 2017-05-04 DIAGNOSIS — R1312 Dysphagia, oropharyngeal phase: Secondary | ICD-10-CM | POA: Diagnosis not present

## 2017-05-04 DIAGNOSIS — Z87310 Personal history of (healed) osteoporosis fracture: Secondary | ICD-10-CM | POA: Diagnosis not present

## 2017-05-06 DIAGNOSIS — R1312 Dysphagia, oropharyngeal phase: Secondary | ICD-10-CM | POA: Diagnosis not present

## 2017-05-06 DIAGNOSIS — Z87310 Personal history of (healed) osteoporosis fracture: Secondary | ICD-10-CM | POA: Diagnosis not present

## 2017-05-06 DIAGNOSIS — R41841 Cognitive communication deficit: Secondary | ICD-10-CM | POA: Diagnosis not present

## 2017-05-06 DIAGNOSIS — L89152 Pressure ulcer of sacral region, stage 2: Secondary | ICD-10-CM | POA: Diagnosis not present

## 2017-05-06 DIAGNOSIS — M6281 Muscle weakness (generalized): Secondary | ICD-10-CM | POA: Diagnosis not present

## 2017-05-06 DIAGNOSIS — F0391 Unspecified dementia with behavioral disturbance: Secondary | ICD-10-CM | POA: Diagnosis not present

## 2017-05-10 DIAGNOSIS — Z87310 Personal history of (healed) osteoporosis fracture: Secondary | ICD-10-CM | POA: Diagnosis not present

## 2017-05-10 DIAGNOSIS — L89152 Pressure ulcer of sacral region, stage 2: Secondary | ICD-10-CM | POA: Diagnosis not present

## 2017-05-10 DIAGNOSIS — R41841 Cognitive communication deficit: Secondary | ICD-10-CM | POA: Diagnosis not present

## 2017-05-10 DIAGNOSIS — R1312 Dysphagia, oropharyngeal phase: Secondary | ICD-10-CM | POA: Diagnosis not present

## 2017-05-10 DIAGNOSIS — M6281 Muscle weakness (generalized): Secondary | ICD-10-CM | POA: Diagnosis not present

## 2017-05-10 DIAGNOSIS — F0391 Unspecified dementia with behavioral disturbance: Secondary | ICD-10-CM | POA: Diagnosis not present

## 2017-05-13 DIAGNOSIS — F0391 Unspecified dementia with behavioral disturbance: Secondary | ICD-10-CM | POA: Diagnosis not present

## 2017-05-13 DIAGNOSIS — R41841 Cognitive communication deficit: Secondary | ICD-10-CM | POA: Diagnosis not present

## 2017-05-13 DIAGNOSIS — R1312 Dysphagia, oropharyngeal phase: Secondary | ICD-10-CM | POA: Diagnosis not present

## 2017-05-13 DIAGNOSIS — Z87310 Personal history of (healed) osteoporosis fracture: Secondary | ICD-10-CM | POA: Diagnosis not present

## 2017-05-13 DIAGNOSIS — M6281 Muscle weakness (generalized): Secondary | ICD-10-CM | POA: Diagnosis not present

## 2017-05-13 DIAGNOSIS — L89152 Pressure ulcer of sacral region, stage 2: Secondary | ICD-10-CM | POA: Diagnosis not present

## 2017-05-17 DIAGNOSIS — L89152 Pressure ulcer of sacral region, stage 2: Secondary | ICD-10-CM | POA: Diagnosis not present

## 2017-05-17 DIAGNOSIS — M6281 Muscle weakness (generalized): Secondary | ICD-10-CM | POA: Diagnosis not present

## 2017-05-17 DIAGNOSIS — R1312 Dysphagia, oropharyngeal phase: Secondary | ICD-10-CM | POA: Diagnosis not present

## 2017-05-17 DIAGNOSIS — F0391 Unspecified dementia with behavioral disturbance: Secondary | ICD-10-CM | POA: Diagnosis not present

## 2017-05-17 DIAGNOSIS — R41841 Cognitive communication deficit: Secondary | ICD-10-CM | POA: Diagnosis not present

## 2017-05-17 DIAGNOSIS — Z87310 Personal history of (healed) osteoporosis fracture: Secondary | ICD-10-CM | POA: Diagnosis not present

## 2017-05-25 DIAGNOSIS — F0391 Unspecified dementia with behavioral disturbance: Secondary | ICD-10-CM | POA: Diagnosis not present

## 2017-05-25 DIAGNOSIS — M6281 Muscle weakness (generalized): Secondary | ICD-10-CM | POA: Diagnosis not present

## 2017-05-25 DIAGNOSIS — R41841 Cognitive communication deficit: Secondary | ICD-10-CM | POA: Diagnosis not present

## 2017-05-25 DIAGNOSIS — L89152 Pressure ulcer of sacral region, stage 2: Secondary | ICD-10-CM | POA: Diagnosis not present

## 2017-05-25 DIAGNOSIS — Z87310 Personal history of (healed) osteoporosis fracture: Secondary | ICD-10-CM | POA: Diagnosis not present

## 2017-05-25 DIAGNOSIS — R1312 Dysphagia, oropharyngeal phase: Secondary | ICD-10-CM | POA: Diagnosis not present

## 2017-06-02 DIAGNOSIS — F0391 Unspecified dementia with behavioral disturbance: Secondary | ICD-10-CM | POA: Diagnosis not present

## 2017-06-02 DIAGNOSIS — L89152 Pressure ulcer of sacral region, stage 2: Secondary | ICD-10-CM | POA: Diagnosis not present

## 2017-06-02 DIAGNOSIS — R1312 Dysphagia, oropharyngeal phase: Secondary | ICD-10-CM | POA: Diagnosis not present

## 2017-06-02 DIAGNOSIS — R41841 Cognitive communication deficit: Secondary | ICD-10-CM | POA: Diagnosis not present

## 2017-06-02 DIAGNOSIS — Z87310 Personal history of (healed) osteoporosis fracture: Secondary | ICD-10-CM | POA: Diagnosis not present

## 2017-06-02 DIAGNOSIS — M6281 Muscle weakness (generalized): Secondary | ICD-10-CM | POA: Diagnosis not present

## 2017-07-26 DIAGNOSIS — L8990 Pressure ulcer of unspecified site, unspecified stage: Secondary | ICD-10-CM | POA: Diagnosis not present

## 2017-07-26 DIAGNOSIS — F411 Generalized anxiety disorder: Secondary | ICD-10-CM | POA: Diagnosis not present

## 2017-07-26 DIAGNOSIS — F0390 Unspecified dementia without behavioral disturbance: Secondary | ICD-10-CM | POA: Diagnosis not present

## 2017-07-26 DIAGNOSIS — E46 Unspecified protein-calorie malnutrition: Secondary | ICD-10-CM | POA: Diagnosis not present

## 2017-07-28 DIAGNOSIS — Z7401 Bed confinement status: Secondary | ICD-10-CM | POA: Diagnosis not present

## 2017-07-28 DIAGNOSIS — F05 Delirium due to known physiological condition: Secondary | ICD-10-CM | POA: Diagnosis not present

## 2017-07-28 DIAGNOSIS — E46 Unspecified protein-calorie malnutrition: Secondary | ICD-10-CM | POA: Diagnosis not present

## 2017-07-28 DIAGNOSIS — L89153 Pressure ulcer of sacral region, stage 3: Secondary | ICD-10-CM | POA: Diagnosis not present

## 2017-07-28 DIAGNOSIS — Z9181 History of falling: Secondary | ICD-10-CM | POA: Diagnosis not present

## 2017-07-28 DIAGNOSIS — F0391 Unspecified dementia with behavioral disturbance: Secondary | ICD-10-CM | POA: Diagnosis not present

## 2017-07-28 DIAGNOSIS — F411 Generalized anxiety disorder: Secondary | ICD-10-CM | POA: Diagnosis not present

## 2017-08-02 DIAGNOSIS — L89153 Pressure ulcer of sacral region, stage 3: Secondary | ICD-10-CM | POA: Diagnosis not present

## 2017-08-02 DIAGNOSIS — E46 Unspecified protein-calorie malnutrition: Secondary | ICD-10-CM | POA: Diagnosis not present

## 2017-08-09 DIAGNOSIS — E46 Unspecified protein-calorie malnutrition: Secondary | ICD-10-CM | POA: Diagnosis not present

## 2017-08-09 DIAGNOSIS — L89153 Pressure ulcer of sacral region, stage 3: Secondary | ICD-10-CM | POA: Diagnosis not present

## 2017-08-20 DIAGNOSIS — L89153 Pressure ulcer of sacral region, stage 3: Secondary | ICD-10-CM | POA: Diagnosis not present

## 2017-08-20 DIAGNOSIS — E46 Unspecified protein-calorie malnutrition: Secondary | ICD-10-CM | POA: Diagnosis not present

## 2017-08-24 DIAGNOSIS — L89153 Pressure ulcer of sacral region, stage 3: Secondary | ICD-10-CM | POA: Diagnosis not present

## 2017-08-24 DIAGNOSIS — E46 Unspecified protein-calorie malnutrition: Secondary | ICD-10-CM | POA: Diagnosis not present

## 2017-09-04 DIAGNOSIS — I10 Essential (primary) hypertension: Secondary | ICD-10-CM | POA: Diagnosis not present

## 2017-09-04 DIAGNOSIS — R079 Chest pain, unspecified: Secondary | ICD-10-CM | POA: Diagnosis not present

## 2017-09-04 DIAGNOSIS — R410 Disorientation, unspecified: Secondary | ICD-10-CM | POA: Diagnosis not present

## 2017-09-04 DIAGNOSIS — R0902 Hypoxemia: Secondary | ICD-10-CM | POA: Diagnosis not present

## 2017-09-04 DIAGNOSIS — R0789 Other chest pain: Secondary | ICD-10-CM | POA: Diagnosis not present

## 2017-10-14 DIAGNOSIS — R627 Adult failure to thrive: Secondary | ICD-10-CM | POA: Diagnosis not present

## 2017-10-14 DIAGNOSIS — L8990 Pressure ulcer of unspecified site, unspecified stage: Secondary | ICD-10-CM | POA: Diagnosis not present

## 2017-10-14 DIAGNOSIS — F0390 Unspecified dementia without behavioral disturbance: Secondary | ICD-10-CM | POA: Diagnosis not present

## 2017-10-14 DIAGNOSIS — E46 Unspecified protein-calorie malnutrition: Secondary | ICD-10-CM | POA: Diagnosis not present

## 2017-10-14 DIAGNOSIS — F411 Generalized anxiety disorder: Secondary | ICD-10-CM | POA: Diagnosis not present

## 2017-10-14 DIAGNOSIS — Z23 Encounter for immunization: Secondary | ICD-10-CM | POA: Diagnosis not present

## 2018-01-19 DIAGNOSIS — E46 Unspecified protein-calorie malnutrition: Secondary | ICD-10-CM | POA: Diagnosis not present

## 2018-01-19 DIAGNOSIS — L89152 Pressure ulcer of sacral region, stage 2: Secondary | ICD-10-CM | POA: Diagnosis not present

## 2018-01-19 DIAGNOSIS — R627 Adult failure to thrive: Secondary | ICD-10-CM | POA: Diagnosis not present

## 2018-01-19 DIAGNOSIS — F0390 Unspecified dementia without behavioral disturbance: Secondary | ICD-10-CM | POA: Diagnosis not present

## 2018-05-03 DIAGNOSIS — F419 Anxiety disorder, unspecified: Secondary | ICD-10-CM | POA: Diagnosis not present

## 2018-05-03 DIAGNOSIS — E46 Unspecified protein-calorie malnutrition: Secondary | ICD-10-CM | POA: Diagnosis not present

## 2018-05-03 DIAGNOSIS — F0390 Unspecified dementia without behavioral disturbance: Secondary | ICD-10-CM | POA: Diagnosis not present

## 2018-05-03 DIAGNOSIS — F411 Generalized anxiety disorder: Secondary | ICD-10-CM | POA: Diagnosis not present

## 2018-05-05 ENCOUNTER — Emergency Department (HOSPITAL_COMMUNITY)
Admission: EM | Admit: 2018-05-05 | Discharge: 2018-05-05 | Disposition: A | Discharge status: Home / Self Care | Payer: Medicare Other | Attending: Emergency Medicine | Admitting: Emergency Medicine

## 2018-05-05 ENCOUNTER — Emergency Department (HOSPITAL_COMMUNITY): Payer: Medicare Other

## 2018-05-05 ENCOUNTER — Encounter (HOSPITAL_COMMUNITY): Payer: Self-pay | Admitting: Emergency Medicine

## 2018-05-05 DIAGNOSIS — F0390 Unspecified dementia without behavioral disturbance: Secondary | ICD-10-CM | POA: Diagnosis not present

## 2018-05-05 DIAGNOSIS — R52 Pain, unspecified: Secondary | ICD-10-CM | POA: Diagnosis not present

## 2018-05-05 DIAGNOSIS — M79671 Pain in right foot: Secondary | ICD-10-CM | POA: Diagnosis not present

## 2018-05-05 DIAGNOSIS — W19XXXA Unspecified fall, initial encounter: Secondary | ICD-10-CM | POA: Insufficient documentation

## 2018-05-05 DIAGNOSIS — R220 Localized swelling, mass and lump, head: Secondary | ICD-10-CM | POA: Diagnosis not present

## 2018-05-05 DIAGNOSIS — S199XXA Unspecified injury of neck, initial encounter: Secondary | ICD-10-CM | POA: Diagnosis not present

## 2018-05-05 DIAGNOSIS — S79912A Unspecified injury of left hip, initial encounter: Secondary | ICD-10-CM | POA: Diagnosis not present

## 2018-05-05 DIAGNOSIS — M7989 Other specified soft tissue disorders: Secondary | ICD-10-CM | POA: Diagnosis not present

## 2018-05-05 DIAGNOSIS — S3289XA Fracture of other parts of pelvis, initial encounter for closed fracture: Secondary | ICD-10-CM | POA: Diagnosis not present

## 2018-05-05 DIAGNOSIS — Z87891 Personal history of nicotine dependence: Secondary | ICD-10-CM | POA: Insufficient documentation

## 2018-05-05 DIAGNOSIS — M25551 Pain in right hip: Secondary | ICD-10-CM | POA: Diagnosis not present

## 2018-05-05 DIAGNOSIS — S0990XA Unspecified injury of head, initial encounter: Secondary | ICD-10-CM | POA: Diagnosis not present

## 2018-05-05 DIAGNOSIS — M5489 Other dorsalgia: Secondary | ICD-10-CM | POA: Diagnosis not present

## 2018-05-05 DIAGNOSIS — Z79899 Other long term (current) drug therapy: Secondary | ICD-10-CM | POA: Diagnosis not present

## 2018-05-05 DIAGNOSIS — M2550 Pain in unspecified joint: Secondary | ICD-10-CM | POA: Diagnosis not present

## 2018-05-05 DIAGNOSIS — I10 Essential (primary) hypertension: Secondary | ICD-10-CM | POA: Insufficient documentation

## 2018-05-05 DIAGNOSIS — M25552 Pain in left hip: Secondary | ICD-10-CM | POA: Insufficient documentation

## 2018-05-05 DIAGNOSIS — R4182 Altered mental status, unspecified: Secondary | ICD-10-CM | POA: Diagnosis not present

## 2018-05-05 DIAGNOSIS — R410 Disorientation, unspecified: Secondary | ICD-10-CM | POA: Diagnosis not present

## 2018-05-05 DIAGNOSIS — S99921A Unspecified injury of right foot, initial encounter: Secondary | ICD-10-CM | POA: Diagnosis not present

## 2018-05-05 DIAGNOSIS — S79911A Unspecified injury of right hip, initial encounter: Secondary | ICD-10-CM | POA: Diagnosis not present

## 2018-05-05 DIAGNOSIS — Z7401 Bed confinement status: Secondary | ICD-10-CM | POA: Diagnosis not present

## 2018-05-05 NOTE — ED Triage Notes (Signed)
Per EMS, pt with unwitnessed fall at home out of bed. Pt with c/o right shoulder, Left hip, back, and jaw pain. Right great toenail noted to be lifting. Toenail intact, bleeding controlled. Bruise noted to left knee. Pt with hx of dementia

## 2018-05-05 NOTE — ED Notes (Signed)
Spoke to [employer] pts daughter, she is aware that pt is coming home and states needs PTAR to assist, , PTAR called

## 2018-05-05 NOTE — ED Provider Notes (Signed)
[facility]-EMERGENCY DEPT Provider Note   CSN: [id number] Arrival date & time: 05/05/18  0449    History   Chief Complaint No chief complaint on file.   HPI Yolanda Shaw is a 85 y.o. female.     The history is provided by the EMS personnel. The history is limited by the condition of the patient (level 5 caveat dementia).  Fall  This is a recurrent problem. The current episode started less than 1 hour ago. The problem occurs constantly. The problem has not changed since onset.Pertinent negatives include no chest pain, no abdominal pain and no shortness of breath. Nothing aggravates the symptoms. Nothing relieves the symptoms. She has tried nothing for the symptoms. The treatment provided no relief.    Past Medical History:  Diagnosis Date   AKI (acute kidney injury) (HCC)    Anemia    low iron   Anxiety    panic attacks   Arthritis    Bilateral pubic rami fractures (HCC) 06/29/2014   Closed fracture of right proximal humerus    Closed stable fracture of multiple pubic rami (HCC) 02/17/2016   Dementia (HCC)    Dementia with behavioral disturbance (HCC)    Essential hypertension    Fracture of left pubis (HCC)    Ischium fracture (HCC)    Pelvic fracture (HCC) 06/30/2014   Pressure ulcer 06/30/2014   Thrombocytopenia (HCC) 06/30/2014   Tremor 06/30/2014    Patient Active Problem List   Diagnosis Date Noted   Acute blood loss anemia    Closed left hip fracture, initial encounter (HCC) 10/19/2016   Hip fracture (HCC) 10/19/2016   Anemia 03/02/2016   Closed stable fracture of multiple pubic rami (HCC) 02/17/2016   Closed fracture of right proximal humerus    Essential hypertension    AKI (acute kidney injury) (HCC)    Dementia with behavioral disturbance (HCC)    Pelvic fracture (HCC) 06/30/2014   Thrombocytopenia (HCC) 06/30/2014   Tremor 06/30/2014   Pressure ulcer 06/30/2014   Fracture of left pubis (HCC)     Ischium fracture (HCC)    Left hip pain    Bilateral pubic rami fractures (HCC) 06/29/2014   Atrial fibrillation (HCC) 03/02/2014    Past Surgical History:  Procedure Laterality Date   ADENOIDECTOMY     INTRAMEDULLARY (IM) NAIL INTERTROCHANTERIC Left 10/19/2016   Procedure: INTRAMEDULLARY (IM) NAIL INTERTROCHANTRIC;  Surgeon: Cammy Copa, MD;  Location: [facility] OR;  Service: Orthopedics;  Laterality: Left;   SHOULDER SURGERY     right   TONSILLECTOMY     WRIST SURGERY       OB History   No obstetric history on file.      Home Medications    Prior to Admission medications   Medication Sig Start Date End Date Taking? Authorizing Provider  acetaminophen (TYLENOL) 325 MG tablet Take 2 tablets (650 mg total) by mouth every 6 (six) hours as needed for mild pain or fever. 10/22/16   Hongalgi, Maximino Greenland, MD  ALPRAZolam Prudy Feeler) 0.25 MG tablet Take 1 tablet (0.25 mg total) by mouth 2 (two) times daily as needed for anxiety. 10/22/16   Elease Etienne, MD  aspirin EC 325 MG EC tablet Take 1 tablet (325 mg total) by mouth daily with breakfast. 10/23/16   Hongalgi, Maximino Greenland, MD  feeding supplement, ENSURE ENLIVE, (ENSURE ENLIVE) LIQD Take 237 mLs by mouth daily at 3 pm. 10/22/16   Hongalgi, Maximino Greenland, MD  HYDROcodone-acetaminophen (NORCO/VICODIN) 5-325 MG tablet  Take 1 tablet by mouth every 6 (six) hours as needed for moderate pain or severe pain. 10/22/16   Hongalgi, Maximino Greenland, MD  memantine (NAMENDA) 10 MG tablet Take 10 mg by mouth 2 (two) times daily.    [provider]  senna (SENOKOT) 8.6 MG TABS tablet Take 1 tablet (8.6 mg total) by mouth daily. Patient taking differently: Take 1 tablet by mouth daily as needed for mild constipation.  02/19/16   Cleora Fleet, MD    Family History Family History  Problem Relation Age of Onset   Hypertension Other     Social History Social History   Tobacco Use   Smoking status: Former Smoker    Types: Cigarettes     Last attempt to quit: 01/01/1947    Years since quitting: 71.3   Smokeless tobacco: Never Used  Substance Use Topics   Alcohol use: Yes    Comment: occassional wine   Drug use: No     Allergies   Ether; Amitriptyline; and Penicillins   Review of Systems Review of Systems  Unable to perform ROS: Dementia  Respiratory: Negative for shortness of breath.   Cardiovascular: Negative for chest pain.  Gastrointestinal: Negative for abdominal pain.     Physical Exam Updated Vital Signs There were no vitals taken for this visit.  Physical Exam Vitals signs and nursing note reviewed.  Constitutional:      General: She is not in acute distress.    Appearance: She is normal weight.  HENT:     Head: Normocephalic and atraumatic.     Right Ear: Tympanic membrane normal.     Left Ear: Tympanic membrane normal.     Nose: Nose normal.  Eyes:     Extraocular Movements: Extraocular movements intact.     Conjunctiva/sclera: Conjunctivae normal.     Pupils: Pupils are equal, round, and reactive to light.  Neck:     Musculoskeletal: No muscular tenderness.  Cardiovascular:     Rate and Rhythm: Normal rate and regular rhythm.     Pulses: Normal pulses.     Heart sounds: Normal heart sounds.  Pulmonary:     Effort: Pulmonary effort is normal.     Breath sounds: Normal breath sounds.  Abdominal:     General: Abdomen is flat. Bowel sounds are normal.     Tenderness: There is no abdominal tenderness. There is no guarding.     Hernia: No hernia is present.  Musculoskeletal: Normal range of motion.        General: No swelling, tenderness or deformity.     Comments: Loose right great toenail  Skin:    General: Skin is warm and dry.     Capillary Refill: Capillary refill takes less than 2 seconds.  Neurological:     General: No focal deficit present.     Mental Status: She is alert.     Deep Tendon Reflexes: Reflexes normal.  Psychiatric:        Behavior: Behavior normal.       ED Treatments / Results  Labs (all labs ordered are listed, but only abnormal results are displayed) Labs Reviewed - No data to display  EKG None  Radiology Results for orders placed or performed during the hospital encounter of 12/27/16  CBC with Differential  Result Value Ref Range   WBC 5.4 4.0 - 10.5 K/uL   RBC 4.00 3.87 - 5.11 MIL/uL   Hemoglobin 12.6 12.0 - 15.0 g/dL   HCT 21.3 08.6 -  46.0 %   MCV 94.3 78.0 - 100.0 fL   MCH 31.5 26.0 - 34.0 pg   MCHC 33.4 30.0 - 36.0 g/dL   RDW 16.3 84.5 - 36.4 %   Platelets 150 150 - 400 K/uL   Neutrophils Relative % 62 %   Neutro Abs 3.3 1.7 - 7.7 K/uL   Lymphocytes Relative 27 %   Lymphs Abs 1.4 0.7 - 4.0 K/uL   Monocytes Relative 8 %   Monocytes Absolute 0.5 0.1 - 1.0 K/uL   Eosinophils Relative 3 %   Eosinophils Absolute 0.2 0.0 - 0.7 K/uL   Basophils Relative 0 %   Basophils Absolute 0.0 0.0 - 0.1 K/uL  Basic metabolic panel  Result Value Ref Range   Sodium 138 135 - 145 mmol/L   Potassium 3.3 (L) 3.5 - 5.1 mmol/L   Chloride 105 101 - 111 mmol/L   CO2 24 22 - 32 mmol/L   Glucose, Bld 87 65 - 99 mg/dL   BUN 11 6 - 20 mg/dL   Creatinine, Ser 6.80 0.44 - 1.00 mg/dL   Calcium 8.8 (L) 8.9 - 10.3 mg/dL   GFR calc non Af Amer >60 >60 mL/min   GFR calc Af Amer >60 >60 mL/min   Anion gap 9 5 - 15   Ct Head Wo Contrast  Result Date: 05/05/2018 CLINICAL DATA:  Unwitnessed fall at home.  Initial encounter. EXAM: CT HEAD WITHOUT CONTRAST CT CERVICAL SPINE WITHOUT CONTRAST TECHNIQUE: Multidetector CT imaging of the head and cervical spine was performed following the standard protocol without intravenous contrast. Multiplanar CT image reconstructions of the cervical spine were also generated. COMPARISON:  CT head without contrast 10/19/2016 FINDINGS: CT HEAD FINDINGS Brain: Stable moderate atrophy and white matter disease is present. No acute infarct, hemorrhage, or mass lesion is present. The ventricles are proportionate to the  degree of atrophy. No significant extraaxial fluid collection is present. The brainstem and cerebellum are within normal limits. Vascular: No hyperdense vessel or unexpected calcification. Skull: Right frontal scalp nodule is stable, likely representing a sebaceous cyst. Soft tissue swelling is present over the left parietal scalp. There is no underlying fracture. Calvarium is intact. Sinuses/Orbits: The paranasal sinuses and mastoid air cells are clear. Bilateral lens replacements are noted. Globes and orbits are otherwise unremarkable. CT CERVICAL SPINE FINDINGS Alignment: Slight degenerative retrolisthesis at C2-3 is stable. Minimal degenerative anterolisthesis at C4-5 and C5-6 is stable. No traumatic subluxation is present. Skull base and vertebrae: Remote superior endplate fracture at T1 is stable. Vertebral body heights are otherwise maintained. Craniocervical junction is normal. No acute or healing fractures are evident. Soft tissues and spinal canal: Soft tissue imaging of the neck demonstrate some calcifications at the carotid bifurcations bilaterally. Heterogeneous thyroid is again noted. Dominant lesion in the left lobe of the thyroid measures 3.1 cm. Disc levels: Facet and uncovertebral spurring results an multilevel spondylosis. Most significant stenosis is on the left at C5-6. Upper chest: The lung apices are clear. Thoracic inlet is within normal limits. IMPRESSION: 1. Left parietal scalp soft tissue swelling may be related to trauma. 2. No underlying skull fracture. 3. No acute intracranial abnormality. 4. Stable atrophy and white matter disease. 5. Stable multilevel degenerative changes of the cervical spine. Electronically Signed   By: Marin Roberts M.D.   On: 05/05/2018 06:17   Ct Cervical Spine Wo Contrast  Result Date: 05/05/2018 CLINICAL DATA:  Unwitnessed fall at home.  Initial encounter. EXAM: CT HEAD WITHOUT CONTRAST CT CERVICAL SPINE  WITHOUT CONTRAST TECHNIQUE: Multidetector CT  imaging of the head and cervical spine was performed following the standard protocol without intravenous contrast. Multiplanar CT image reconstructions of the cervical spine were also generated. COMPARISON:  CT head without contrast 10/19/2016 FINDINGS: CT HEAD FINDINGS Brain: Stable moderate atrophy and white matter disease is present. No acute infarct, hemorrhage, or mass lesion is present. The ventricles are proportionate to the degree of atrophy. No significant extraaxial fluid collection is present. The brainstem and cerebellum are within normal limits. Vascular: No hyperdense vessel or unexpected calcification. Skull: Right frontal scalp nodule is stable, likely representing a sebaceous cyst. Soft tissue swelling is present over the left parietal scalp. There is no underlying fracture. Calvarium is intact. Sinuses/Orbits: The paranasal sinuses and mastoid air cells are clear. Bilateral lens replacements are noted. Globes and orbits are otherwise unremarkable. CT CERVICAL SPINE FINDINGS Alignment: Slight degenerative retrolisthesis at C2-3 is stable. Minimal degenerative anterolisthesis at C4-5 and C5-6 is stable. No traumatic subluxation is present. Skull base and vertebrae: Remote superior endplate fracture at T1 is stable. Vertebral body heights are otherwise maintained. Craniocervical junction is normal. No acute or healing fractures are evident. Soft tissues and spinal canal: Soft tissue imaging of the neck demonstrate some calcifications at the carotid bifurcations bilaterally. Heterogeneous thyroid is again noted. Dominant lesion in the left lobe of the thyroid measures 3.1 cm. Disc levels: Facet and uncovertebral spurring results an multilevel spondylosis. Most significant stenosis is on the left at C5-6. Upper chest: The lung apices are clear. Thoracic inlet is within normal limits. IMPRESSION: 1. Left parietal scalp soft tissue swelling may be related to trauma. 2. No underlying skull fracture. 3. No  acute intracranial abnormality. 4. Stable atrophy and white matter disease. 5. Stable multilevel degenerative changes of the cervical spine. Electronically Signed   By: Marin Roberts M.D.   On: 05/05/2018 06:17   Ct Hip Right Wo Contrast  Result Date: 05/05/2018 CLINICAL DATA:  Fall.  Right hip pain.  Abnormal x-ray. EXAM: CT OF THE RIGHT HIP WITHOUT CONTRAST TECHNIQUE: Multidetector CT imaging of the right hip was performed according to the standard protocol. Multiplanar CT image reconstructions were also generated. COMPARISON:  Hip radiographs 05/05/18 FINDINGS: Bones/Joint/Cartilage Right femoral neck is intact. No acute fracture is present. There is a remote fracture involving the right superior and inferior pubic rami. Degenerative changes are present at the right hip. Ligaments Suboptimally assessed by CT. Muscles and Tendons No acute injury. Soft tissues Minimal atherosclerotic changes are noted within the right external iliac artery. Moderate stool is present in the colon. IMPRESSION: 1. No acute fracture. 2. Remote right pubic fractures. 3. Atherosclerosis. Electronically Signed   By: Marin Roberts M.D.   On: 05/05/2018 06:49   Dg Foot Complete Right  Result Date: 05/05/2018 CLINICAL DATA:  Unwitnessed fall. Right foot pain. Initial encounter. EXAM: RIGHT FOOT COMPLETE - 3+ VIEW COMPARISON:  None. FINDINGS: Mild osteopenia is present. No acute or healing fractures are present. Soft tissue swelling is present at the great toe and first MCP joint. This may be chronic. IMPRESSION: 1. Soft swelling involving the great toe. 2. No acute osseous abnormality. Electronically Signed   By: Marin Roberts M.D.   On: 05/05/2018 06:10   Dg Hips Bilat W Or Wo Pelvis 3-4 Views  Result Date: 05/05/2018 CLINICAL DATA:  Unwitnessed fall at nursing home.  Left hip pain. EXAM: DG HIP (WITH OR WITHOUT PELVIS) 3-4V BILAT COMPARISON:  None FINDINGS: Left hip ORIF is intact.  The left hip is located. A  right subcapital femoral neck fracture is suspected. Moderate degenerative changes are noted in the right hip. Moderate stool is present in the colon. Atherosclerotic changes are noted. IMPRESSION: 1. Question right subcapital femoral neck fracture without significant displacement. CT could be used for further evaluation of the right hip if clinically indicated. 2. Advanced degenerative changes of the right hip. 3. Status post left hip ORIF without recurrent fracture. Electronically Signed   By: Marin Robertshristopher  Mattern M.D.   On: 05/05/2018 06:09    Procedures Procedures (including critical care time)  Medications Ordered in ED Medications - No data to display   Exam is benign and reassuring.  No fractures or internal trauma.  Stable for discharge with close follow up  Final Clinical Impressions(s) / ED Diagnoses    Return for intractable cough, coughing up blood,fevers >100.4 unrelieved by medication, shortness of breath, intractable vomiting, chest pain, shortness of breath, weakness,numbness, changes in speech, facial asymmetry,abdominal pain, passing out,Inability to tolerate liquids or food, cough, altered mental status or any concerns. No signs of systemic illness or infection. The patient is nontoxic-appearing on exam and vital signs are within normal limits.   I have reviewed the triage vital signs and the nursing notes. Pertinent labs &imaging results that were available during my care of the patient were reviewed by me and considered in my medical decision making (see chart for details).  After history, exam, and medical workup I feel the patient has been appropriately medically screened and is safe for discharge home. Pertinent diagnoses were discussed with the patient. Patient was given return precautions.   Karsen Fellows, MD 05/05/18 [id number]

## 2018-05-05 NOTE — ED Notes (Signed)
Bed: WA20 Expected date:  Expected time:  Means of arrival:  Comments: EMS 85 yo female from home/hx dementia-hip and head pain after unwitnessed fall-no blood thinners

## 2018-05-05 NOTE — ED Notes (Signed)
Ptar here, report called to daughter d/c instructions

## 2018-08-01 IMAGING — CR DG KNEE COMPLETE 4+V*R*
4 series · 4 of 4 positions shown · non-contrast
Comparison: None.

CLINICAL DATA: Pain after a fall this evening.

EXAM:
RIGHT KNEE - COMPLETE 4+ VIEW

[x knee ap right (1 of 3)]
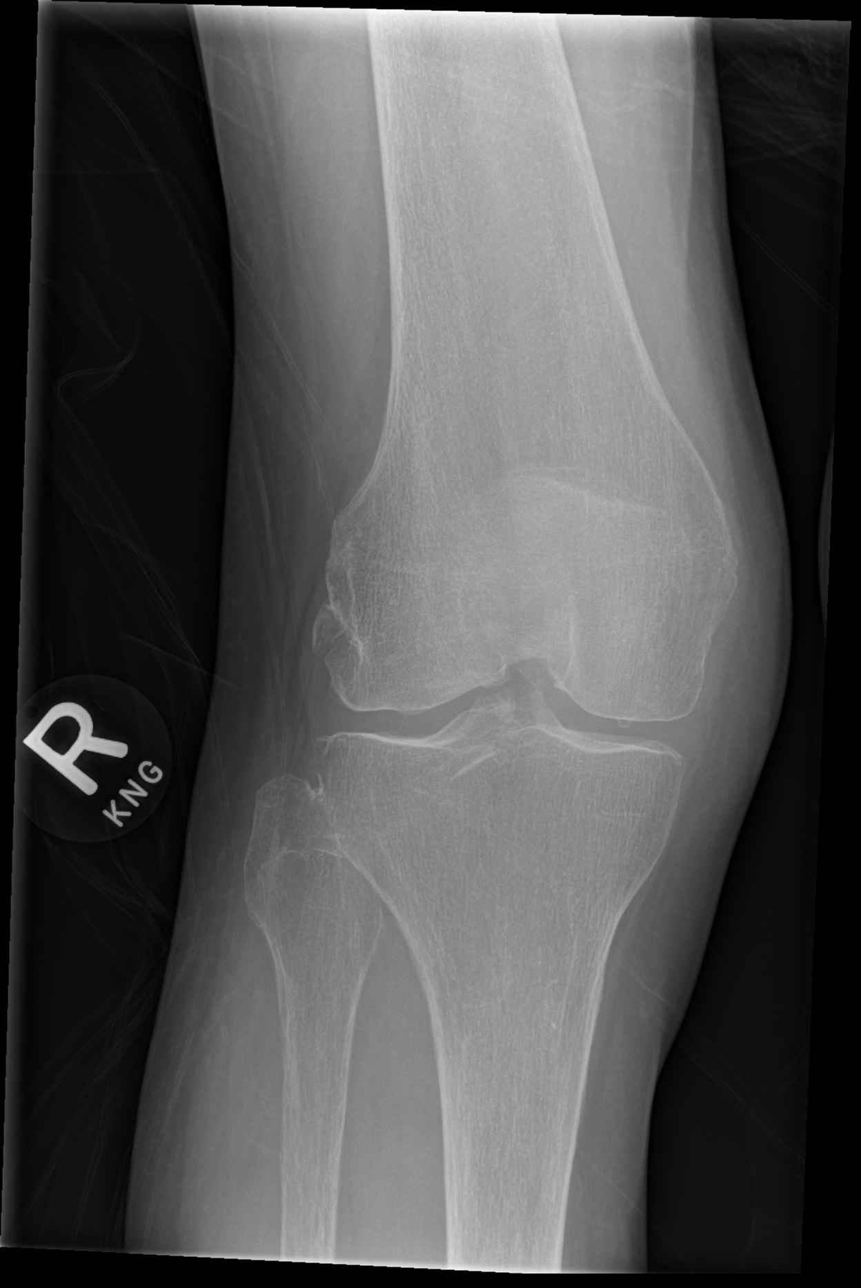

[x knee ap right (2 of 3)]
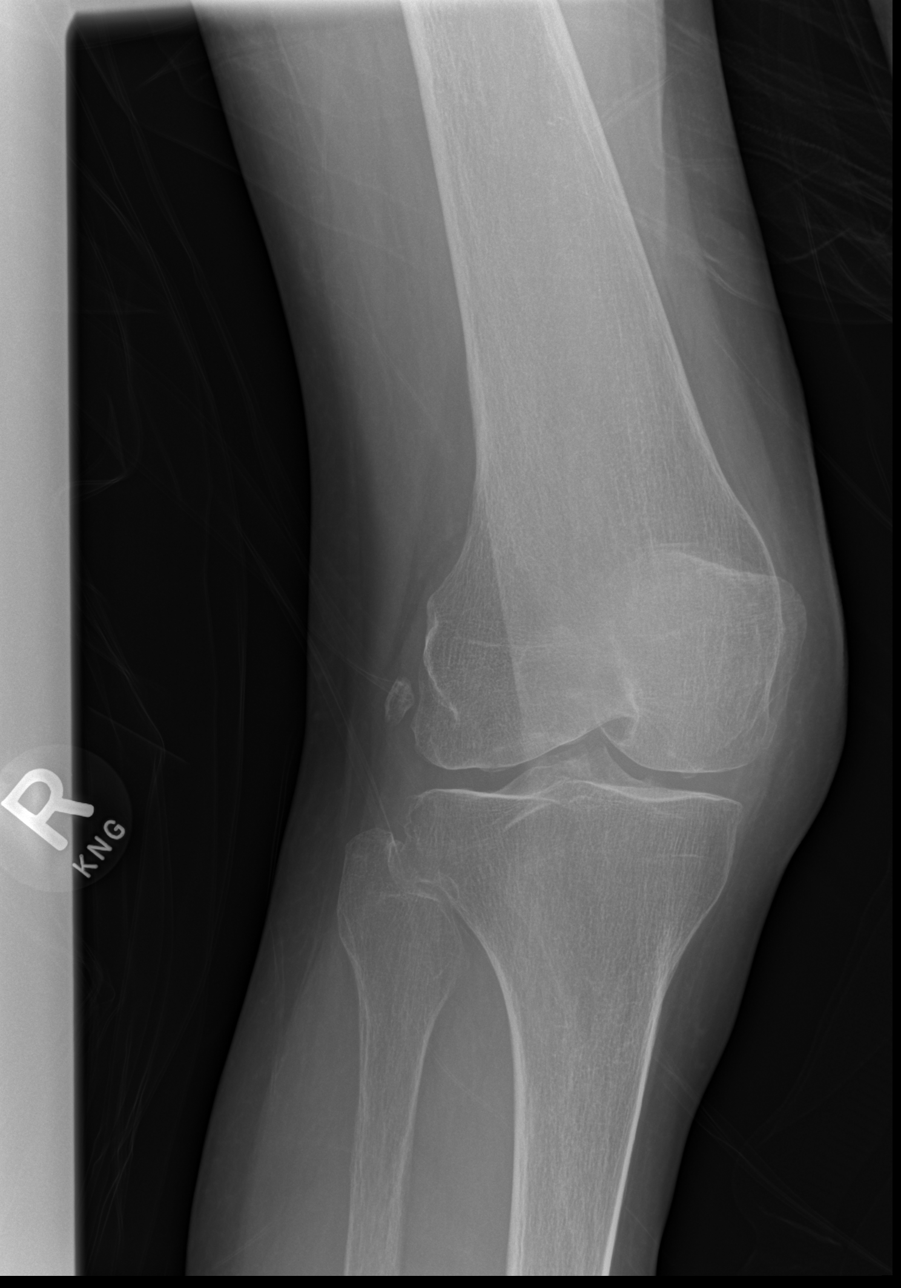

[x knee ap right (3 of 3)]
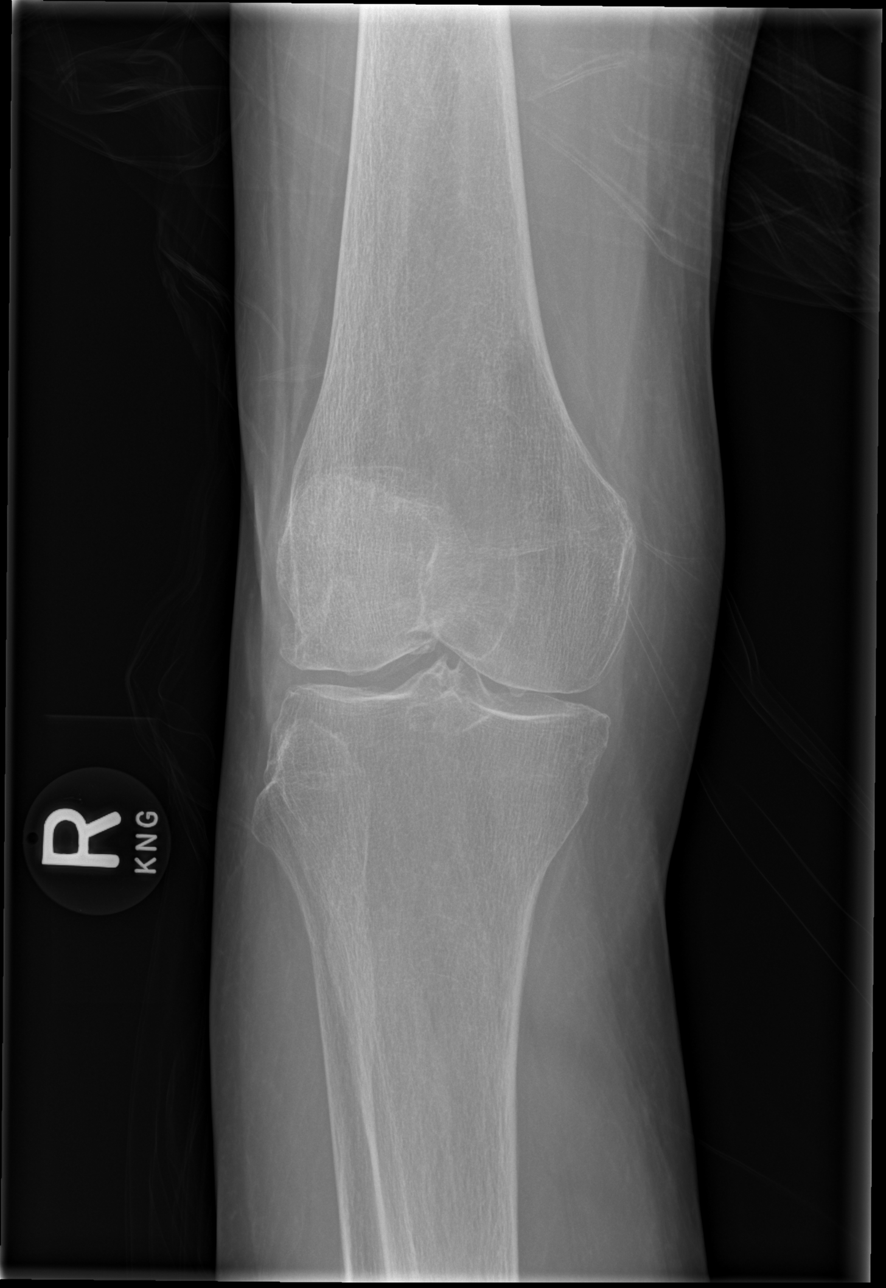

[x knee lat right]
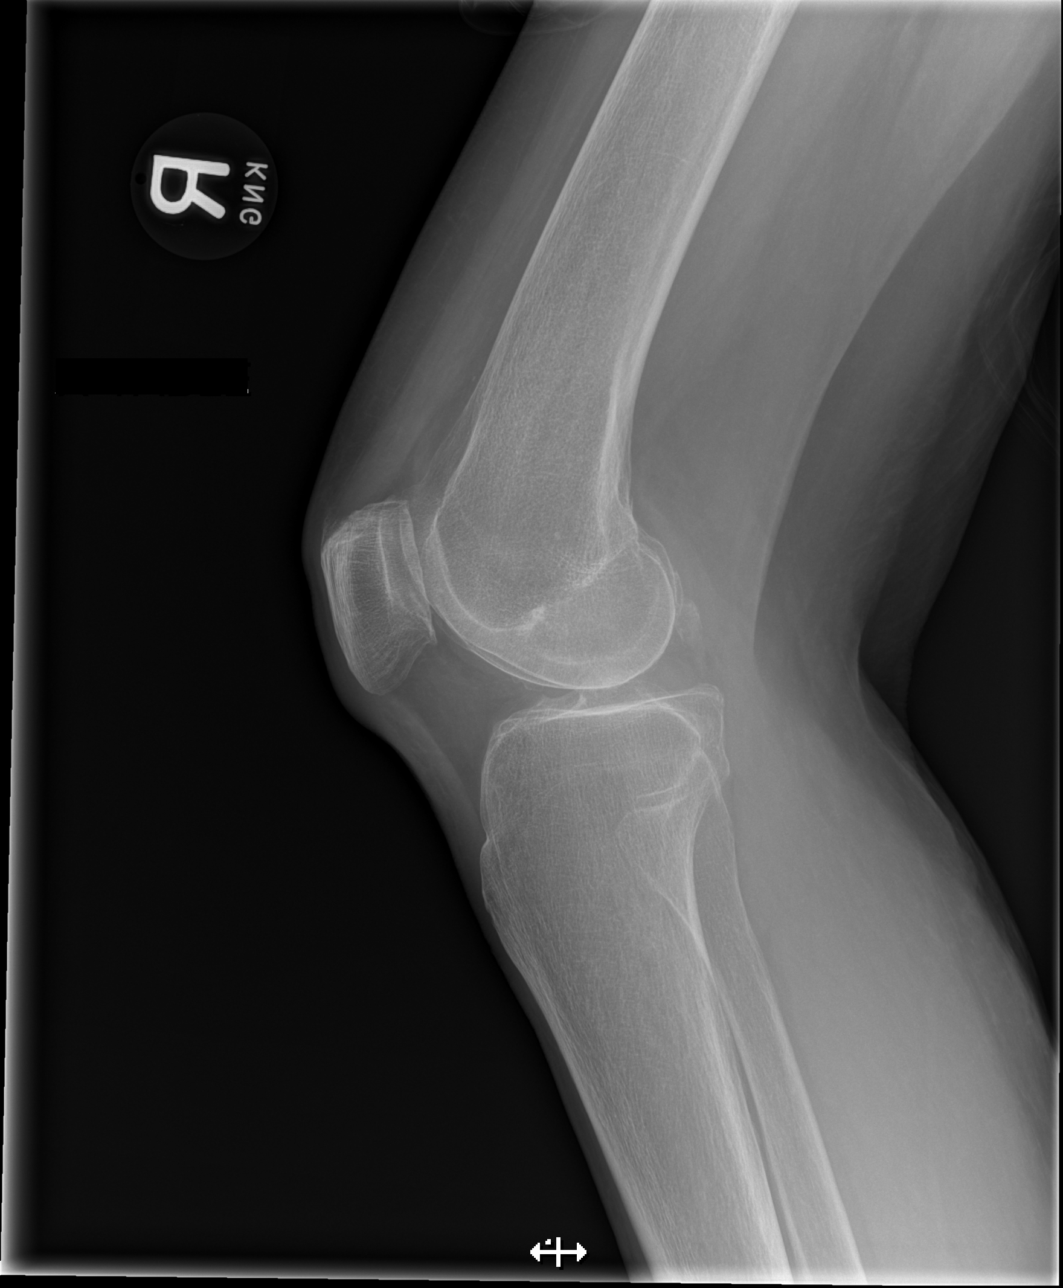

[4 of 4 positions shown; findings below may reference images not displayed]

FINDINGS: Degenerative changes in the right knee. Slight contour
irregularities on the femoral condyles likely representing
osteochondral defects. No evidence of acute fracture or dislocation.
No significant effusion. Soft tissues are unremarkable.
IMPRESSION: No acute bony abnormalities. Degenerative changes in the knee with
probable osteochondral defects.

## 2018-08-01 IMAGING — CT CT CERVICAL SPINE W/O CM
4 of 7 series · 13 of 33 positions shown, 14 images · non-contrast
Comparison: 10/08/2015 CT of the head.

CLINICAL DATA: 84 y/o F; status post fall with pain to the
right-sided neck.

EXAM:
CT HEAD WITHOUT CONTRAST
CT CERVICAL SPINE WITHOUT CONTRAST
TECHNIQUE: Multidetector CT imaging of the head and cervical spine was
performed following the standard protocol without intravenous
contrast. Multiplanar CT image reconstructions of the cervical spine
were also generated.

[Series 5: coronal · coronal · 0.27mm/px · 3 of 62 slices shown]
[im 13/62  bone]
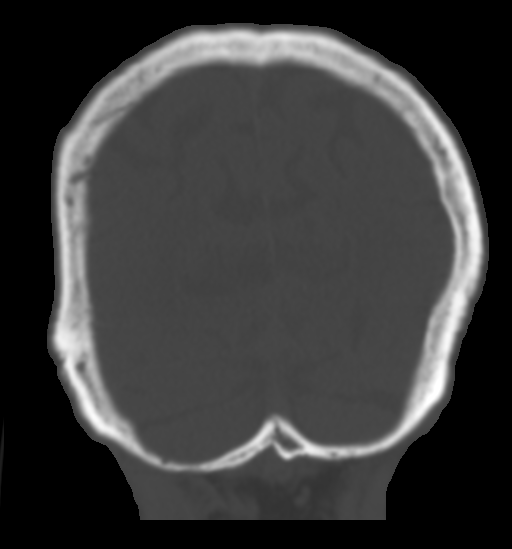
[im 25/62  bone]
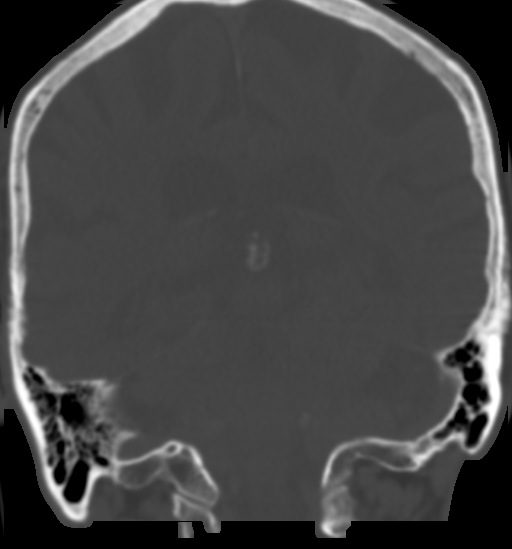
[im 37/62  bone]
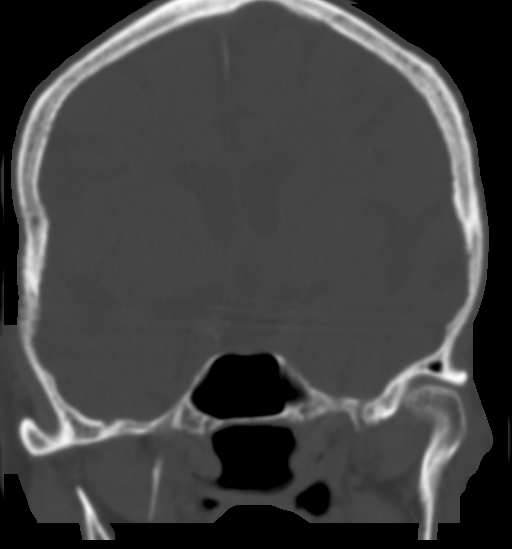

[Series 6: sagittal · sagittal · 0.31mm/px · 5 of 47 slices shown]
[im 8/47  bone]
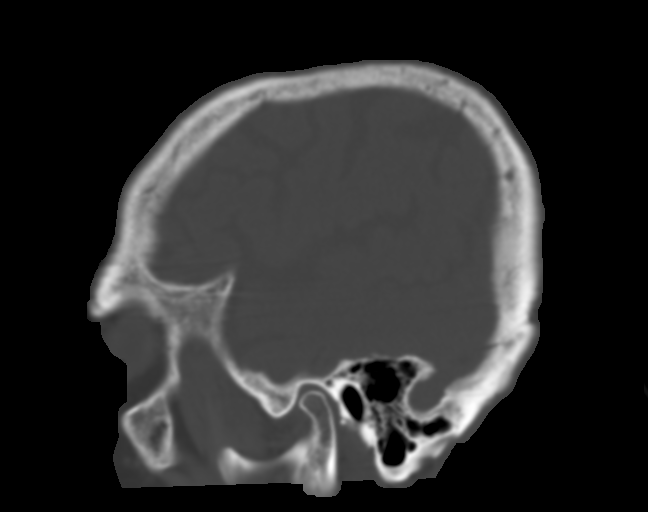
[im 16/47  bone]
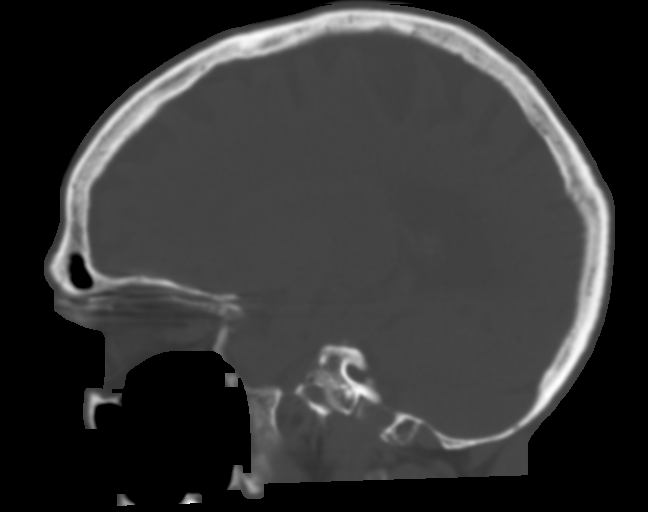
[im 24/47  bone]
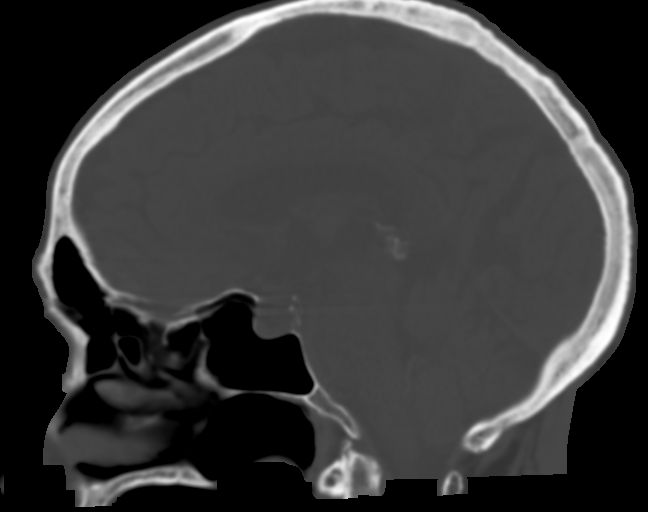
[im 31/47  bone]
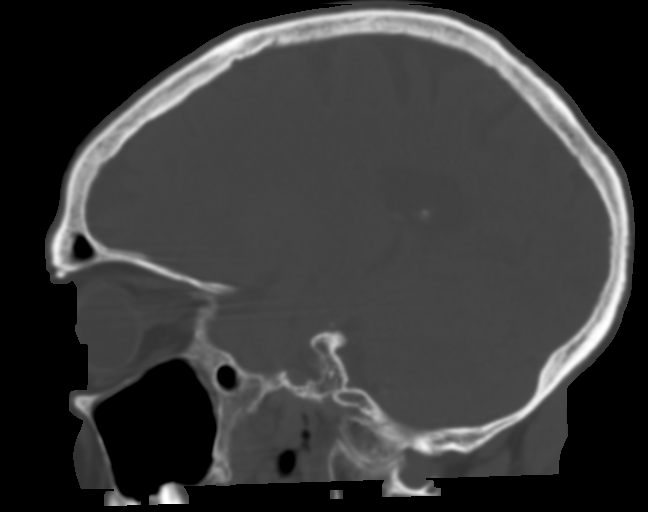
[im 39/47  bone]
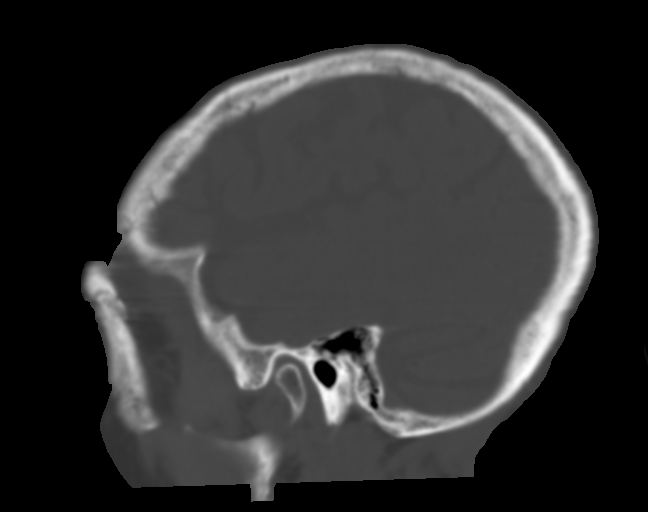

[Series 7: c-spine st · axial · 0.30mm/px · z∈[-240,-184]mm · 2 of 84 slices shown]
[im 28/84  bone]
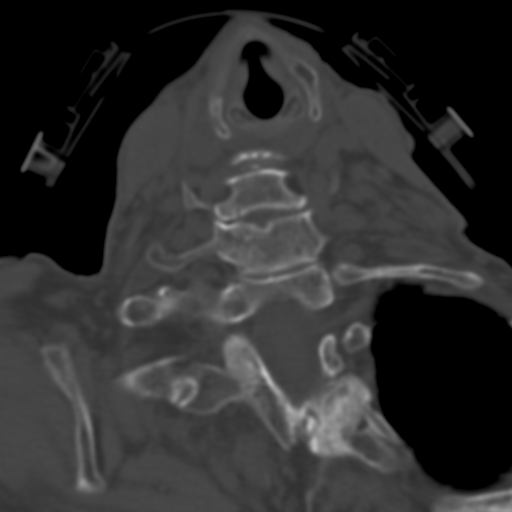
[im 56/84  bone]
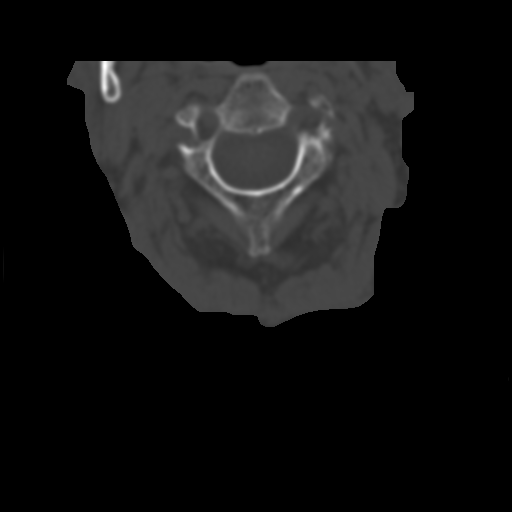

[Series 9: axial recon · axial · 0.23mm/px · z∈[-283,-200]mm · 3 of 99 slices shown, 4 images]
[im 25/99  soft-tissue]
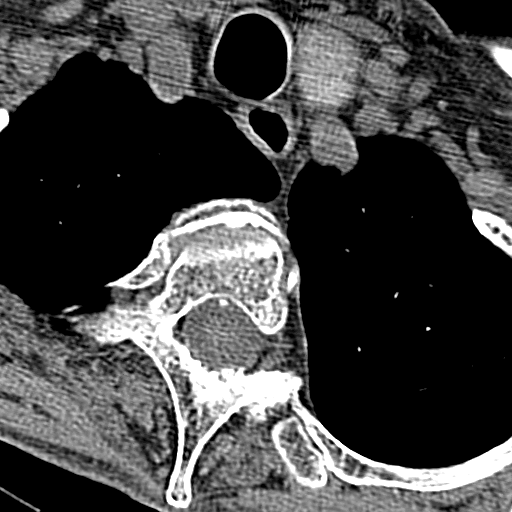
[im 25/99  bone]
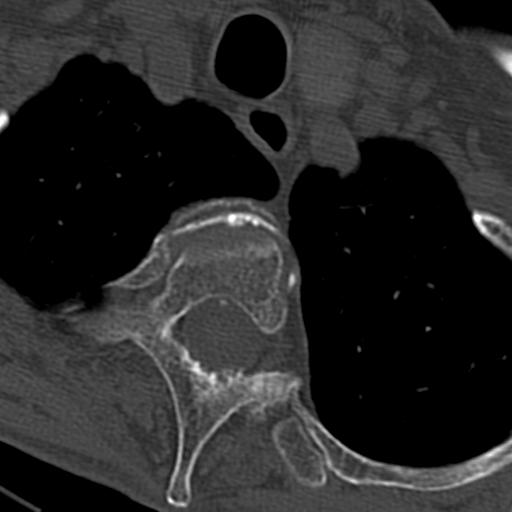
[im 50/99  bone]
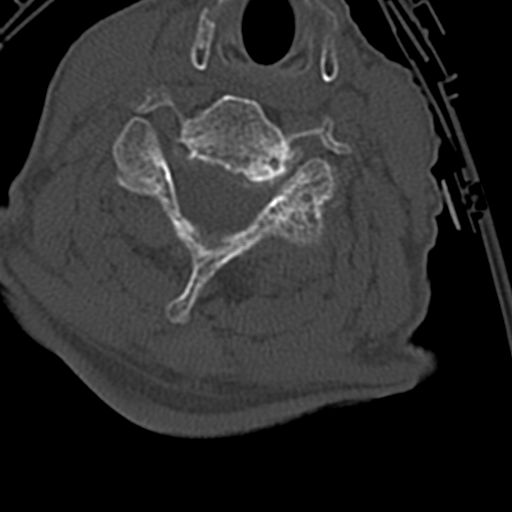
[im 74/99  bone]
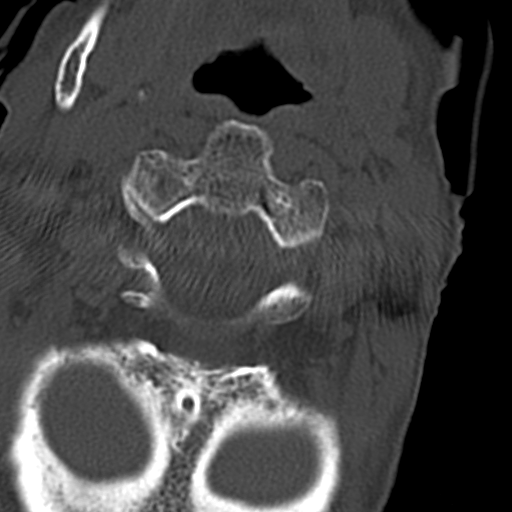

[13 of 33 positions shown; findings below may reference images not displayed]

FINDINGS: CT HEAD FINDINGS

Brain: No evidence of acute infarction, hemorrhage, hydrocephalus,
extra-axial collection or mass lesion/mass effect. Left frontal
lucency appears related to be streak artifact on reconstructions.
Mild brain parenchymal volume loss and chronic microvascular
ischemic changes for age.

Vascular: Mild calcific atherosclerosis of cavernous internal
carotid arteries.

Skull: Calcified nodule of the scalp near the vertex present on
prior CT of head, probably a dermal appendage cyst. No displaced
calvarial fracture.

Sinuses/Orbits: No acute finding. Bilateral intra-ocular lens
replacement.

Other: None.

CT CERVICAL SPINE FINDINGS

Alignment: Exaggerated cervical lordosis.  No significant listhesis.

Skull base and vertebrae: No acute fracture. No primary bone lesion
or focal pathologic process.

Soft tissues and spinal canal: No prevertebral fluid or swelling. No
visible canal hematoma.

Disc levels: Cervical spondylosis with discogenic changes greatest
at the C5 through C7 level and multilevel facet arthrosis. No
high-grade bony canal stenosis. Uncovertebral and facet hypertrophy
results in multiple levels of foraminal narrowing greatest at the
C5-6 level.

Upper chest: Multiple scattered 2-3 mm nodules in the lung apices.

Other: Left thyroid nodules measuring up to 30 mm. Calcific
atherosclerosis of carotid bifurcations.
IMPRESSION: 1. No acute intracranial abnormality identified.
2. No acute fracture or dislocation of the cervical spine.
3. Stable mild parenchymal volume loss and chronic microvascular
ischemic changes of the brain.
4. Cervical spondylosis predominantly from the C5 through C7 levels.
No high-grade bony canal stenosis.
5. Left thyroid nodules measuring up to 30 mm. Further evaluation
with thyroid ultrasound is recommended on a nonemergent basis.
6. Several 2-3 mm pulmonary nodules in the lung apices. No follow-up
needed if patient is low-risk (and has no known or suspected primary
neoplasm). Non-contrast chest CT can be considered in 12 months if
patient is high-risk. This recommendation follows the consensus
statement: Guidelines for Management of Incidental Pulmonary Nodules
Detected on CT Images: From the [HOSPITAL] 0526; Radiology

By: Ourari Tiger M.D.

## 2018-08-01 IMAGING — CR DG HIP (WITH OR WITHOUT PELVIS) 2-3V*R*
3 series · 3 of 3 positions shown · non-contrast
Comparison: 06/29/2014

CLINICAL DATA: Pain after a fall.

EXAM:
DG HIP (WITH OR WITHOUT PELVIS) 2-3V RIGHT

[x pelvis (1 of 2)]
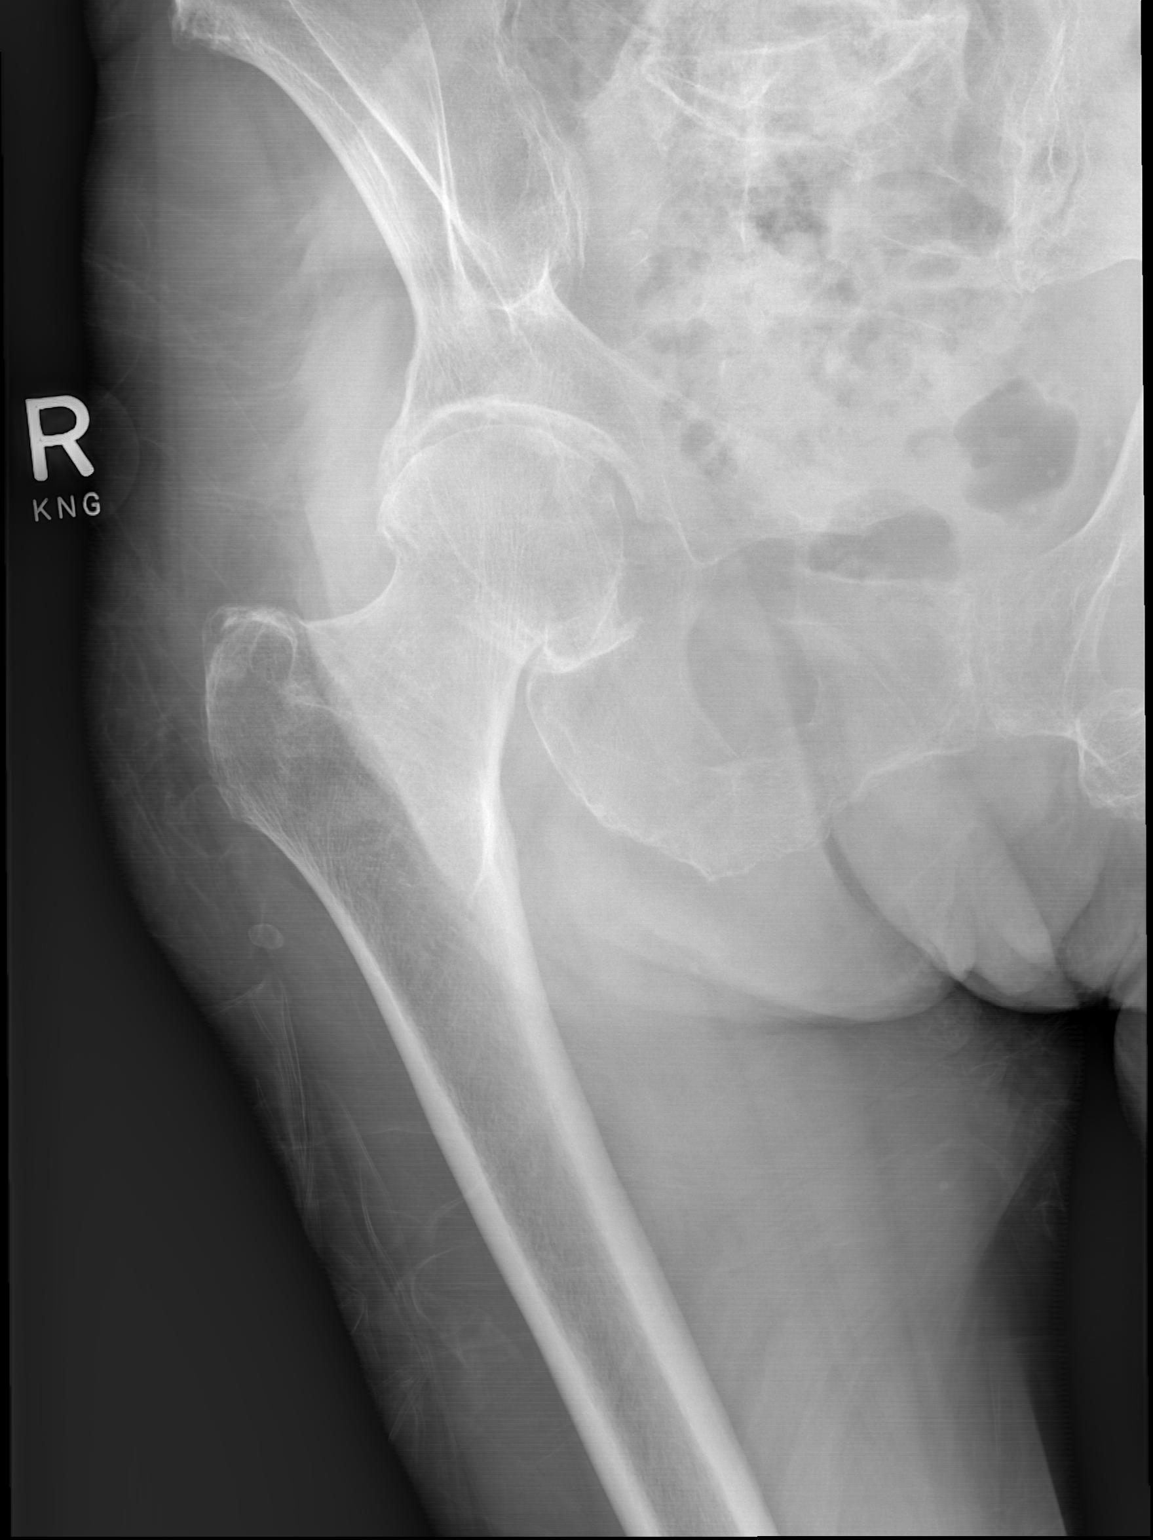

[x pelvis (2 of 2)]
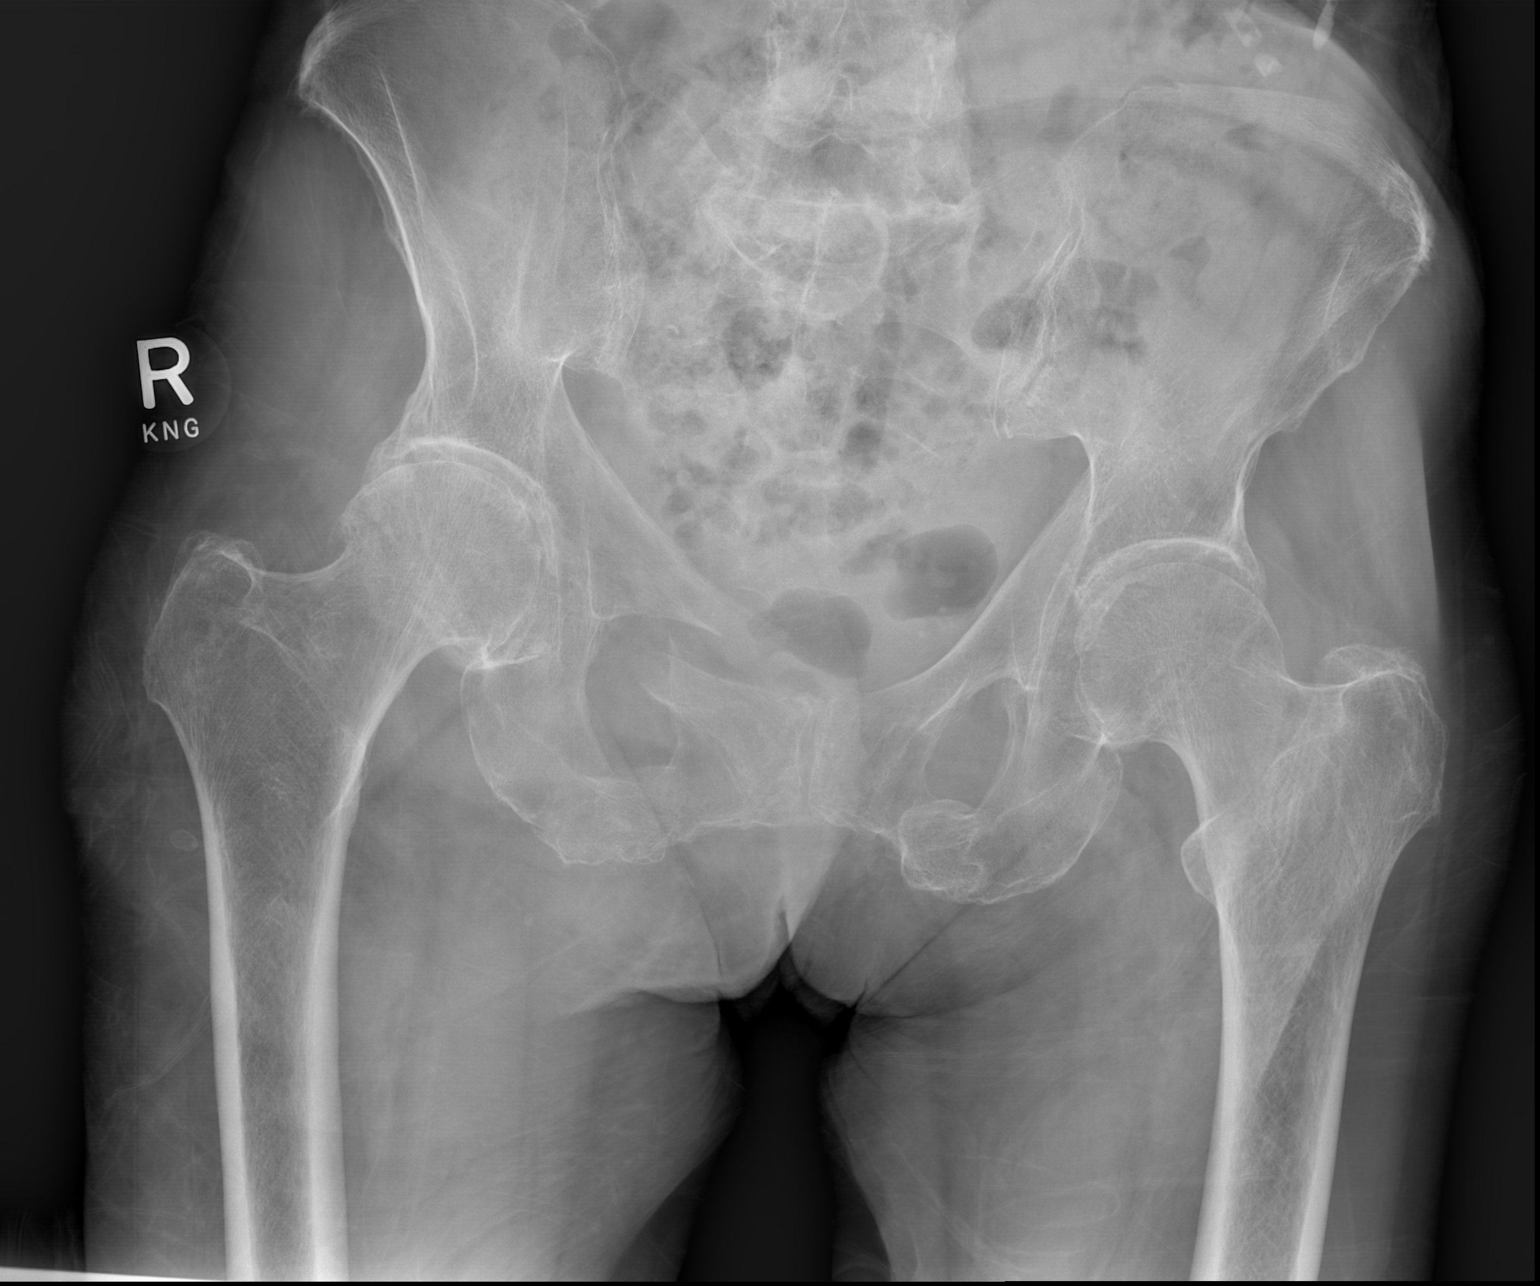

[w hip lat right]
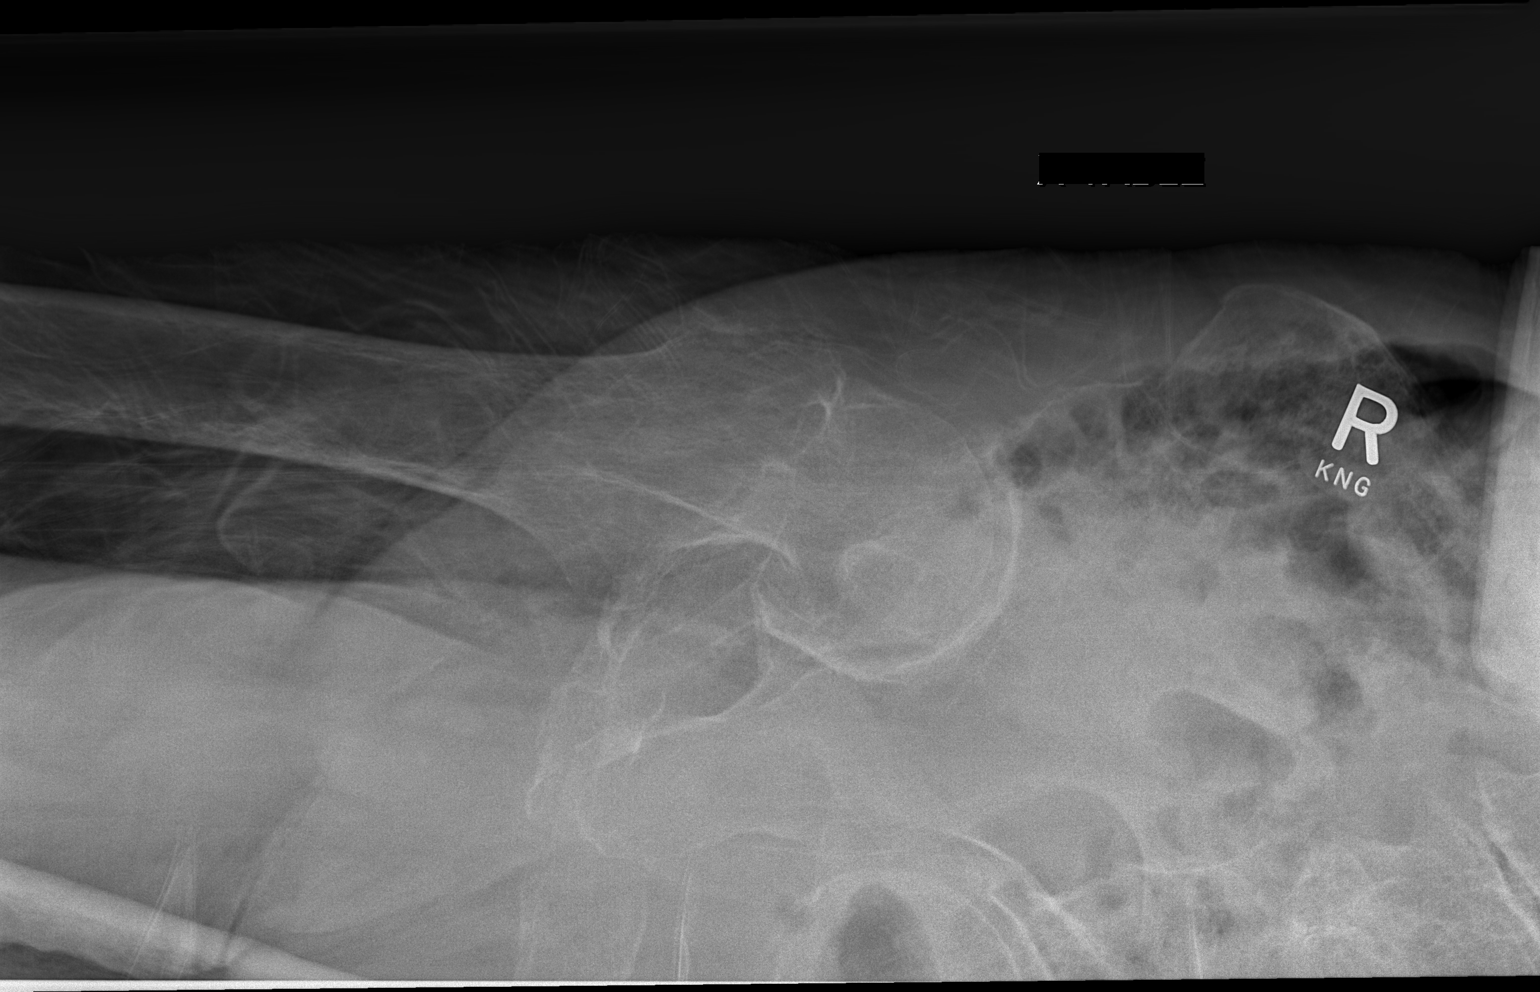

[3 of 3 positions shown; findings below may reference images not displayed]

FINDINGS: Displaced fracture of the right superior and inferior pubic rami,
new since previous study, and probably acute. Old fracture
deformities of the left pubic rami. Degenerative changes in the
hips. SI joints and symphysis pubis are not displaced. No evidence
of fracture or dislocation of the right hip.
IMPRESSION: Acute appearing displaced fractures involving the superior and
inferior right pubic rami. Old fracture deformities of the left
pubic rami. Degenerative changes in the hips.

## 2018-08-03 DIAGNOSIS — R627 Adult failure to thrive: Secondary | ICD-10-CM | POA: Diagnosis not present

## 2018-08-03 DIAGNOSIS — F419 Anxiety disorder, unspecified: Secondary | ICD-10-CM | POA: Diagnosis not present

## 2018-08-03 DIAGNOSIS — F0390 Unspecified dementia without behavioral disturbance: Secondary | ICD-10-CM | POA: Diagnosis not present

## 2018-08-03 DIAGNOSIS — L8990 Pressure ulcer of unspecified site, unspecified stage: Secondary | ICD-10-CM | POA: Diagnosis not present

## 2018-08-03 DIAGNOSIS — E46 Unspecified protein-calorie malnutrition: Secondary | ICD-10-CM | POA: Diagnosis not present

## 2018-11-29 DIAGNOSIS — F411 Generalized anxiety disorder: Secondary | ICD-10-CM | POA: Diagnosis not present

## 2018-11-29 DIAGNOSIS — L89152 Pressure ulcer of sacral region, stage 2: Secondary | ICD-10-CM | POA: Diagnosis not present

## 2018-11-29 DIAGNOSIS — F0390 Unspecified dementia without behavioral disturbance: Secondary | ICD-10-CM | POA: Diagnosis not present

## 2018-11-29 DIAGNOSIS — E46 Unspecified protein-calorie malnutrition: Secondary | ICD-10-CM | POA: Diagnosis not present

## 2018-11-29 DIAGNOSIS — R269 Unspecified abnormalities of gait and mobility: Secondary | ICD-10-CM | POA: Diagnosis not present

## 2018-11-29 DIAGNOSIS — N390 Urinary tract infection, site not specified: Secondary | ICD-10-CM | POA: Diagnosis not present

## 2019-04-24 DIAGNOSIS — F0390 Unspecified dementia without behavioral disturbance: Secondary | ICD-10-CM | POA: Diagnosis not present

## 2019-04-24 DIAGNOSIS — R627 Adult failure to thrive: Secondary | ICD-10-CM | POA: Diagnosis not present

## 2019-04-24 DIAGNOSIS — F419 Anxiety disorder, unspecified: Secondary | ICD-10-CM | POA: Diagnosis not present

## 2019-04-24 DIAGNOSIS — E46 Unspecified protein-calorie malnutrition: Secondary | ICD-10-CM | POA: Diagnosis not present

## 2019-08-31 DIAGNOSIS — L89154 Pressure ulcer of sacral region, stage 4: Secondary | ICD-10-CM | POA: Diagnosis not present

## 2019-08-31 DIAGNOSIS — F0390 Unspecified dementia without behavioral disturbance: Secondary | ICD-10-CM | POA: Diagnosis not present

## 2019-08-31 DIAGNOSIS — F419 Anxiety disorder, unspecified: Secondary | ICD-10-CM | POA: Diagnosis not present

## 2019-08-31 DIAGNOSIS — E46 Unspecified protein-calorie malnutrition: Secondary | ICD-10-CM | POA: Diagnosis not present

## 2019-09-02 DIAGNOSIS — Z4800 Encounter for change or removal of nonsurgical wound dressing: Secondary | ICD-10-CM | POA: Diagnosis not present

## 2019-09-02 DIAGNOSIS — Z8781 Personal history of (healed) traumatic fracture: Secondary | ICD-10-CM | POA: Diagnosis not present

## 2019-09-02 DIAGNOSIS — D696 Thrombocytopenia, unspecified: Secondary | ICD-10-CM | POA: Diagnosis not present

## 2019-09-02 DIAGNOSIS — F419 Anxiety disorder, unspecified: Secondary | ICD-10-CM | POA: Diagnosis not present

## 2019-09-02 DIAGNOSIS — F39 Unspecified mood [affective] disorder: Secondary | ICD-10-CM | POA: Diagnosis not present

## 2019-09-02 DIAGNOSIS — L89322 Pressure ulcer of left buttock, stage 2: Secondary | ICD-10-CM | POA: Diagnosis not present

## 2019-09-02 DIAGNOSIS — S91202D Unspecified open wound of left great toe with damage to nail, subsequent encounter: Secondary | ICD-10-CM | POA: Diagnosis not present

## 2019-09-02 DIAGNOSIS — E43 Unspecified severe protein-calorie malnutrition: Secondary | ICD-10-CM | POA: Diagnosis not present

## 2019-09-02 DIAGNOSIS — F0390 Unspecified dementia without behavioral disturbance: Secondary | ICD-10-CM | POA: Diagnosis not present

## 2019-09-02 DIAGNOSIS — L89154 Pressure ulcer of sacral region, stage 4: Secondary | ICD-10-CM | POA: Diagnosis not present

## 2019-09-02 DIAGNOSIS — Z993 Dependence on wheelchair: Secondary | ICD-10-CM | POA: Diagnosis not present

## 2019-09-02 DIAGNOSIS — R251 Tremor, unspecified: Secondary | ICD-10-CM | POA: Diagnosis not present

## 2019-09-02 DIAGNOSIS — Z9181 History of falling: Secondary | ICD-10-CM | POA: Diagnosis not present

## 2019-09-02 DIAGNOSIS — Z792 Long term (current) use of antibiotics: Secondary | ICD-10-CM | POA: Diagnosis not present

## 2019-09-07 DIAGNOSIS — S91202D Unspecified open wound of left great toe with damage to nail, subsequent encounter: Secondary | ICD-10-CM | POA: Diagnosis not present

## 2019-09-07 DIAGNOSIS — E43 Unspecified severe protein-calorie malnutrition: Secondary | ICD-10-CM | POA: Diagnosis not present

## 2019-09-07 DIAGNOSIS — D696 Thrombocytopenia, unspecified: Secondary | ICD-10-CM | POA: Diagnosis not present

## 2019-09-07 DIAGNOSIS — L89154 Pressure ulcer of sacral region, stage 4: Secondary | ICD-10-CM | POA: Diagnosis not present

## 2019-09-07 DIAGNOSIS — L89322 Pressure ulcer of left buttock, stage 2: Secondary | ICD-10-CM | POA: Diagnosis not present

## 2019-09-07 DIAGNOSIS — R251 Tremor, unspecified: Secondary | ICD-10-CM | POA: Diagnosis not present

## 2019-09-09 DIAGNOSIS — L89322 Pressure ulcer of left buttock, stage 2: Secondary | ICD-10-CM | POA: Diagnosis not present

## 2019-09-09 DIAGNOSIS — L89154 Pressure ulcer of sacral region, stage 4: Secondary | ICD-10-CM | POA: Diagnosis not present

## 2019-09-09 DIAGNOSIS — S91202D Unspecified open wound of left great toe with damage to nail, subsequent encounter: Secondary | ICD-10-CM | POA: Diagnosis not present

## 2019-09-09 DIAGNOSIS — E43 Unspecified severe protein-calorie malnutrition: Secondary | ICD-10-CM | POA: Diagnosis not present

## 2019-09-09 DIAGNOSIS — D696 Thrombocytopenia, unspecified: Secondary | ICD-10-CM | POA: Diagnosis not present

## 2019-09-09 DIAGNOSIS — R251 Tremor, unspecified: Secondary | ICD-10-CM | POA: Diagnosis not present

## 2019-09-15 DIAGNOSIS — E43 Unspecified severe protein-calorie malnutrition: Secondary | ICD-10-CM | POA: Diagnosis not present

## 2019-09-15 DIAGNOSIS — L89154 Pressure ulcer of sacral region, stage 4: Secondary | ICD-10-CM | POA: Diagnosis not present

## 2019-09-15 DIAGNOSIS — R251 Tremor, unspecified: Secondary | ICD-10-CM | POA: Diagnosis not present

## 2019-09-15 DIAGNOSIS — D696 Thrombocytopenia, unspecified: Secondary | ICD-10-CM | POA: Diagnosis not present

## 2019-09-15 DIAGNOSIS — L89322 Pressure ulcer of left buttock, stage 2: Secondary | ICD-10-CM | POA: Diagnosis not present

## 2019-09-15 DIAGNOSIS — S91202D Unspecified open wound of left great toe with damage to nail, subsequent encounter: Secondary | ICD-10-CM | POA: Diagnosis not present

## 2019-09-19 DIAGNOSIS — D696 Thrombocytopenia, unspecified: Secondary | ICD-10-CM | POA: Diagnosis not present

## 2019-09-19 DIAGNOSIS — L89322 Pressure ulcer of left buttock, stage 2: Secondary | ICD-10-CM | POA: Diagnosis not present

## 2019-09-19 DIAGNOSIS — S91202D Unspecified open wound of left great toe with damage to nail, subsequent encounter: Secondary | ICD-10-CM | POA: Diagnosis not present

## 2019-09-19 DIAGNOSIS — R251 Tremor, unspecified: Secondary | ICD-10-CM | POA: Diagnosis not present

## 2019-09-19 DIAGNOSIS — L89154 Pressure ulcer of sacral region, stage 4: Secondary | ICD-10-CM | POA: Diagnosis not present

## 2019-09-19 DIAGNOSIS — E43 Unspecified severe protein-calorie malnutrition: Secondary | ICD-10-CM | POA: Diagnosis not present

## 2019-09-27 DIAGNOSIS — E43 Unspecified severe protein-calorie malnutrition: Secondary | ICD-10-CM | POA: Diagnosis not present

## 2019-09-27 DIAGNOSIS — R251 Tremor, unspecified: Secondary | ICD-10-CM | POA: Diagnosis not present

## 2019-09-27 DIAGNOSIS — L89154 Pressure ulcer of sacral region, stage 4: Secondary | ICD-10-CM | POA: Diagnosis not present

## 2019-09-27 DIAGNOSIS — S91202D Unspecified open wound of left great toe with damage to nail, subsequent encounter: Secondary | ICD-10-CM | POA: Diagnosis not present

## 2019-09-27 DIAGNOSIS — L89322 Pressure ulcer of left buttock, stage 2: Secondary | ICD-10-CM | POA: Diagnosis not present

## 2019-09-27 DIAGNOSIS — D696 Thrombocytopenia, unspecified: Secondary | ICD-10-CM | POA: Diagnosis not present

## 2019-09-28 DIAGNOSIS — E46 Unspecified protein-calorie malnutrition: Secondary | ICD-10-CM | POA: Diagnosis not present

## 2019-09-28 DIAGNOSIS — L89154 Pressure ulcer of sacral region, stage 4: Secondary | ICD-10-CM | POA: Diagnosis not present

## 2019-10-02 DIAGNOSIS — Z9181 History of falling: Secondary | ICD-10-CM | POA: Diagnosis not present

## 2019-10-02 DIAGNOSIS — R251 Tremor, unspecified: Secondary | ICD-10-CM | POA: Diagnosis not present

## 2019-10-02 DIAGNOSIS — Z4800 Encounter for change or removal of nonsurgical wound dressing: Secondary | ICD-10-CM | POA: Diagnosis not present

## 2019-10-02 DIAGNOSIS — F0390 Unspecified dementia without behavioral disturbance: Secondary | ICD-10-CM | POA: Diagnosis not present

## 2019-10-02 DIAGNOSIS — E43 Unspecified severe protein-calorie malnutrition: Secondary | ICD-10-CM | POA: Diagnosis not present

## 2019-10-02 DIAGNOSIS — F419 Anxiety disorder, unspecified: Secondary | ICD-10-CM | POA: Diagnosis not present

## 2019-10-02 DIAGNOSIS — D696 Thrombocytopenia, unspecified: Secondary | ICD-10-CM | POA: Diagnosis not present

## 2019-10-02 DIAGNOSIS — Z792 Long term (current) use of antibiotics: Secondary | ICD-10-CM | POA: Diagnosis not present

## 2019-10-02 DIAGNOSIS — Z993 Dependence on wheelchair: Secondary | ICD-10-CM | POA: Diagnosis not present

## 2019-10-02 DIAGNOSIS — Z8781 Personal history of (healed) traumatic fracture: Secondary | ICD-10-CM | POA: Diagnosis not present

## 2019-10-02 DIAGNOSIS — F39 Unspecified mood [affective] disorder: Secondary | ICD-10-CM | POA: Diagnosis not present

## 2019-10-02 DIAGNOSIS — L89322 Pressure ulcer of left buttock, stage 2: Secondary | ICD-10-CM | POA: Diagnosis not present

## 2019-10-02 DIAGNOSIS — S91202D Unspecified open wound of left great toe with damage to nail, subsequent encounter: Secondary | ICD-10-CM | POA: Diagnosis not present

## 2019-10-02 DIAGNOSIS — L89154 Pressure ulcer of sacral region, stage 4: Secondary | ICD-10-CM | POA: Diagnosis not present

## 2019-10-04 DIAGNOSIS — S91202D Unspecified open wound of left great toe with damage to nail, subsequent encounter: Secondary | ICD-10-CM | POA: Diagnosis not present

## 2019-10-04 DIAGNOSIS — D696 Thrombocytopenia, unspecified: Secondary | ICD-10-CM | POA: Diagnosis not present

## 2019-10-04 DIAGNOSIS — L89322 Pressure ulcer of left buttock, stage 2: Secondary | ICD-10-CM | POA: Diagnosis not present

## 2019-10-04 DIAGNOSIS — L89154 Pressure ulcer of sacral region, stage 4: Secondary | ICD-10-CM | POA: Diagnosis not present

## 2019-10-04 DIAGNOSIS — R251 Tremor, unspecified: Secondary | ICD-10-CM | POA: Diagnosis not present

## 2019-10-04 DIAGNOSIS — E43 Unspecified severe protein-calorie malnutrition: Secondary | ICD-10-CM | POA: Diagnosis not present

## 2019-10-13 DIAGNOSIS — S91202D Unspecified open wound of left great toe with damage to nail, subsequent encounter: Secondary | ICD-10-CM | POA: Diagnosis not present

## 2019-10-13 DIAGNOSIS — E43 Unspecified severe protein-calorie malnutrition: Secondary | ICD-10-CM | POA: Diagnosis not present

## 2019-10-13 DIAGNOSIS — L89322 Pressure ulcer of left buttock, stage 2: Secondary | ICD-10-CM | POA: Diagnosis not present

## 2019-10-13 DIAGNOSIS — D696 Thrombocytopenia, unspecified: Secondary | ICD-10-CM | POA: Diagnosis not present

## 2019-10-13 DIAGNOSIS — L89154 Pressure ulcer of sacral region, stage 4: Secondary | ICD-10-CM | POA: Diagnosis not present

## 2019-10-13 DIAGNOSIS — R251 Tremor, unspecified: Secondary | ICD-10-CM | POA: Diagnosis not present

## 2019-10-19 DIAGNOSIS — L89322 Pressure ulcer of left buttock, stage 2: Secondary | ICD-10-CM | POA: Diagnosis not present

## 2019-10-19 DIAGNOSIS — R251 Tremor, unspecified: Secondary | ICD-10-CM | POA: Diagnosis not present

## 2019-10-19 DIAGNOSIS — D696 Thrombocytopenia, unspecified: Secondary | ICD-10-CM | POA: Diagnosis not present

## 2019-10-19 DIAGNOSIS — E43 Unspecified severe protein-calorie malnutrition: Secondary | ICD-10-CM | POA: Diagnosis not present

## 2019-10-19 DIAGNOSIS — S91202D Unspecified open wound of left great toe with damage to nail, subsequent encounter: Secondary | ICD-10-CM | POA: Diagnosis not present

## 2019-10-19 DIAGNOSIS — L89154 Pressure ulcer of sacral region, stage 4: Secondary | ICD-10-CM | POA: Diagnosis not present

## 2019-10-27 DIAGNOSIS — D696 Thrombocytopenia, unspecified: Secondary | ICD-10-CM | POA: Diagnosis not present

## 2019-10-27 DIAGNOSIS — R251 Tremor, unspecified: Secondary | ICD-10-CM | POA: Diagnosis not present

## 2019-10-27 DIAGNOSIS — S91202D Unspecified open wound of left great toe with damage to nail, subsequent encounter: Secondary | ICD-10-CM | POA: Diagnosis not present

## 2019-10-27 DIAGNOSIS — E43 Unspecified severe protein-calorie malnutrition: Secondary | ICD-10-CM | POA: Diagnosis not present

## 2019-10-27 DIAGNOSIS — L89322 Pressure ulcer of left buttock, stage 2: Secondary | ICD-10-CM | POA: Diagnosis not present

## 2019-10-27 DIAGNOSIS — L89154 Pressure ulcer of sacral region, stage 4: Secondary | ICD-10-CM | POA: Diagnosis not present

## 2019-11-01 DIAGNOSIS — F419 Anxiety disorder, unspecified: Secondary | ICD-10-CM | POA: Diagnosis not present

## 2019-11-01 DIAGNOSIS — L89322 Pressure ulcer of left buttock, stage 2: Secondary | ICD-10-CM | POA: Diagnosis not present

## 2019-11-01 DIAGNOSIS — D696 Thrombocytopenia, unspecified: Secondary | ICD-10-CM | POA: Diagnosis not present

## 2019-11-01 DIAGNOSIS — E43 Unspecified severe protein-calorie malnutrition: Secondary | ICD-10-CM | POA: Diagnosis not present

## 2019-11-01 DIAGNOSIS — F39 Unspecified mood [affective] disorder: Secondary | ICD-10-CM | POA: Diagnosis not present

## 2019-11-01 DIAGNOSIS — L89154 Pressure ulcer of sacral region, stage 4: Secondary | ICD-10-CM | POA: Diagnosis not present

## 2019-11-01 DIAGNOSIS — F0390 Unspecified dementia without behavioral disturbance: Secondary | ICD-10-CM | POA: Diagnosis not present

## 2019-11-01 DIAGNOSIS — Z4800 Encounter for change or removal of nonsurgical wound dressing: Secondary | ICD-10-CM | POA: Diagnosis not present

## 2019-11-01 DIAGNOSIS — Z993 Dependence on wheelchair: Secondary | ICD-10-CM | POA: Diagnosis not present

## 2019-11-01 DIAGNOSIS — S91202D Unspecified open wound of left great toe with damage to nail, subsequent encounter: Secondary | ICD-10-CM | POA: Diagnosis not present

## 2019-11-01 DIAGNOSIS — R251 Tremor, unspecified: Secondary | ICD-10-CM | POA: Diagnosis not present

## 2019-11-01 DIAGNOSIS — Z9181 History of falling: Secondary | ICD-10-CM | POA: Diagnosis not present

## 2019-11-01 DIAGNOSIS — Z792 Long term (current) use of antibiotics: Secondary | ICD-10-CM | POA: Diagnosis not present

## 2019-11-01 DIAGNOSIS — Z8781 Personal history of (healed) traumatic fracture: Secondary | ICD-10-CM | POA: Diagnosis not present

## 2019-11-04 DIAGNOSIS — D696 Thrombocytopenia, unspecified: Secondary | ICD-10-CM | POA: Diagnosis not present

## 2019-11-04 DIAGNOSIS — E43 Unspecified severe protein-calorie malnutrition: Secondary | ICD-10-CM | POA: Diagnosis not present

## 2019-11-04 DIAGNOSIS — L89154 Pressure ulcer of sacral region, stage 4: Secondary | ICD-10-CM | POA: Diagnosis not present

## 2019-11-04 DIAGNOSIS — R251 Tremor, unspecified: Secondary | ICD-10-CM | POA: Diagnosis not present

## 2019-11-04 DIAGNOSIS — L89322 Pressure ulcer of left buttock, stage 2: Secondary | ICD-10-CM | POA: Diagnosis not present

## 2019-11-04 DIAGNOSIS — F0390 Unspecified dementia without behavioral disturbance: Secondary | ICD-10-CM | POA: Diagnosis not present

## 2019-11-10 DIAGNOSIS — L89154 Pressure ulcer of sacral region, stage 4: Secondary | ICD-10-CM | POA: Diagnosis not present

## 2019-11-10 DIAGNOSIS — F0390 Unspecified dementia without behavioral disturbance: Secondary | ICD-10-CM | POA: Diagnosis not present

## 2019-11-10 DIAGNOSIS — E43 Unspecified severe protein-calorie malnutrition: Secondary | ICD-10-CM | POA: Diagnosis not present

## 2019-11-10 DIAGNOSIS — D696 Thrombocytopenia, unspecified: Secondary | ICD-10-CM | POA: Diagnosis not present

## 2019-11-10 DIAGNOSIS — L89322 Pressure ulcer of left buttock, stage 2: Secondary | ICD-10-CM | POA: Diagnosis not present

## 2019-11-10 DIAGNOSIS — R251 Tremor, unspecified: Secondary | ICD-10-CM | POA: Diagnosis not present

## 2019-11-16 DIAGNOSIS — R251 Tremor, unspecified: Secondary | ICD-10-CM | POA: Diagnosis not present

## 2019-11-16 DIAGNOSIS — F0390 Unspecified dementia without behavioral disturbance: Secondary | ICD-10-CM | POA: Diagnosis not present

## 2019-11-16 DIAGNOSIS — L89322 Pressure ulcer of left buttock, stage 2: Secondary | ICD-10-CM | POA: Diagnosis not present

## 2019-11-16 DIAGNOSIS — E43 Unspecified severe protein-calorie malnutrition: Secondary | ICD-10-CM | POA: Diagnosis not present

## 2019-11-16 DIAGNOSIS — L89154 Pressure ulcer of sacral region, stage 4: Secondary | ICD-10-CM | POA: Diagnosis not present

## 2019-11-16 DIAGNOSIS — D696 Thrombocytopenia, unspecified: Secondary | ICD-10-CM | POA: Diagnosis not present

## 2019-11-22 DIAGNOSIS — L89322 Pressure ulcer of left buttock, stage 2: Secondary | ICD-10-CM | POA: Diagnosis not present

## 2019-11-22 DIAGNOSIS — D696 Thrombocytopenia, unspecified: Secondary | ICD-10-CM | POA: Diagnosis not present

## 2019-11-22 DIAGNOSIS — R251 Tremor, unspecified: Secondary | ICD-10-CM | POA: Diagnosis not present

## 2019-11-22 DIAGNOSIS — L89154 Pressure ulcer of sacral region, stage 4: Secondary | ICD-10-CM | POA: Diagnosis not present

## 2019-11-22 DIAGNOSIS — E43 Unspecified severe protein-calorie malnutrition: Secondary | ICD-10-CM | POA: Diagnosis not present

## 2019-11-22 DIAGNOSIS — F0390 Unspecified dementia without behavioral disturbance: Secondary | ICD-10-CM | POA: Diagnosis not present

## 2019-12-01 DIAGNOSIS — R251 Tremor, unspecified: Secondary | ICD-10-CM | POA: Diagnosis not present

## 2019-12-01 DIAGNOSIS — Z792 Long term (current) use of antibiotics: Secondary | ICD-10-CM | POA: Diagnosis not present

## 2019-12-01 DIAGNOSIS — L89322 Pressure ulcer of left buttock, stage 2: Secondary | ICD-10-CM | POA: Diagnosis not present

## 2019-12-01 DIAGNOSIS — F419 Anxiety disorder, unspecified: Secondary | ICD-10-CM | POA: Diagnosis not present

## 2019-12-01 DIAGNOSIS — F0390 Unspecified dementia without behavioral disturbance: Secondary | ICD-10-CM | POA: Diagnosis not present

## 2019-12-01 DIAGNOSIS — F39 Unspecified mood [affective] disorder: Secondary | ICD-10-CM | POA: Diagnosis not present

## 2019-12-01 DIAGNOSIS — D696 Thrombocytopenia, unspecified: Secondary | ICD-10-CM | POA: Diagnosis not present

## 2019-12-01 DIAGNOSIS — Z993 Dependence on wheelchair: Secondary | ICD-10-CM | POA: Diagnosis not present

## 2019-12-01 DIAGNOSIS — Z9181 History of falling: Secondary | ICD-10-CM | POA: Diagnosis not present

## 2019-12-01 DIAGNOSIS — L89154 Pressure ulcer of sacral region, stage 4: Secondary | ICD-10-CM | POA: Diagnosis not present

## 2019-12-01 DIAGNOSIS — Z8781 Personal history of (healed) traumatic fracture: Secondary | ICD-10-CM | POA: Diagnosis not present

## 2019-12-01 DIAGNOSIS — Z4800 Encounter for change or removal of nonsurgical wound dressing: Secondary | ICD-10-CM | POA: Diagnosis not present

## 2019-12-01 DIAGNOSIS — E43 Unspecified severe protein-calorie malnutrition: Secondary | ICD-10-CM | POA: Diagnosis not present

## 2019-12-06 DIAGNOSIS — E43 Unspecified severe protein-calorie malnutrition: Secondary | ICD-10-CM | POA: Diagnosis not present

## 2019-12-06 DIAGNOSIS — R251 Tremor, unspecified: Secondary | ICD-10-CM | POA: Diagnosis not present

## 2019-12-06 DIAGNOSIS — L89322 Pressure ulcer of left buttock, stage 2: Secondary | ICD-10-CM | POA: Diagnosis not present

## 2019-12-06 DIAGNOSIS — F0390 Unspecified dementia without behavioral disturbance: Secondary | ICD-10-CM | POA: Diagnosis not present

## 2019-12-06 DIAGNOSIS — D696 Thrombocytopenia, unspecified: Secondary | ICD-10-CM | POA: Diagnosis not present

## 2019-12-06 DIAGNOSIS — L89154 Pressure ulcer of sacral region, stage 4: Secondary | ICD-10-CM | POA: Diagnosis not present

## 2019-12-14 DIAGNOSIS — E43 Unspecified severe protein-calorie malnutrition: Secondary | ICD-10-CM | POA: Diagnosis not present

## 2019-12-14 DIAGNOSIS — F0281 Dementia in other diseases classified elsewhere with behavioral disturbance: Secondary | ICD-10-CM | POA: Diagnosis not present

## 2019-12-14 DIAGNOSIS — Z8781 Personal history of (healed) traumatic fracture: Secondary | ICD-10-CM | POA: Diagnosis not present

## 2019-12-14 DIAGNOSIS — Z681 Body mass index (BMI) 19 or less, adult: Secondary | ICD-10-CM | POA: Diagnosis not present

## 2019-12-14 DIAGNOSIS — R1310 Dysphagia, unspecified: Secondary | ICD-10-CM | POA: Diagnosis not present

## 2019-12-14 DIAGNOSIS — L89314 Pressure ulcer of right buttock, stage 4: Secondary | ICD-10-CM | POA: Diagnosis not present

## 2019-12-14 DIAGNOSIS — M2450 Contracture, unspecified joint: Secondary | ICD-10-CM | POA: Diagnosis not present

## 2019-12-14 DIAGNOSIS — G309 Alzheimer's disease, unspecified: Secondary | ICD-10-CM | POA: Diagnosis not present

## 2019-12-15 DIAGNOSIS — F0281 Dementia in other diseases classified elsewhere with behavioral disturbance: Secondary | ICD-10-CM | POA: Diagnosis not present

## 2019-12-15 DIAGNOSIS — E43 Unspecified severe protein-calorie malnutrition: Secondary | ICD-10-CM | POA: Diagnosis not present

## 2019-12-15 DIAGNOSIS — G309 Alzheimer's disease, unspecified: Secondary | ICD-10-CM | POA: Diagnosis not present

## 2019-12-15 DIAGNOSIS — L89314 Pressure ulcer of right buttock, stage 4: Secondary | ICD-10-CM | POA: Diagnosis not present

## 2019-12-15 DIAGNOSIS — M2450 Contracture, unspecified joint: Secondary | ICD-10-CM | POA: Diagnosis not present

## 2019-12-15 DIAGNOSIS — R1310 Dysphagia, unspecified: Secondary | ICD-10-CM | POA: Diagnosis not present

## 2019-12-18 DIAGNOSIS — F0281 Dementia in other diseases classified elsewhere with behavioral disturbance: Secondary | ICD-10-CM | POA: Diagnosis not present

## 2019-12-18 DIAGNOSIS — M2450 Contracture, unspecified joint: Secondary | ICD-10-CM | POA: Diagnosis not present

## 2019-12-18 DIAGNOSIS — L89314 Pressure ulcer of right buttock, stage 4: Secondary | ICD-10-CM | POA: Diagnosis not present

## 2019-12-18 DIAGNOSIS — R1310 Dysphagia, unspecified: Secondary | ICD-10-CM | POA: Diagnosis not present

## 2019-12-18 DIAGNOSIS — E43 Unspecified severe protein-calorie malnutrition: Secondary | ICD-10-CM | POA: Diagnosis not present

## 2019-12-18 DIAGNOSIS — G309 Alzheimer's disease, unspecified: Secondary | ICD-10-CM | POA: Diagnosis not present

## 2019-12-19 DIAGNOSIS — M2450 Contracture, unspecified joint: Secondary | ICD-10-CM | POA: Diagnosis not present

## 2019-12-19 DIAGNOSIS — L89314 Pressure ulcer of right buttock, stage 4: Secondary | ICD-10-CM | POA: Diagnosis not present

## 2019-12-19 DIAGNOSIS — F0281 Dementia in other diseases classified elsewhere with behavioral disturbance: Secondary | ICD-10-CM | POA: Diagnosis not present

## 2019-12-19 DIAGNOSIS — G309 Alzheimer's disease, unspecified: Secondary | ICD-10-CM | POA: Diagnosis not present

## 2019-12-19 DIAGNOSIS — R1310 Dysphagia, unspecified: Secondary | ICD-10-CM | POA: Diagnosis not present

## 2019-12-19 DIAGNOSIS — E43 Unspecified severe protein-calorie malnutrition: Secondary | ICD-10-CM | POA: Diagnosis not present

## 2019-12-20 DIAGNOSIS — F0281 Dementia in other diseases classified elsewhere with behavioral disturbance: Secondary | ICD-10-CM | POA: Diagnosis not present

## 2019-12-20 DIAGNOSIS — M2450 Contracture, unspecified joint: Secondary | ICD-10-CM | POA: Diagnosis not present

## 2019-12-20 DIAGNOSIS — R1310 Dysphagia, unspecified: Secondary | ICD-10-CM | POA: Diagnosis not present

## 2019-12-20 DIAGNOSIS — L89314 Pressure ulcer of right buttock, stage 4: Secondary | ICD-10-CM | POA: Diagnosis not present

## 2019-12-20 DIAGNOSIS — E43 Unspecified severe protein-calorie malnutrition: Secondary | ICD-10-CM | POA: Diagnosis not present

## 2019-12-20 DIAGNOSIS — G309 Alzheimer's disease, unspecified: Secondary | ICD-10-CM | POA: Diagnosis not present

## 2019-12-26 DIAGNOSIS — E43 Unspecified severe protein-calorie malnutrition: Secondary | ICD-10-CM | POA: Diagnosis not present

## 2019-12-26 DIAGNOSIS — L89314 Pressure ulcer of right buttock, stage 4: Secondary | ICD-10-CM | POA: Diagnosis not present

## 2019-12-26 DIAGNOSIS — R1310 Dysphagia, unspecified: Secondary | ICD-10-CM | POA: Diagnosis not present

## 2019-12-26 DIAGNOSIS — F0281 Dementia in other diseases classified elsewhere with behavioral disturbance: Secondary | ICD-10-CM | POA: Diagnosis not present

## 2019-12-26 DIAGNOSIS — M2450 Contracture, unspecified joint: Secondary | ICD-10-CM | POA: Diagnosis not present

## 2019-12-26 DIAGNOSIS — G309 Alzheimer's disease, unspecified: Secondary | ICD-10-CM | POA: Diagnosis not present

## 2020-01-04 DIAGNOSIS — E43 Unspecified severe protein-calorie malnutrition: Secondary | ICD-10-CM | POA: Diagnosis not present

## 2020-01-04 DIAGNOSIS — L89314 Pressure ulcer of right buttock, stage 4: Secondary | ICD-10-CM | POA: Diagnosis not present

## 2020-01-04 DIAGNOSIS — R1310 Dysphagia, unspecified: Secondary | ICD-10-CM | POA: Diagnosis not present

## 2020-01-04 DIAGNOSIS — F0281 Dementia in other diseases classified elsewhere with behavioral disturbance: Secondary | ICD-10-CM | POA: Diagnosis not present

## 2020-01-04 DIAGNOSIS — G309 Alzheimer's disease, unspecified: Secondary | ICD-10-CM | POA: Diagnosis not present

## 2020-01-04 DIAGNOSIS — M2450 Contracture, unspecified joint: Secondary | ICD-10-CM | POA: Diagnosis not present

## 2020-01-06 DIAGNOSIS — Z681 Body mass index (BMI) 19 or less, adult: Secondary | ICD-10-CM | POA: Diagnosis not present

## 2020-01-06 DIAGNOSIS — Z8781 Personal history of (healed) traumatic fracture: Secondary | ICD-10-CM | POA: Diagnosis not present

## 2020-01-06 DIAGNOSIS — M2450 Contracture, unspecified joint: Secondary | ICD-10-CM | POA: Diagnosis not present

## 2020-01-06 DIAGNOSIS — F0281 Dementia in other diseases classified elsewhere with behavioral disturbance: Secondary | ICD-10-CM | POA: Diagnosis not present

## 2020-01-06 DIAGNOSIS — L89314 Pressure ulcer of right buttock, stage 4: Secondary | ICD-10-CM | POA: Diagnosis not present

## 2020-01-06 DIAGNOSIS — G309 Alzheimer's disease, unspecified: Secondary | ICD-10-CM | POA: Diagnosis not present

## 2020-01-06 DIAGNOSIS — R1310 Dysphagia, unspecified: Secondary | ICD-10-CM | POA: Diagnosis not present

## 2020-01-06 DIAGNOSIS — E43 Unspecified severe protein-calorie malnutrition: Secondary | ICD-10-CM | POA: Diagnosis not present

## 2020-01-09 DIAGNOSIS — E43 Unspecified severe protein-calorie malnutrition: Secondary | ICD-10-CM | POA: Diagnosis not present

## 2020-01-09 DIAGNOSIS — L89314 Pressure ulcer of right buttock, stage 4: Secondary | ICD-10-CM | POA: Diagnosis not present

## 2020-01-09 DIAGNOSIS — G309 Alzheimer's disease, unspecified: Secondary | ICD-10-CM | POA: Diagnosis not present

## 2020-01-09 DIAGNOSIS — M2450 Contracture, unspecified joint: Secondary | ICD-10-CM | POA: Diagnosis not present

## 2020-01-09 DIAGNOSIS — R1310 Dysphagia, unspecified: Secondary | ICD-10-CM | POA: Diagnosis not present

## 2020-01-09 DIAGNOSIS — F0281 Dementia in other diseases classified elsewhere with behavioral disturbance: Secondary | ICD-10-CM | POA: Diagnosis not present

## 2020-01-15 DIAGNOSIS — L89314 Pressure ulcer of right buttock, stage 4: Secondary | ICD-10-CM | POA: Diagnosis not present

## 2020-01-15 DIAGNOSIS — E43 Unspecified severe protein-calorie malnutrition: Secondary | ICD-10-CM | POA: Diagnosis not present

## 2020-01-15 DIAGNOSIS — G309 Alzheimer's disease, unspecified: Secondary | ICD-10-CM | POA: Diagnosis not present

## 2020-01-15 DIAGNOSIS — R1310 Dysphagia, unspecified: Secondary | ICD-10-CM | POA: Diagnosis not present

## 2020-01-15 DIAGNOSIS — M2450 Contracture, unspecified joint: Secondary | ICD-10-CM | POA: Diagnosis not present

## 2020-01-15 DIAGNOSIS — F0281 Dementia in other diseases classified elsewhere with behavioral disturbance: Secondary | ICD-10-CM | POA: Diagnosis not present

## 2020-01-16 DIAGNOSIS — E43 Unspecified severe protein-calorie malnutrition: Secondary | ICD-10-CM | POA: Diagnosis not present

## 2020-01-16 DIAGNOSIS — L89314 Pressure ulcer of right buttock, stage 4: Secondary | ICD-10-CM | POA: Diagnosis not present

## 2020-01-16 DIAGNOSIS — G309 Alzheimer's disease, unspecified: Secondary | ICD-10-CM | POA: Diagnosis not present

## 2020-01-16 DIAGNOSIS — M2450 Contracture, unspecified joint: Secondary | ICD-10-CM | POA: Diagnosis not present

## 2020-01-16 DIAGNOSIS — F0281 Dementia in other diseases classified elsewhere with behavioral disturbance: Secondary | ICD-10-CM | POA: Diagnosis not present

## 2020-01-16 DIAGNOSIS — R1310 Dysphagia, unspecified: Secondary | ICD-10-CM | POA: Diagnosis not present

## 2020-01-24 DIAGNOSIS — E43 Unspecified severe protein-calorie malnutrition: Secondary | ICD-10-CM | POA: Diagnosis not present

## 2020-01-24 DIAGNOSIS — G309 Alzheimer's disease, unspecified: Secondary | ICD-10-CM | POA: Diagnosis not present

## 2020-01-24 DIAGNOSIS — M2450 Contracture, unspecified joint: Secondary | ICD-10-CM | POA: Diagnosis not present

## 2020-01-24 DIAGNOSIS — F0281 Dementia in other diseases classified elsewhere with behavioral disturbance: Secondary | ICD-10-CM | POA: Diagnosis not present

## 2020-01-24 DIAGNOSIS — L89314 Pressure ulcer of right buttock, stage 4: Secondary | ICD-10-CM | POA: Diagnosis not present

## 2020-01-24 DIAGNOSIS — R1310 Dysphagia, unspecified: Secondary | ICD-10-CM | POA: Diagnosis not present

## 2020-01-30 DIAGNOSIS — M2450 Contracture, unspecified joint: Secondary | ICD-10-CM | POA: Diagnosis not present

## 2020-01-30 DIAGNOSIS — G309 Alzheimer's disease, unspecified: Secondary | ICD-10-CM | POA: Diagnosis not present

## 2020-01-30 DIAGNOSIS — F0281 Dementia in other diseases classified elsewhere with behavioral disturbance: Secondary | ICD-10-CM | POA: Diagnosis not present

## 2020-01-30 DIAGNOSIS — E43 Unspecified severe protein-calorie malnutrition: Secondary | ICD-10-CM | POA: Diagnosis not present

## 2020-01-30 DIAGNOSIS — L89314 Pressure ulcer of right buttock, stage 4: Secondary | ICD-10-CM | POA: Diagnosis not present

## 2020-01-30 DIAGNOSIS — R1310 Dysphagia, unspecified: Secondary | ICD-10-CM | POA: Diagnosis not present

## 2020-01-31 DIAGNOSIS — E43 Unspecified severe protein-calorie malnutrition: Secondary | ICD-10-CM | POA: Diagnosis not present

## 2020-01-31 DIAGNOSIS — G309 Alzheimer's disease, unspecified: Secondary | ICD-10-CM | POA: Diagnosis not present

## 2020-01-31 DIAGNOSIS — M2450 Contracture, unspecified joint: Secondary | ICD-10-CM | POA: Diagnosis not present

## 2020-01-31 DIAGNOSIS — R1310 Dysphagia, unspecified: Secondary | ICD-10-CM | POA: Diagnosis not present

## 2020-01-31 DIAGNOSIS — F0281 Dementia in other diseases classified elsewhere with behavioral disturbance: Secondary | ICD-10-CM | POA: Diagnosis not present

## 2020-01-31 DIAGNOSIS — L89314 Pressure ulcer of right buttock, stage 4: Secondary | ICD-10-CM | POA: Diagnosis not present

## 2020-02-06 DIAGNOSIS — R1310 Dysphagia, unspecified: Secondary | ICD-10-CM | POA: Diagnosis not present

## 2020-02-06 DIAGNOSIS — Z8781 Personal history of (healed) traumatic fracture: Secondary | ICD-10-CM | POA: Diagnosis not present

## 2020-02-06 DIAGNOSIS — F0281 Dementia in other diseases classified elsewhere with behavioral disturbance: Secondary | ICD-10-CM | POA: Diagnosis not present

## 2020-02-06 DIAGNOSIS — G309 Alzheimer's disease, unspecified: Secondary | ICD-10-CM | POA: Diagnosis not present

## 2020-02-06 DIAGNOSIS — L89314 Pressure ulcer of right buttock, stage 4: Secondary | ICD-10-CM | POA: Diagnosis not present

## 2020-02-06 DIAGNOSIS — E43 Unspecified severe protein-calorie malnutrition: Secondary | ICD-10-CM | POA: Diagnosis not present

## 2020-02-06 DIAGNOSIS — Z681 Body mass index (BMI) 19 or less, adult: Secondary | ICD-10-CM | POA: Diagnosis not present

## 2020-02-06 DIAGNOSIS — M2450 Contracture, unspecified joint: Secondary | ICD-10-CM | POA: Diagnosis not present

## 2020-02-12 DIAGNOSIS — L89314 Pressure ulcer of right buttock, stage 4: Secondary | ICD-10-CM | POA: Diagnosis not present

## 2020-02-12 DIAGNOSIS — E43 Unspecified severe protein-calorie malnutrition: Secondary | ICD-10-CM | POA: Diagnosis not present

## 2020-02-12 DIAGNOSIS — M2450 Contracture, unspecified joint: Secondary | ICD-10-CM | POA: Diagnosis not present

## 2020-02-12 DIAGNOSIS — F0281 Dementia in other diseases classified elsewhere with behavioral disturbance: Secondary | ICD-10-CM | POA: Diagnosis not present

## 2020-02-12 DIAGNOSIS — G309 Alzheimer's disease, unspecified: Secondary | ICD-10-CM | POA: Diagnosis not present

## 2020-02-12 DIAGNOSIS — R1310 Dysphagia, unspecified: Secondary | ICD-10-CM | POA: Diagnosis not present

## 2020-02-13 DIAGNOSIS — R1310 Dysphagia, unspecified: Secondary | ICD-10-CM | POA: Diagnosis not present

## 2020-02-13 DIAGNOSIS — M2450 Contracture, unspecified joint: Secondary | ICD-10-CM | POA: Diagnosis not present

## 2020-02-13 DIAGNOSIS — F0281 Dementia in other diseases classified elsewhere with behavioral disturbance: Secondary | ICD-10-CM | POA: Diagnosis not present

## 2020-02-13 DIAGNOSIS — E43 Unspecified severe protein-calorie malnutrition: Secondary | ICD-10-CM | POA: Diagnosis not present

## 2020-02-13 DIAGNOSIS — G309 Alzheimer's disease, unspecified: Secondary | ICD-10-CM | POA: Diagnosis not present

## 2020-02-13 DIAGNOSIS — L89314 Pressure ulcer of right buttock, stage 4: Secondary | ICD-10-CM | POA: Diagnosis not present

## 2020-02-20 DIAGNOSIS — F0281 Dementia in other diseases classified elsewhere with behavioral disturbance: Secondary | ICD-10-CM | POA: Diagnosis not present

## 2020-02-20 DIAGNOSIS — M2450 Contracture, unspecified joint: Secondary | ICD-10-CM | POA: Diagnosis not present

## 2020-02-20 DIAGNOSIS — L89314 Pressure ulcer of right buttock, stage 4: Secondary | ICD-10-CM | POA: Diagnosis not present

## 2020-02-20 DIAGNOSIS — G309 Alzheimer's disease, unspecified: Secondary | ICD-10-CM | POA: Diagnosis not present

## 2020-02-20 DIAGNOSIS — E43 Unspecified severe protein-calorie malnutrition: Secondary | ICD-10-CM | POA: Diagnosis not present

## 2020-02-20 DIAGNOSIS — R1310 Dysphagia, unspecified: Secondary | ICD-10-CM | POA: Diagnosis not present

## 2020-02-27 DIAGNOSIS — L89314 Pressure ulcer of right buttock, stage 4: Secondary | ICD-10-CM | POA: Diagnosis not present

## 2020-02-27 DIAGNOSIS — G309 Alzheimer's disease, unspecified: Secondary | ICD-10-CM | POA: Diagnosis not present

## 2020-02-27 DIAGNOSIS — M2450 Contracture, unspecified joint: Secondary | ICD-10-CM | POA: Diagnosis not present

## 2020-02-27 DIAGNOSIS — E43 Unspecified severe protein-calorie malnutrition: Secondary | ICD-10-CM | POA: Diagnosis not present

## 2020-02-27 DIAGNOSIS — R1310 Dysphagia, unspecified: Secondary | ICD-10-CM | POA: Diagnosis not present

## 2020-02-27 DIAGNOSIS — F0281 Dementia in other diseases classified elsewhere with behavioral disturbance: Secondary | ICD-10-CM | POA: Diagnosis not present

## 2020-03-05 DIAGNOSIS — G309 Alzheimer's disease, unspecified: Secondary | ICD-10-CM | POA: Diagnosis not present

## 2020-03-05 DIAGNOSIS — Z681 Body mass index (BMI) 19 or less, adult: Secondary | ICD-10-CM | POA: Diagnosis not present

## 2020-03-05 DIAGNOSIS — L89314 Pressure ulcer of right buttock, stage 4: Secondary | ICD-10-CM | POA: Diagnosis not present

## 2020-03-05 DIAGNOSIS — F0281 Dementia in other diseases classified elsewhere with behavioral disturbance: Secondary | ICD-10-CM | POA: Diagnosis not present

## 2020-03-05 DIAGNOSIS — E43 Unspecified severe protein-calorie malnutrition: Secondary | ICD-10-CM | POA: Diagnosis not present

## 2020-03-05 DIAGNOSIS — Z8781 Personal history of (healed) traumatic fracture: Secondary | ICD-10-CM | POA: Diagnosis not present

## 2020-03-05 DIAGNOSIS — M2450 Contracture, unspecified joint: Secondary | ICD-10-CM | POA: Diagnosis not present

## 2020-03-05 DIAGNOSIS — R1310 Dysphagia, unspecified: Secondary | ICD-10-CM | POA: Diagnosis not present

## 2020-03-12 DIAGNOSIS — M2450 Contracture, unspecified joint: Secondary | ICD-10-CM | POA: Diagnosis not present

## 2020-03-12 DIAGNOSIS — R1310 Dysphagia, unspecified: Secondary | ICD-10-CM | POA: Diagnosis not present

## 2020-03-12 DIAGNOSIS — F0281 Dementia in other diseases classified elsewhere with behavioral disturbance: Secondary | ICD-10-CM | POA: Diagnosis not present

## 2020-03-12 DIAGNOSIS — E43 Unspecified severe protein-calorie malnutrition: Secondary | ICD-10-CM | POA: Diagnosis not present

## 2020-03-12 DIAGNOSIS — L89314 Pressure ulcer of right buttock, stage 4: Secondary | ICD-10-CM | POA: Diagnosis not present

## 2020-03-12 DIAGNOSIS — G309 Alzheimer's disease, unspecified: Secondary | ICD-10-CM | POA: Diagnosis not present

## 2020-03-19 DIAGNOSIS — L89314 Pressure ulcer of right buttock, stage 4: Secondary | ICD-10-CM | POA: Diagnosis not present

## 2020-03-19 DIAGNOSIS — F0281 Dementia in other diseases classified elsewhere with behavioral disturbance: Secondary | ICD-10-CM | POA: Diagnosis not present

## 2020-03-19 DIAGNOSIS — G309 Alzheimer's disease, unspecified: Secondary | ICD-10-CM | POA: Diagnosis not present

## 2020-03-19 DIAGNOSIS — M2450 Contracture, unspecified joint: Secondary | ICD-10-CM | POA: Diagnosis not present

## 2020-03-19 DIAGNOSIS — R1310 Dysphagia, unspecified: Secondary | ICD-10-CM | POA: Diagnosis not present

## 2020-03-19 DIAGNOSIS — E43 Unspecified severe protein-calorie malnutrition: Secondary | ICD-10-CM | POA: Diagnosis not present

## 2020-03-26 DIAGNOSIS — G309 Alzheimer's disease, unspecified: Secondary | ICD-10-CM | POA: Diagnosis not present

## 2020-03-26 DIAGNOSIS — R1310 Dysphagia, unspecified: Secondary | ICD-10-CM | POA: Diagnosis not present

## 2020-03-26 DIAGNOSIS — M2450 Contracture, unspecified joint: Secondary | ICD-10-CM | POA: Diagnosis not present

## 2020-03-26 DIAGNOSIS — L89314 Pressure ulcer of right buttock, stage 4: Secondary | ICD-10-CM | POA: Diagnosis not present

## 2020-03-26 DIAGNOSIS — E43 Unspecified severe protein-calorie malnutrition: Secondary | ICD-10-CM | POA: Diagnosis not present

## 2020-03-26 DIAGNOSIS — F0281 Dementia in other diseases classified elsewhere with behavioral disturbance: Secondary | ICD-10-CM | POA: Diagnosis not present

## 2020-04-02 DIAGNOSIS — R1310 Dysphagia, unspecified: Secondary | ICD-10-CM | POA: Diagnosis not present

## 2020-04-02 DIAGNOSIS — E43 Unspecified severe protein-calorie malnutrition: Secondary | ICD-10-CM | POA: Diagnosis not present

## 2020-04-02 DIAGNOSIS — G309 Alzheimer's disease, unspecified: Secondary | ICD-10-CM | POA: Diagnosis not present

## 2020-04-02 DIAGNOSIS — L89314 Pressure ulcer of right buttock, stage 4: Secondary | ICD-10-CM | POA: Diagnosis not present

## 2020-04-02 DIAGNOSIS — F0281 Dementia in other diseases classified elsewhere with behavioral disturbance: Secondary | ICD-10-CM | POA: Diagnosis not present

## 2020-04-02 DIAGNOSIS — M2450 Contracture, unspecified joint: Secondary | ICD-10-CM | POA: Diagnosis not present

## 2020-04-05 DIAGNOSIS — F0281 Dementia in other diseases classified elsewhere with behavioral disturbance: Secondary | ICD-10-CM | POA: Diagnosis not present

## 2020-04-05 DIAGNOSIS — M2450 Contracture, unspecified joint: Secondary | ICD-10-CM | POA: Diagnosis not present

## 2020-04-05 DIAGNOSIS — L89314 Pressure ulcer of right buttock, stage 4: Secondary | ICD-10-CM | POA: Diagnosis not present

## 2020-04-05 DIAGNOSIS — R1310 Dysphagia, unspecified: Secondary | ICD-10-CM | POA: Diagnosis not present

## 2020-04-05 DIAGNOSIS — E43 Unspecified severe protein-calorie malnutrition: Secondary | ICD-10-CM | POA: Diagnosis not present

## 2020-04-05 DIAGNOSIS — Z681 Body mass index (BMI) 19 or less, adult: Secondary | ICD-10-CM | POA: Diagnosis not present

## 2020-04-05 DIAGNOSIS — Z8781 Personal history of (healed) traumatic fracture: Secondary | ICD-10-CM | POA: Diagnosis not present

## 2020-04-05 DIAGNOSIS — G309 Alzheimer's disease, unspecified: Secondary | ICD-10-CM | POA: Diagnosis not present

## 2020-04-09 DIAGNOSIS — R1310 Dysphagia, unspecified: Secondary | ICD-10-CM | POA: Diagnosis not present

## 2020-04-09 DIAGNOSIS — F0281 Dementia in other diseases classified elsewhere with behavioral disturbance: Secondary | ICD-10-CM | POA: Diagnosis not present

## 2020-04-09 DIAGNOSIS — G309 Alzheimer's disease, unspecified: Secondary | ICD-10-CM | POA: Diagnosis not present

## 2020-04-09 DIAGNOSIS — M2450 Contracture, unspecified joint: Secondary | ICD-10-CM | POA: Diagnosis not present

## 2020-04-09 DIAGNOSIS — L89314 Pressure ulcer of right buttock, stage 4: Secondary | ICD-10-CM | POA: Diagnosis not present

## 2020-04-09 DIAGNOSIS — E43 Unspecified severe protein-calorie malnutrition: Secondary | ICD-10-CM | POA: Diagnosis not present

## 2020-04-17 DIAGNOSIS — F0281 Dementia in other diseases classified elsewhere with behavioral disturbance: Secondary | ICD-10-CM | POA: Diagnosis not present

## 2020-04-17 DIAGNOSIS — G309 Alzheimer's disease, unspecified: Secondary | ICD-10-CM | POA: Diagnosis not present

## 2020-04-17 DIAGNOSIS — R1310 Dysphagia, unspecified: Secondary | ICD-10-CM | POA: Diagnosis not present

## 2020-04-17 DIAGNOSIS — M2450 Contracture, unspecified joint: Secondary | ICD-10-CM | POA: Diagnosis not present

## 2020-04-17 DIAGNOSIS — E43 Unspecified severe protein-calorie malnutrition: Secondary | ICD-10-CM | POA: Diagnosis not present

## 2020-04-17 DIAGNOSIS — L89314 Pressure ulcer of right buttock, stage 4: Secondary | ICD-10-CM | POA: Diagnosis not present

## 2020-04-24 DIAGNOSIS — E43 Unspecified severe protein-calorie malnutrition: Secondary | ICD-10-CM | POA: Diagnosis not present

## 2020-04-24 DIAGNOSIS — L89314 Pressure ulcer of right buttock, stage 4: Secondary | ICD-10-CM | POA: Diagnosis not present

## 2020-04-24 DIAGNOSIS — F0281 Dementia in other diseases classified elsewhere with behavioral disturbance: Secondary | ICD-10-CM | POA: Diagnosis not present

## 2020-04-24 DIAGNOSIS — M2450 Contracture, unspecified joint: Secondary | ICD-10-CM | POA: Diagnosis not present

## 2020-04-24 DIAGNOSIS — G309 Alzheimer's disease, unspecified: Secondary | ICD-10-CM | POA: Diagnosis not present

## 2020-04-24 DIAGNOSIS — R1310 Dysphagia, unspecified: Secondary | ICD-10-CM | POA: Diagnosis not present

## 2020-05-01 DIAGNOSIS — L89314 Pressure ulcer of right buttock, stage 4: Secondary | ICD-10-CM | POA: Diagnosis not present

## 2020-05-01 DIAGNOSIS — G309 Alzheimer's disease, unspecified: Secondary | ICD-10-CM | POA: Diagnosis not present

## 2020-05-01 DIAGNOSIS — F0281 Dementia in other diseases classified elsewhere with behavioral disturbance: Secondary | ICD-10-CM | POA: Diagnosis not present

## 2020-05-01 DIAGNOSIS — M2450 Contracture, unspecified joint: Secondary | ICD-10-CM | POA: Diagnosis not present

## 2020-05-01 DIAGNOSIS — R1310 Dysphagia, unspecified: Secondary | ICD-10-CM | POA: Diagnosis not present

## 2020-05-01 DIAGNOSIS — E43 Unspecified severe protein-calorie malnutrition: Secondary | ICD-10-CM | POA: Diagnosis not present

## 2020-05-05 DIAGNOSIS — L89314 Pressure ulcer of right buttock, stage 4: Secondary | ICD-10-CM | POA: Diagnosis not present

## 2020-05-05 DIAGNOSIS — Z8781 Personal history of (healed) traumatic fracture: Secondary | ICD-10-CM | POA: Diagnosis not present

## 2020-05-05 DIAGNOSIS — M2450 Contracture, unspecified joint: Secondary | ICD-10-CM | POA: Diagnosis not present

## 2020-05-05 DIAGNOSIS — E43 Unspecified severe protein-calorie malnutrition: Secondary | ICD-10-CM | POA: Diagnosis not present

## 2020-05-05 DIAGNOSIS — G309 Alzheimer's disease, unspecified: Secondary | ICD-10-CM | POA: Diagnosis not present

## 2020-05-05 DIAGNOSIS — Z681 Body mass index (BMI) 19 or less, adult: Secondary | ICD-10-CM | POA: Diagnosis not present

## 2020-05-05 DIAGNOSIS — R1310 Dysphagia, unspecified: Secondary | ICD-10-CM | POA: Diagnosis not present

## 2020-05-05 DIAGNOSIS — F0281 Dementia in other diseases classified elsewhere with behavioral disturbance: Secondary | ICD-10-CM | POA: Diagnosis not present

## 2020-05-06 DIAGNOSIS — R1310 Dysphagia, unspecified: Secondary | ICD-10-CM | POA: Diagnosis not present

## 2020-05-06 DIAGNOSIS — E43 Unspecified severe protein-calorie malnutrition: Secondary | ICD-10-CM | POA: Diagnosis not present

## 2020-05-06 DIAGNOSIS — F0281 Dementia in other diseases classified elsewhere with behavioral disturbance: Secondary | ICD-10-CM | POA: Diagnosis not present

## 2020-05-06 DIAGNOSIS — L89314 Pressure ulcer of right buttock, stage 4: Secondary | ICD-10-CM | POA: Diagnosis not present

## 2020-05-06 DIAGNOSIS — M2450 Contracture, unspecified joint: Secondary | ICD-10-CM | POA: Diagnosis not present

## 2020-05-06 DIAGNOSIS — G309 Alzheimer's disease, unspecified: Secondary | ICD-10-CM | POA: Diagnosis not present

## 2020-05-08 DIAGNOSIS — M2450 Contracture, unspecified joint: Secondary | ICD-10-CM | POA: Diagnosis not present

## 2020-05-08 DIAGNOSIS — R1310 Dysphagia, unspecified: Secondary | ICD-10-CM | POA: Diagnosis not present

## 2020-05-08 DIAGNOSIS — L89314 Pressure ulcer of right buttock, stage 4: Secondary | ICD-10-CM | POA: Diagnosis not present

## 2020-05-08 DIAGNOSIS — F0281 Dementia in other diseases classified elsewhere with behavioral disturbance: Secondary | ICD-10-CM | POA: Diagnosis not present

## 2020-05-08 DIAGNOSIS — G309 Alzheimer's disease, unspecified: Secondary | ICD-10-CM | POA: Diagnosis not present

## 2020-05-08 DIAGNOSIS — E43 Unspecified severe protein-calorie malnutrition: Secondary | ICD-10-CM | POA: Diagnosis not present

## 2020-05-09 ENCOUNTER — Emergency Department (HOSPITAL_COMMUNITY)

## 2020-05-09 ENCOUNTER — Inpatient Hospital Stay (HOSPITAL_COMMUNITY)
Admission: EM | Admit: 2020-05-09 | Discharge: 2020-06-05 | DRG: 208 | Disposition: E | Attending: Critical Care Medicine | Admitting: Critical Care Medicine

## 2020-05-09 ENCOUNTER — Encounter (HOSPITAL_COMMUNITY): Payer: Self-pay

## 2020-05-09 DIAGNOSIS — R0689 Other abnormalities of breathing: Secondary | ICD-10-CM | POA: Diagnosis not present

## 2020-05-09 DIAGNOSIS — L89152 Pressure ulcer of sacral region, stage 2: Secondary | ICD-10-CM | POA: Diagnosis present

## 2020-05-09 DIAGNOSIS — F0390 Unspecified dementia without behavioral disturbance: Secondary | ICD-10-CM | POA: Diagnosis not present

## 2020-05-09 DIAGNOSIS — R627 Adult failure to thrive: Secondary | ICD-10-CM | POA: Diagnosis present

## 2020-05-09 DIAGNOSIS — F05 Delirium due to known physiological condition: Secondary | ICD-10-CM | POA: Diagnosis present

## 2020-05-09 DIAGNOSIS — D696 Thrombocytopenia, unspecified: Secondary | ICD-10-CM | POA: Diagnosis present

## 2020-05-09 DIAGNOSIS — J9601 Acute respiratory failure with hypoxia: Principal | ICD-10-CM | POA: Diagnosis present

## 2020-05-09 DIAGNOSIS — Z8249 Family history of ischemic heart disease and other diseases of the circulatory system: Secondary | ICD-10-CM

## 2020-05-09 DIAGNOSIS — L89123 Pressure ulcer of left upper back, stage 3: Secondary | ICD-10-CM | POA: Diagnosis present

## 2020-05-09 DIAGNOSIS — L89894 Pressure ulcer of other site, stage 4: Secondary | ICD-10-CM | POA: Diagnosis present

## 2020-05-09 DIAGNOSIS — Z681 Body mass index (BMI) 19 or less, adult: Secondary | ICD-10-CM | POA: Diagnosis not present

## 2020-05-09 DIAGNOSIS — L89150 Pressure ulcer of sacral region, unstageable: Secondary | ICD-10-CM | POA: Diagnosis present

## 2020-05-09 DIAGNOSIS — J9691 Respiratory failure, unspecified with hypoxia: Secondary | ICD-10-CM | POA: Diagnosis present

## 2020-05-09 DIAGNOSIS — R64 Cachexia: Secondary | ICD-10-CM | POA: Diagnosis present

## 2020-05-09 DIAGNOSIS — Z5189 Encounter for other specified aftercare: Secondary | ICD-10-CM

## 2020-05-09 DIAGNOSIS — J690 Pneumonitis due to inhalation of food and vomit: Secondary | ICD-10-CM | POA: Diagnosis present

## 2020-05-09 DIAGNOSIS — J90 Pleural effusion, not elsewhere classified: Secondary | ICD-10-CM | POA: Diagnosis present

## 2020-05-09 DIAGNOSIS — R0902 Hypoxemia: Secondary | ICD-10-CM

## 2020-05-09 DIAGNOSIS — Z7189 Other specified counseling: Secondary | ICD-10-CM | POA: Diagnosis not present

## 2020-05-09 DIAGNOSIS — Z880 Allergy status to penicillin: Secondary | ICD-10-CM

## 2020-05-09 DIAGNOSIS — E43 Unspecified severe protein-calorie malnutrition: Secondary | ICD-10-CM | POA: Diagnosis not present

## 2020-05-09 DIAGNOSIS — Z79899 Other long term (current) drug therapy: Secondary | ICD-10-CM

## 2020-05-09 DIAGNOSIS — F419 Anxiety disorder, unspecified: Secondary | ICD-10-CM | POA: Diagnosis present

## 2020-05-09 DIAGNOSIS — R1310 Dysphagia, unspecified: Secondary | ICD-10-CM | POA: Diagnosis not present

## 2020-05-09 DIAGNOSIS — I10 Essential (primary) hypertension: Secondary | ICD-10-CM | POA: Diagnosis present

## 2020-05-09 DIAGNOSIS — Z9109 Other allergy status, other than to drugs and biological substances: Secondary | ICD-10-CM

## 2020-05-09 DIAGNOSIS — R4020 Unspecified coma: Secondary | ICD-10-CM | POA: Diagnosis not present

## 2020-05-09 DIAGNOSIS — G309 Alzheimer's disease, unspecified: Secondary | ICD-10-CM | POA: Diagnosis not present

## 2020-05-09 DIAGNOSIS — M1990 Unspecified osteoarthritis, unspecified site: Secondary | ICD-10-CM | POA: Diagnosis present

## 2020-05-09 DIAGNOSIS — Z20822 Contact with and (suspected) exposure to covid-19: Secondary | ICD-10-CM | POA: Diagnosis present

## 2020-05-09 DIAGNOSIS — G9340 Encephalopathy, unspecified: Secondary | ICD-10-CM | POA: Insufficient documentation

## 2020-05-09 DIAGNOSIS — Z7982 Long term (current) use of aspirin: Secondary | ICD-10-CM

## 2020-05-09 DIAGNOSIS — J9811 Atelectasis: Secondary | ICD-10-CM | POA: Diagnosis present

## 2020-05-09 DIAGNOSIS — M2450 Contracture, unspecified joint: Secondary | ICD-10-CM | POA: Diagnosis not present

## 2020-05-09 DIAGNOSIS — R404 Transient alteration of awareness: Secondary | ICD-10-CM | POA: Diagnosis not present

## 2020-05-09 DIAGNOSIS — R34 Anuria and oliguria: Secondary | ICD-10-CM | POA: Diagnosis not present

## 2020-05-09 DIAGNOSIS — M40209 Unspecified kyphosis, site unspecified: Secondary | ICD-10-CM | POA: Diagnosis present

## 2020-05-09 DIAGNOSIS — F0391 Unspecified dementia with behavioral disturbance: Secondary | ICD-10-CM | POA: Diagnosis present

## 2020-05-09 DIAGNOSIS — L89314 Pressure ulcer of right buttock, stage 4: Secondary | ICD-10-CM | POA: Diagnosis not present

## 2020-05-09 DIAGNOSIS — D649 Anemia, unspecified: Secondary | ICD-10-CM | POA: Diagnosis present

## 2020-05-09 DIAGNOSIS — R54 Age-related physical debility: Secondary | ICD-10-CM | POA: Diagnosis present

## 2020-05-09 DIAGNOSIS — Z66 Do not resuscitate: Secondary | ICD-10-CM | POA: Diagnosis not present

## 2020-05-09 DIAGNOSIS — T17900A Unspecified foreign body in respiratory tract, part unspecified causing asphyxiation, initial encounter: Secondary | ICD-10-CM | POA: Diagnosis present

## 2020-05-09 DIAGNOSIS — L8990 Pressure ulcer of unspecified site, unspecified stage: Secondary | ICD-10-CM | POA: Insufficient documentation

## 2020-05-09 DIAGNOSIS — Z87891 Personal history of nicotine dependence: Secondary | ICD-10-CM

## 2020-05-09 DIAGNOSIS — R092 Respiratory arrest: Secondary | ICD-10-CM | POA: Diagnosis not present

## 2020-05-09 DIAGNOSIS — Z9911 Dependence on respirator [ventilator] status: Secondary | ICD-10-CM | POA: Diagnosis not present

## 2020-05-09 DIAGNOSIS — F0281 Dementia in other diseases classified elsewhere with behavioral disturbance: Secondary | ICD-10-CM | POA: Diagnosis not present

## 2020-05-09 DIAGNOSIS — Z7401 Bed confinement status: Secondary | ICD-10-CM

## 2020-05-09 LAB — I-STAT ARTERIAL BLOOD GAS, ED
Acid-Base Excess: 4 mmol/L — ABNORMAL HIGH (ref 0.0–2.0)
Bicarbonate: 26.5 mmol/L (ref 20.0–28.0)
Calcium, Ion: 1.06 mmol/L — ABNORMAL LOW (ref 1.15–1.40)
HCT: 37 % (ref 36.0–46.0)
Hemoglobin: 12.6 g/dL (ref 12.0–15.0)
O2 Saturation: 100 %
Potassium: 3.4 mmol/L — ABNORMAL LOW (ref 3.5–5.1)
Sodium: 140 mmol/L (ref 135–145)
TCO2: 27 mmol/L (ref 22–32)
pCO2 arterial: 31.3 mmHg — ABNORMAL LOW (ref 32.0–48.0)
pH, Arterial: 7.536 — ABNORMAL HIGH (ref 7.350–7.450)
pO2, Arterial: 338 mmHg — ABNORMAL HIGH (ref 83.0–108.0)

## 2020-05-09 LAB — CBC WITH DIFFERENTIAL/PLATELET
Abs Immature Granulocytes: 0.03 10*3/uL (ref 0.00–0.07)
Basophils Absolute: 0 10*3/uL (ref 0.0–0.1)
Basophils Relative: 1 %
Eosinophils Absolute: 0.1 10*3/uL (ref 0.0–0.5)
Eosinophils Relative: 1 %
HCT: 42.3 % (ref 36.0–46.0)
Hemoglobin: 13.2 g/dL (ref 12.0–15.0)
Immature Granulocytes: 1 %
Lymphocytes Relative: 11 %
Lymphs Abs: 0.6 10*3/uL — ABNORMAL LOW (ref 0.7–4.0)
MCH: 32.8 pg (ref 26.0–34.0)
MCHC: 31.2 g/dL (ref 30.0–36.0)
MCV: 105 fL — ABNORMAL HIGH (ref 80.0–100.0)
Monocytes Absolute: 0.2 10*3/uL (ref 0.1–1.0)
Monocytes Relative: 3 %
Neutro Abs: 4.5 10*3/uL (ref 1.7–7.7)
Neutrophils Relative %: 83 %
Platelets: UNDETERMINED 10*3/uL (ref 150–400)
RBC: 4.03 MIL/uL (ref 3.87–5.11)
RDW: 15.4 % (ref 11.5–15.5)
WBC: 5.4 10*3/uL (ref 4.0–10.5)
nRBC: 0 % (ref 0.0–0.2)

## 2020-05-09 LAB — COMPREHENSIVE METABOLIC PANEL
ALT: 10 U/L (ref 0–44)
AST: 22 U/L (ref 15–41)
Albumin: 1.9 g/dL — ABNORMAL LOW (ref 3.5–5.0)
Alkaline Phosphatase: 94 U/L (ref 38–126)
Anion gap: 12 (ref 5–15)
BUN: 16 mg/dL (ref 8–23)
CO2: 22 mmol/L (ref 22–32)
Calcium: 8 mg/dL — ABNORMAL LOW (ref 8.9–10.3)
Chloride: 104 mmol/L (ref 98–111)
Creatinine, Ser: 0.55 mg/dL (ref 0.44–1.00)
GFR, Estimated: >60 mL/min (ref >60)
Glucose, Bld: 142 mg/dL — ABNORMAL HIGH (ref 70–99)
Potassium: 4.7 mmol/L (ref 3.5–5.1)
Sodium: 138 mmol/L (ref 135–145)
Total Bilirubin: 0.7 mg/dL (ref 0.3–1.2)
Total Protein: 5 g/dL — ABNORMAL LOW (ref 6.5–8.1)

## 2020-05-09 LAB — POCT I-STAT 7, (LYTES, BLD GAS, ICA,H+H)
Acid-base deficit: 1 mmol/L (ref 0.0–2.0)
Bicarbonate: 22.9 mmol/L (ref 20.0–28.0)
Calcium, Ion: 1.16 mmol/L (ref 1.15–1.40)
HCT: 36 % (ref 36.0–46.0)
Hemoglobin: 12.2 g/dL (ref 12.0–15.0)
O2 Saturation: 97 %
Patient temperature: 96.4
Potassium: 4.6 mmol/L (ref 3.5–5.1)
Sodium: 137 mmol/L (ref 135–145)
TCO2: 24 mmol/L (ref 22–32)
pCO2 arterial: 34.8 mmHg (ref 32.0–48.0)
pH, Arterial: 7.421 (ref 7.350–7.450)
pO2, Arterial: 80 mmHg — ABNORMAL LOW (ref 83.0–108.0)

## 2020-05-09 LAB — RESP PANEL BY RT-PCR (FLU A&B, COVID) ARPGX2
Influenza A by PCR: NEGATIVE
Influenza B by PCR: NEGATIVE
SARS Coronavirus 2 by RT PCR: NEGATIVE

## 2020-05-09 LAB — MRSA PCR SCREENING: MRSA by PCR: NEGATIVE

## 2020-05-09 LAB — PROTIME-INR
INR: 1.2 (ref 0.8–1.2)
Prothrombin Time: 15 seconds (ref 11.4–15.2)

## 2020-05-09 LAB — GLUCOSE, CAPILLARY: Glucose-Capillary: 115 mg/dL — ABNORMAL HIGH (ref 70–99)

## 2020-05-09 MED ORDER — ROCURONIUM BROMIDE 50 MG/5ML IV SOLN
INTRAVENOUS | Status: AC | PRN
  Administered 2020-05-09: 60 mg via INTRAVENOUS

## 2020-05-09 MED ORDER — POLYETHYLENE GLYCOL 3350 17 G PO PACK: 17.0000 g | PACK | Freq: Every day | ORAL | Status: DC | PRN

## 2020-05-09 MED ORDER — SUCCINYLCHOLINE CHLORIDE 200 MG/10ML IV SOSY
PREFILLED_SYRINGE | INTRAVENOUS | Status: AC
  Filled 2020-05-09: qty 10

## 2020-05-09 MED ORDER — ETOMIDATE 2 MG/ML IV SOLN
INTRAVENOUS | Status: AC | PRN
  Administered 2020-05-09: 15 mg via INTRAVENOUS

## 2020-05-09 MED ORDER — QUETIAPINE FUMARATE 25 MG PO TABS
25.0000 mg | ORAL_TABLET | Freq: Two times a day (BID) | ORAL | Status: DC
  Administered 2020-05-11: 25 mg via ORAL
  Filled 2020-05-09: qty 1

## 2020-05-09 MED ORDER — FENTANYL CITRATE (PF) 100 MCG/2ML IJ SOLN: 25.0000 ug | Freq: Once | INTRAMUSCULAR | Status: DC

## 2020-05-09 MED ORDER — LACTATED RINGERS IV SOLN: INTRAVENOUS | Status: DC

## 2020-05-09 MED ORDER — SODIUM CHLORIDE 0.9 % IV SOLN
INTRAVENOUS | Status: AC | PRN
  Administered 2020-05-09: 2000 mL via INTRAVENOUS

## 2020-05-09 MED ORDER — FENTANYL BOLUS VIA INFUSION
25.0000 ug | INTRAVENOUS | Status: DC | PRN
  Filled 2020-05-09: qty 100

## 2020-05-09 MED ORDER — CHLORHEXIDINE GLUCONATE 0.12% ORAL RINSE (MEDLINE KIT)
15.0000 mL | Freq: Two times a day (BID) | OROMUCOSAL | Status: DC
  Administered 2020-05-10 – 2020-05-11 (×4): 15 mL via OROMUCOSAL

## 2020-05-09 MED ORDER — ALBUTEROL SULFATE (2.5 MG/3ML) 0.083% IN NEBU: 2.5000 mg | INHALATION_SOLUTION | Freq: Four times a day (QID) | RESPIRATORY_TRACT | Status: DC | PRN

## 2020-05-09 MED ORDER — ORAL CARE MOUTH RINSE
15.0000 mL | OROMUCOSAL | Status: DC
  Administered 2020-05-10 – 2020-05-11 (×9): 15 mL via OROMUCOSAL

## 2020-05-09 MED ORDER — ETOMIDATE 2 MG/ML IV SOLN
INTRAVENOUS | Status: AC
  Filled 2020-05-09: qty 20

## 2020-05-09 MED ORDER — PIPERACILLIN-TAZOBACTAM 3.375 G IVPB
3.3750 g | Freq: Three times a day (TID) | INTRAVENOUS | Status: DC
  Administered 2020-05-09 – 2020-05-10 (×2): 3.375 g via INTRAVENOUS
  Filled 2020-05-09 (×2): qty 50

## 2020-05-09 MED ORDER — HEPARIN SODIUM (PORCINE) 5000 UNIT/ML IJ SOLN
5000.0000 [IU] | Freq: Three times a day (TID) | INTRAMUSCULAR | Status: DC
  Administered 2020-05-09 – 2020-05-11 (×7): 5000 [IU] via SUBCUTANEOUS
  Filled 2020-05-09 (×7): qty 1

## 2020-05-09 MED ORDER — FENTANYL 2500MCG IN NS 250ML (10MCG/ML) PREMIX INFUSION
25.0000 ug/h | INTRAVENOUS | Status: DC
  Administered 2020-05-09: 50 ug/h via INTRAVENOUS
  Filled 2020-05-09: qty 250

## 2020-05-09 MED ORDER — DIVALPROEX SODIUM 125 MG PO CSDR
125.0000 mg | DELAYED_RELEASE_CAPSULE | Freq: Two times a day (BID) | ORAL | Status: DC
  Administered 2020-05-11: 125 mg via ORAL
  Filled 2020-05-09: qty 1

## 2020-05-09 MED ORDER — DULOXETINE HCL 60 MG PO CPEP: 60.0000 mg | ORAL_CAPSULE | Freq: Every day | ORAL | Status: DC

## 2020-05-09 MED ORDER — PANTOPRAZOLE SODIUM 40 MG IV SOLR
40.0000 mg | Freq: Every day | INTRAVENOUS | Status: DC
  Administered 2020-05-09: 40 mg via INTRAVENOUS
  Filled 2020-05-09: qty 40

## 2020-05-09 MED ORDER — ROCURONIUM BROMIDE 10 MG/ML (PF) SYRINGE
PREFILLED_SYRINGE | INTRAVENOUS | Status: AC
  Filled 2020-05-09: qty 10

## 2020-05-09 MED ORDER — DOCUSATE SODIUM 100 MG PO CAPS: 100.0000 mg | ORAL_CAPSULE | Freq: Two times a day (BID) | ORAL | Status: DC | PRN

## 2020-05-09 NOTE — Code Documentation (Signed)
Family at beside. Family given emotional support. 

## 2020-05-09 NOTE — ED Notes (Signed)
DO is talking to pt POA at this time.  Pt is currently being bagged

## 2020-05-09 NOTE — Code Documentation (Signed)
Portable chest x-ray complete.

## 2020-05-09 NOTE — H&P (Addendum)
NAME:  Yolanda Shaw, MRN:  [id number], DOB:  09-27-1931, LOS: 0 ADMISSION DATE:  05/12/2020, CONSULTATION DATE:  05/14/2020 REFERRING MD:  Tegeler CHIEF COMPLAINT:  Hypoxia   History of Present Illness:  Yolanda Shaw is a 87 y.o. F with PMH of Dementia, HTN, multiple fx's of pelvis, anxiety, AKI.  She presented to [facility] ED 5/5 with respiratory distress and hypoxic respiratory failure.  EMS was called to her home for unresponsiveness and upon their arrival, she did have a pulse but required BVM.  Sats were in the 40's.  They attempted intubation but met resistance.  She was brought to ED where she was later intubated.  There was a concern for foreign body aspiration.  PCCM was called for bedside bronchoscopy and admission.  Dr. Carlis Abbott performed bedside bronch but there was no foreign body found.  Plan was to admit her to the ICU and once paralytics from intubation are worn off, goa head with extubation if she meets criteria.  Per discussion with family, pt does have a hx of aspiration and they feel she could have aspirated her baby food this evening since she was fine prior to eating, then suddenly began to have distress.  They also note that she enrolled with hospice in November 2021 due to being bed ridden and multiple medical problems.  Daughter Yolanda Shaw initially asked for full code but after further discussions, agreed to continued supportive care with mechanical ventilation, using ACLS drugs as needed; however, NO CPR due to the risks of further harm that come with this.   Pertinent  Medical History   has a past medical history of AKI (acute kidney injury) ([place]), Anemia, Anxiety, Arthritis, Bilateral pubic rami fractures ([place]) (06/29/2014), Closed fracture of right proximal humerus, Closed stable fracture of multiple pubic rami ([place]) (02/17/2016), Dementia ([place]), Dementia with behavioral disturbance ([place]), Essential hypertension, Fracture of left pubis ([place]), Ischium fracture ([place]), Pelvic fracture ([place])  (06/30/2014), Pressure ulcer (06/30/2014), Thrombocytopenia ([place]) (06/30/2014), and Tremor (06/30/2014).  Significant Hospital Events: Including procedures, antibiotic start and stop dates in addition to other pertinent events   5/5 > admit.  Micro Data: COVID 5/5 >  Sputum 5/5 >  Antibiotics: Zosyn 5/5 >   Interim History / Subjective:  Sedated and paralyzed for intubation. Bedside bronch by Dr. Carlis Abbott did not reveal any foreign body.  Objective   Blood pressure (!) 146/106, pulse (!) 120, resp. rate (!) 39, height '5\' 7"'  (1.702 m), weight 48 kg, SpO2 100 %.    Vent Mode: PRVC FiO2 (%):  [100 %] 100 % Set Rate:  [16 bmp] 16 bmp Vt Set:  [490 mL] 490 mL PEEP:  [5 cmH20] 5 cmH20 Plateau Pressure:  [20 cmH20] 20 cmH20  No intake or output data in the 24 hours ending 06/01/2020 1930 Filed Weights   05/27/2020 1845  Weight: 48 kg    Examination: General: Elderly female, cachectic, resting in bed, in NAD. Neuro: Sedated, not following commands. HEENT: [place]/AT. Sclerae anicteric. ETT in place. Cardiovascular: RRR, no M/R/G.  Lungs: Respirations even and unlabored.  CTA bilaterally, No W/R/R. Abdomen: BS x 4, soft, NT/ND.  Musculoskeletal: Muscle wasting. No gross deformities, no edema.  Skin: Intact, warm, no rashes.  Resolved Hospital Problem list     Assessment & Plan:   Acute hypoxic respiratory failure with concern for foreign body aspiration - bedside bronch with no foreign body noted.  Unclear whether positive pressure could have pushed said foreign body into distal bronchi.  Per family, she  does have a hx of aspiration and she could have easily aspirated her food this evening prior to events that occurred (she only eats baby food). - Admit to ICU. - Allow sedation and paralytics to wear off then attempt extubation if she meets criteria. - Empiric Zosyn for now. - Send sputum culture. - Bronchial hygiene.  Hx anxiety, dementia. - Continue home Divalproex, Duloxetine,  Quetiapine starting tomorrow. - Hold home Alprazolam.  Rest of labs all pending.   Best practice (right click and "Reselect all SmartList Selections" daily)  Diet:  NPO Pain/Anxiety/Delirium protocol (if indicated): No VAP protocol (if indicated): Yes DVT prophylaxis: Subcutaneous Heparin GI prophylaxis: PPI Glucose control:  SSI No Central venous access:  N/A Arterial line:  N/A Foley:  N/A Mobility:  bed rest  PT consulted: N/A Last date of multidisciplinary goals of care discussion: None Code Status:  limited - NO CPR. Disposition: ICU  Labs   CBC: No results for input(s): WBC, NEUTROABS, HGB, HCT, MCV, PLT in the last 168 hours.  Basic Metabolic Panel: No results for input(s): NA, K, CL, CO2, GLUCOSE, BUN, CREATININE, CALCIUM, MG, PHOS in the last 168 hours. GFR: CrCl cannot be calculated (Patient's most recent lab result is older than the maximum 21 days allowed.). No results for input(s): PROCALCITON, WBC, LATICACIDVEN in the last 168 hours.  Liver Function Tests: No results for input(s): AST, ALT, ALKPHOS, BILITOT, PROT, ALBUMIN in the last 168 hours. No results for input(s): LIPASE, AMYLASE in the last 168 hours. No results for input(s): AMMONIA in the last 168 hours.  ABG No results found for: PHART, PCO2ART, PO2ART, HCO3, TCO2, ACIDBASEDEF, O2SAT   Coagulation Profile: No results for input(s): INR, PROTIME in the last 168 hours.  Cardiac Enzymes: No results for input(s): CKTOTAL, CKMB, CKMBINDEX, TROPONINI in the last 168 hours.  HbA1C: Hgb A1c MFr Bld  Date/Time Value Ref Range Status  10/19/2016 07:24 AM 5.7 (H) 4.8 - 5.6 % Final    Comment:    (NOTE) Pre diabetes:          5.7%-6.4% Diabetes:              >6.4% Glycemic control for   <7.0% adults with diabetes     CBG: No results for input(s): GLUCAP in the last 168 hours.  Review of Systems:   Unable to obtain as pt is encephalopathic.  Past Medical History:  She,  has a past medical  history of AKI (acute kidney injury) ([place]), Anemia, Anxiety, Arthritis, Bilateral pubic rami fractures ([place]) (06/29/2014), Closed fracture of right proximal humerus, Closed stable fracture of multiple pubic rami ([place]) (02/17/2016), Dementia ([place]), Dementia with behavioral disturbance ([place]), Essential hypertension, Fracture of left pubis ([place]), Ischium fracture ([place]), Pelvic fracture ([place]) (06/30/2014), Pressure ulcer (06/30/2014), Thrombocytopenia ([place]) (06/30/2014), and Tremor (06/30/2014).   Surgical History:   Past Surgical History:  Procedure Laterality Date  . ADENOIDECTOMY    . INTRAMEDULLARY (IM) NAIL INTERTROCHANTERIC Left 10/19/2016   Procedure: INTRAMEDULLARY (IM) NAIL INTERTROCHANTRIC;  Surgeon: Meredith Pel, MD;  Location: [facility];  Service: Orthopedics;  Laterality: Left;  . SHOULDER SURGERY     right  . TONSILLECTOMY    . WRIST SURGERY       Social History:   reports that she quit smoking about 73 years ago. Her smoking use included cigarettes. She has never used smokeless tobacco. She reports current alcohol use. She reports that she does not use drugs.   Family History:  Her family history includes  Hypertension in an other family member.   Allergies Allergies  Allergen Reactions  . Ether Other (See Comments)    Hard to wake up after procedure  . Amitriptyline Rash  . Penicillins Rash    Has patient had a PCN reaction causing immediate rash, facial/tongue/throat swelling, SOB or lightheadedness with hypotension: No Has patient had a PCN reaction causing severe rash involving mucus membranes or skin necrosis: No Has patient had a PCN reaction that required hospitalization No Has patient had a PCN reaction occurring within the last 10 years: No If all of the above answers are "NO", then may proceed with Cephalosporin use.     Home Medications  Prior to Admission medications   Medication Sig Start Date End Date Taking? Authorizing Provider  acetaminophen (TYLENOL) 325  MG tablet Take 2 tablets (650 mg total) by mouth every 6 (six) hours as needed for mild pain or fever. 10/22/16   Hongalgi, Lenis Dickinson, MD  ALPRAZolam Duanne Moron) 0.25 MG tablet Take 1 tablet (0.25 mg total) by mouth 2 (two) times daily as needed for anxiety. 10/22/16   Modena Jansky, MD  aspirin EC 325 MG EC tablet Take 1 tablet (325 mg total) by mouth daily with breakfast. 10/23/16   Hongalgi, Lenis Dickinson, MD  feeding supplement, ENSURE ENLIVE, (ENSURE ENLIVE) LIQD Take 237 mLs by mouth daily at 3 pm. 10/22/16   Hongalgi, Lenis Dickinson, MD  HYDROcodone-acetaminophen (NORCO/VICODIN) 5-325 MG tablet Take 1 tablet by mouth every 6 (six) hours as needed for moderate pain or severe pain. 10/22/16   Hongalgi, Lenis Dickinson, MD  memantine (NAMENDA) 10 MG tablet Take 10 mg by mouth 2 (two) times daily.    [provider]  senna (SENOKOT) 8.6 MG TABS tablet Take 1 tablet (8.6 mg total) by mouth daily. Patient taking differently: Take 1 tablet by mouth daily as needed for mild constipation.  02/19/16   Murlean Iba, MD     Critical care time: 35 min.    Montey Hora, [facility] For pager details, please see AMION or use Epic chat  After 1900, please call [facility] for cross coverage needs 05/22/2020, 8:07 PM

## 2020-05-09 NOTE — Code Documentation (Signed)
Pulmonary at bedside for possible foreign body d/c

## 2020-05-09 NOTE — ED Provider Notes (Signed)
[facility] Provider Note   CSN: [id number] Arrival date & time: 05/11/2020  1812     History Chief Complaint  Patient presents with  . Respiratory Distress    Yolanda Shaw is a 87 y.o. female.  HPI Notably, history and review of systems obtained as much as possible by EMS.  Upon arrival, patient is receiving BVM ventilations and unresponsive.  GCS 3.  Patient is a 87 year old female reportedly on hospice with surgery of thrombocytopenia and hypertension presenting for an unresponsive apneic event.  Per EMS, patient was eating and appeared to choke.  She had episode where she turned blue and became hypoxic.  Upon arrival, she is hypoxic to 60%.  BVM applied.  History and review of systems incredibly limited due to patient's status.    Past Medical History:  Diagnosis Date  . AKI (acute kidney injury) (HCC)   . Anemia    low iron  . Anxiety    panic attacks  . Arthritis   . Bilateral pubic rami fractures (HCC) 06/29/2014  . Closed fracture of right proximal humerus   . Closed stable fracture of multiple pubic rami (HCC) 02/17/2016  . Dementia (HCC)   . Dementia with behavioral disturbance (HCC)   . Essential hypertension   . Fracture of left pubis (HCC)   . Ischium fracture (HCC)   . Pelvic fracture (HCC) 06/30/2014  . Pressure ulcer 06/30/2014  . Thrombocytopenia (HCC) 06/30/2014  . Tremor 06/30/2014    Patient Active Problem List   Diagnosis Date Noted  . Respiratory failure with hypoxia (HCC) 07-31-2020  . Acute encephalopathy   . Aspiration pneumonia (HCC)   . Acute blood loss anemia   . Closed left hip fracture, initial encounter (HCC) 10/19/2016  . Hip fracture (HCC) 10/19/2016  . Anemia 03/02/2016  . Closed stable fracture of multiple pubic rami (HCC) 02/17/2016  . Closed fracture of right proximal humerus   . Essential hypertension   . AKI (acute kidney injury) (HCC)   . Dementia without behavioral disturbance (HCC)   .  Pelvic fracture (HCC) 06/30/2014  . Thrombocytopenia (HCC) 06/30/2014  . Tremor 06/30/2014  . Pressure ulcer 06/30/2014  . Fracture of left pubis (HCC)   . Ischium fracture (HCC)   . Left hip pain   . Bilateral pubic rami fractures (HCC) 06/29/2014  . Atrial fibrillation (HCC) 03/02/2014    Past Surgical History:  Procedure Laterality Date  . ADENOIDECTOMY    . INTRAMEDULLARY (IM) NAIL INTERTROCHANTERIC Left 10/19/2016   Procedure: INTRAMEDULLARY (IM) NAIL INTERTROCHANTRIC;  Surgeon: Cammy Copaean, Gregory Scott, MD;  [facility];  Service: Orthopedics;  Laterality: Left;  . SHOULDER SURGERY     right  . TONSILLECTOMY    . WRIST SURGERY       OB History   No obstetric history on file.     Family History  Problem Relation Age of Onset  . Hypertension Other     Social History   Tobacco Use  . Smoking status: Former Smoker    Types: Cigarettes    Quit date: 01/01/1947    Years since quitting: 73.4  . Smokeless tobacco: Never Used  Vaping Use  . Vaping Use: Never used  Substance Use Topics  . Alcohol use: Yes    Comment: occassional wine  . Drug use: No    Home Medications Prior to Admission medications   Medication Sig Start Date End Date Taking? Authorizing Provider  acetaminophen (TYLENOL) 325 MG tablet Take 2  tablets (650 mg total) by mouth every 6 (six) hours as needed for mild pain or fever. 10/22/16  Yes Hongalgi, Maximino Greenland, MD  ALPRAZolam Prudy Feeler) 0.25 MG tablet Take 1 tablet (0.25 mg total) by mouth 2 (two) times daily as needed for anxiety. 10/22/16  Yes Hongalgi, Maximino Greenland, MD  divalproex (DEPAKOTE SPRINKLE) 125 MG capsule Take 125 mg by mouth 2 (two) times daily. 04/17/20  Yes [provider]  doxycycline (VIBRAMYCIN) 100 MG capsule Take 100 mg by mouth 2 (two) times daily. 01/24/20  Yes [provider]  DULoxetine (CYMBALTA) 60 MG capsule Take 60 mg by mouth daily. 04/09/20  Yes [provider]  feeding supplement, ENSURE ENLIVE, (ENSURE  ENLIVE) LIQD Take 237 mLs by mouth daily at 3 pm. 10/22/16  Yes Hongalgi, Maximino Greenland, MD  metroNIDAZOLE (FLAGYL) 500 MG tablet Take 500 mg by mouth daily. 04/30/20  Yes [provider]  QUEtiapine (SEROQUEL) 25 MG tablet Take 25 mg by mouth 2 (two) times daily. 05/06/20  Yes [provider]    Allergies    Ether, Amitriptyline, and Penicillins  Review of Systems   Review of Systems  Unable to perform ROS: Patient unresponsive    Physical Exam Updated Vital Signs BP 116/82   Pulse 90   Temp (!) 96.4 F (35.8 C) (Oral) Comment: RN aware  Resp (!) 24   Ht 5\' 7"  (1.702 m)   Wt 48 kg   SpO2 100%   BMI 16.57 kg/m   Physical Exam Vitals and nursing note reviewed.  Constitutional:      Comments: Cachectic appearing female  HENT:     Head: Normocephalic.     Right Ear: External ear normal.     Left Ear: External ear normal.     Mouth/Throat:     Mouth: Mucous membranes are dry.  Eyes:     Comments: Pupils 3 mm bilaterally  Cardiovascular:     Rate and Rhythm: Normal rate and regular rhythm.  Pulmonary:     Comments: BVM, decreased breath sounds on right Abdominal:     Palpations: Abdomen is soft.     Tenderness: There is no guarding.  Musculoskeletal:     Cervical back: Neck supple.     Right lower leg: No edema.     Left lower leg: No edema.  Skin:    General: Skin is dry.     Capillary Refill: Capillary refill takes 2 to 3 seconds.  Neurological:     Comments: Not spontaneously moving any extremities.  GCS 3.  Psychiatric:     Comments: Deferred     ED Results / Procedures / Treatments   Labs (all labs ordered are listed, but only abnormal results are displayed) Labs Reviewed  GLUCOSE, CAPILLARY - Abnormal; Notable for the following components:      Result Value   Glucose-Capillary 115 (*)    All other components within normal limits  I-STAT ARTERIAL BLOOD GAS, ED - Abnormal; Notable for the following components:   pH, Arterial 7.536 (*)     pCO2 arterial 31.3 (*)    pO2, Arterial 338 (*)    Acid-Base Excess 4.0 (*)    Potassium 3.4 (*)    Calcium, Ion 1.06 (*)    All other components within normal limits  RESP PANEL BY RT-PCR (FLU A&B, COVID) ARPGX2  CULTURE, RESPIRATORY W GRAM STAIN  MRSA PCR SCREENING  CBC WITH DIFFERENTIAL/PLATELET  COMPREHENSIVE METABOLIC PANEL  PROTIME-INR  CBC  BASIC METABOLIC PANEL  BLOOD GAS, ARTERIAL  MAGNESIUM  PHOSPHORUS  BLOOD GAS, ARTERIAL  BLOOD GAS, ARTERIAL    EKG None  Radiology DG Chest Portable 1 View  Result Date: 05/20/2020 CLINICAL DATA:  Unresponsive intubated EXAM: PORTABLE CHEST 1 VIEW COMPARISON:  12/27/2016, CT 12/28/2012 FINDINGS: Endotracheal tube tip about 1.2 cm superior to the carina. Examination is limited by positioning, patient is rotated and the left upper extremity obscures the left mid to lower chest. No focal consolidation or pleural effusion. No definitive pneumothorax. Chronic fracture deformity of the right humeral neck. Cardiomediastinal silhouette within normal limits with aortic atherosclerosis. IMPRESSION: 1. Endotracheal tube tip about 1.2 cm superior to carina. 2. Limited exam secondary to positioning and habitus. No gross areas of acute airspace disease Electronically Signed   By: Jasmine Pang M.D.   On: 05/15/2020 19:28    Procedures Procedure Name: Intubation Date/Time: 05/15/2020 10:31 PM Performed by: Louretta Parma, DO Pre-anesthesia Checklist: Patient identified Oxygen Delivery Method: Ambu bag Preoxygenation: Pre-oxygenation with 100% oxygen Induction Type: Rapid sequence Ventilation: Mask ventilation without difficulty Laryngoscope Size: Glidescope Grade View: Grade I Tube size: 8.0 mm Number of attempts: 1 Airway Equipment and Method: Rigid stylet Placement Confirmation: ETT inserted through vocal cords under direct vision,  Positive ETCO2,  CO2 detector and Breath sounds checked- equal and bilateral Secured at: 23 cm Tube secured  with: ETT holder Dental Injury: Teeth and Oropharynx as per pre-operative assessment         Medications Ordered in ED Medications  rocuronium bromide 100 MG/10ML SOSY (has no administration in time range)  succinylcholine (ANECTINE) 200 MG/10ML syringe (has no administration in time range)  etomidate (AMIDATE) 2 MG/ML injection (has no administration in time range)  fentaNYL (SUBLIMAZE) injection 25 mcg (has no administration in time range)  heparin injection 5,000 Units (5,000 Units Subcutaneous Given 05/08/2020 2143)  pantoprazole (PROTONIX) injection 40 mg (40 mg Intravenous Given 05/14/2020 2138)  lactated ringers infusion (has no administration in time range)  docusate sodium (COLACE) capsule 100 mg (has no administration in time range)  polyethylene glycol (MIRALAX / GLYCOLAX) packet 17 g (has no administration in time range)  piperacillin-tazobactam (ZOSYN) IVPB 3.375 g (3.375 g Intravenous New Bag/Given 05/24/2020 2143)  albuterol (PROVENTIL) (2.5 MG/3ML) 0.083% nebulizer solution 2.5 mg (has no administration in time range)  divalproex (DEPAKOTE SPRINKLE) capsule 125 mg (has no administration in time range)  DULoxetine (CYMBALTA) DR capsule 60 mg (has no administration in time range)  QUEtiapine (SEROQUEL) tablet 25 mg (has no administration in time range)  0.9 %  sodium chloride infusion (0 mLs Intravenous Stopped 05/06/2020 1957)  etomidate (AMIDATE) injection (15 mg Intravenous Given 05/24/2020 1849)  rocuronium (ZEMURON) injection (60 mg Intravenous Given 05/11/2020 1850)    ED Course  I have reviewed the triage vital signs and the nursing notes.  Pertinent labs & imaging results that were available during my care of the patient were reviewed by me and considered in my medical decision making (see chart for details).    MDM Rules/Calculators/A&P                          Upon arrival, blood pressure soft but adequate.  She is unable to protect her airway due to GCS, has decreased breath  sounds on the right.  High suspicion for aspiration event.  Discussed CODE STATUS with son Yolanda Shaw and daughter Yolanda Shaw.  Yolanda Shaw believes patient would want intubation as there is a possibly reversible cause of  respiratory distress.  Yolanda Shaw defers to [employer].  1 L fluid bolus initiated as patient appears dry and has soft blood pressures, this resulted in improved blood pressures.  Patient intubated using etomidate and rocuronium.  We will use fentanyl pushes for sedation.  An 8.0 tube was placed.  Chest x-ray confirmed placement.  Pulmonary at bedside, plan for bronc and ICU admission.  Presentation most consistent with aspiration event.  Less likely pneumothorax without sign on chest x-ray.  Less likely ACS or PE.  Patient admitted to ICU with no acute changes in status.  Final Clinical Impression(s) / ED Diagnoses Final diagnoses:  None    Rx / DC Orders ED Discharge Orders    None       Louretta Parma, DO May 30, 2020 2233    Tegeler, Canary Brim, MD 05/10/20 2052    Tegeler, Canary Brim, MD 05/10/20 2052

## 2020-05-09 NOTE — Code Documentation (Signed)
NPA d/c

## 2020-05-09 NOTE — Progress Notes (Signed)
Patient transported to 3M09 without complications.

## 2020-05-09 NOTE — Procedures (Signed)
Bronchoscopy Procedure Note  Yolanda Shaw  [id number]  15-Sep-1931  Date:05/18/2020  Time:7:37 PM   Provider Performing:Yolanda Shaw   Procedure(s):  Initial Therapeutic Aspiration of Tracheobronchial Tree [phone])  Indication(s) Aspiration, acute respiratory failure with hypoxia  Consent Risks of the procedure as well as the alternatives and risks of each were explained to the patient and/or caregiver.  Consent for the procedure was obtained and is signed in the bedside chart. Verbal consent from family given to ED provider.  Anesthesia See MAR   Time Out Verified patient identification, verified procedure, site/side was marked, verified correct patient position, special equipment/implants available, medications/allergies/relevant history reviewed, required imaging and test results available.   Sterile Technique Usual hand hygiene, masks, gowns, and gloves were used   Procedure Description Bronchoscope advanced through endotracheal tube and into airway.  Airways were examined down to subsegmental level with findings noted below.   Following diagnostic evaluation, Therapeutic aspiration performed in all lobes to clear secretions. No solid foreign bodies identified.  Findings: copious thin secretions throughout tracheobronchial tree, especially RLL and RML. No large airway occlusions.   Complications/Tolerance None; patient tolerated the procedure well. Chest X-ray is not needed post procedure.   EBL 0   Specimen(s) None  Steffanie Dunn, DO 05/15/2020 7:39 PM [facility]

## 2020-05-09 NOTE — Progress Notes (Signed)
eLink Physician-Brief Progress Note Patient Name: Yolanda Shaw DOB: January 18, 1931 MRN: [id number]   Date of Service  05-25-20  HPI/Events of Note  Yolanda Shaw is a 87 y.o. F with PMH of Dementia, HTN, multiple fx's of pelvis, anxiety, AKI.  She presented to [facility] ED 5/5 with respiratory distress and hypoxic respiratory failure. Initial post intubation ABG on 100%/PRVC 24/TV 490/P 8 = 7.536/31.3/338. Otherwise, VSS.  eICU Interventions  Plan: 1. Wean FiO2 as tolerated. Keep Sat >= 92%. 2. Decrease PRVC rate to 18. 3. Repeat ABG at 11 PM.     Intervention Category Evaluation Type: New Patient Evaluation  Lenell Antu 05-25-20, 9:52 PM

## 2020-05-09 NOTE — Code Documentation (Signed)
Time out completed

## 2020-05-09 NOTE — ED Triage Notes (Signed)
Ems was called to home for unresponsive pt.  When EMS arrived pt. Did have a pulse but states that pt wasn't breathing they attempted to intubate and met resistance and state they deep suctions

## 2020-05-09 NOTE — Progress Notes (Signed)
Patient with aspiration and FB obstruction. ED notified of need for bronchoscopy. They have discussed this with her family and are intubating now.  Steffanie Dunn, DO 06/03/2020 6:48 PM [facility]

## 2020-05-09 NOTE — Code Documentation (Signed)
Care handoff complete to [facility], [place]

## 2020-05-09 NOTE — Code Documentation (Signed)
Time out performed for bronchoscopy verbal consent obtained by DO prior to procedure via telephone

## 2020-05-10 ENCOUNTER — Inpatient Hospital Stay (HOSPITAL_COMMUNITY)

## 2020-05-10 DIAGNOSIS — Z9911 Dependence on respirator [ventilator] status: Secondary | ICD-10-CM | POA: Diagnosis not present

## 2020-05-10 DIAGNOSIS — F0390 Unspecified dementia without behavioral disturbance: Secondary | ICD-10-CM | POA: Diagnosis not present

## 2020-05-10 DIAGNOSIS — J690 Pneumonitis due to inhalation of food and vomit: Secondary | ICD-10-CM | POA: Diagnosis not present

## 2020-05-10 DIAGNOSIS — Z7189 Other specified counseling: Secondary | ICD-10-CM | POA: Diagnosis not present

## 2020-05-10 DIAGNOSIS — J9601 Acute respiratory failure with hypoxia: Secondary | ICD-10-CM | POA: Diagnosis not present

## 2020-05-10 DIAGNOSIS — Z5189 Encounter for other specified aftercare: Secondary | ICD-10-CM

## 2020-05-10 LAB — CBC
HCT: 41.2 % (ref 36.0–46.0)
Hemoglobin: 13.2 g/dL (ref 12.0–15.0)
MCH: 32.8 pg (ref 26.0–34.0)
MCHC: 32 g/dL (ref 30.0–36.0)
MCV: 102.5 fL — ABNORMAL HIGH (ref 80.0–100.0)
Platelets: 146 10*3/uL — ABNORMAL LOW (ref 150–400)
RBC: 4.02 MIL/uL (ref 3.87–5.11)
RDW: 15.4 % (ref 11.5–15.5)
WBC: 12.6 10*3/uL — ABNORMAL HIGH (ref 4.0–10.5)
nRBC: 0 % (ref 0.0–0.2)

## 2020-05-10 LAB — GLUCOSE, CAPILLARY
Glucose-Capillary: 128 mg/dL — ABNORMAL HIGH (ref 70–99)
Glucose-Capillary: 115 mg/dL — ABNORMAL HIGH (ref 70–99)
Glucose-Capillary: 91 mg/dL (ref 70–99)

## 2020-05-10 LAB — PHOSPHORUS: Phosphorus: 3.5 mg/dL (ref 2.5–4.6)

## 2020-05-10 LAB — BASIC METABOLIC PANEL
Anion gap: 10 (ref 5–15)
BUN: 17 mg/dL (ref 8–23)
CO2: 25 mmol/L (ref 22–32)
Calcium: 8.3 mg/dL — ABNORMAL LOW (ref 8.9–10.3)
Chloride: 103 mmol/L (ref 98–111)
Creatinine, Ser: 0.56 mg/dL (ref 0.44–1.00)
GFR, Estimated: >60 mL/min (ref >60)
Glucose, Bld: 126 mg/dL — ABNORMAL HIGH (ref 70–99)
Potassium: 5 mmol/L (ref 3.5–5.1)
Sodium: 138 mmol/L (ref 135–145)

## 2020-05-10 LAB — MAGNESIUM: Magnesium: 2 mg/dL (ref 1.7–2.4)

## 2020-05-10 MED ORDER — ORAL CARE MOUTH RINSE
15.0000 mL | Freq: Two times a day (BID) | OROMUCOSAL | Status: DC
  Administered 2020-05-10: 15 mL via OROMUCOSAL

## 2020-05-10 MED ORDER — MIDAZOLAM HCL 2 MG/2ML IJ SOLN
0.2500 mg | Freq: Once | INTRAMUSCULAR | Status: AC
  Administered 2020-05-10: 0.25 mg via INTRAVENOUS
  Filled 2020-05-10: qty 2

## 2020-05-10 MED ORDER — CHLORHEXIDINE GLUCONATE CLOTH 2 % EX PADS
6.0000 | MEDICATED_PAD | Freq: Every day | CUTANEOUS | Status: DC
  Administered 2020-05-09 – 2020-05-11 (×3): 6 via TOPICAL

## 2020-05-10 NOTE — Discharge Summary (Incomplete)
Physician Discharge Summary         Patient ID: Yolanda Shaw MRN: [id number] DOB/AGE: 08/31/1931 87 y.o.  Admit date: 2020-05-31 Discharge date: 05/10/2020  Discharge Diagnoses:   Acute Onset Respiratory Failure  2/2 suspected foreign body aspiration requiring RSI for  Intubation, Mechanical ventilation  and ICU stay. History of aspiration Empiric Coverage of Aspiration pneumonia Dementia without behavioral disturbance  Hx. Of Anxiety Hx. Of pressure wounds to shoulder and back, present on admission  Hx. Of being bed bound.    Discharge summary   Pt. Has been liberated from mechanical ventilation to 3 L [place] on 05/10/2020. CXR shows a small effusion.  Palliation was consulted to discuss  Goals of care with Daughter Yolanda Shaw.  Family is hopeful that now patient has been  liberated from the vent she  will be able to return home with Hospice care. Pt. Family was encouraged by Palliation  to consider their  choices regarding treating a recurrent pneumonia with antibiotics vs treating patient symptomatically and keeping her comfortable as she heads towards end of life. She will most likely continue to aspirate . For now- Kim elects to treat all reversible illnesses.   Patient  is currently an established patient with [facility]. Plan is to discharge patient home to [facility] care that has already been established prior to admission.     Discharge Plan by Active Problems   Acute hypoxic respiratory failure with concern for foreign body aspiration - bedside bronch with no foreign body noted.  Per family, she does have a hx of aspiration and she could have easily aspirated her food this evening prior to events that occurred (she only eats baby food). Extubated 05/10/2020 Currently on 3 L [place] with adequate saturations. Saturation goal is > 88% Plan to Discharge home with Hospice 5/7 ( Earliest  patient family can arrange for care through Hospice) Hold in ICU overnight until 5/7 and discharge  home - Continue  Zosyn for now.>> Consider transition to Augmentin at discharge - Bronchial hygiene. - Ambulate/ OOB  as able - Follow up per Hospice Care once home  Hx anxiety, dementia. - Continue home Divalproex, Duloxetine, Quetiapine  - Hold home Alprazolam for now   Multiple Pressure Wounds , variable stages  Present on admission in bed bound patient Sacrum Stage 1 L Upper Vertebral Column  Stage 3 Left ischial tuberosity>> unstagable Lower Sacrum Stage 2 Plan Wound Care >> Will need  Wound  care by Hospice once discharged for wounds that were present on admission    Significant Hospital tests/ studies  CXR 05/10/2020 Endotracheal tube remains in position. Suspect small layering right pleural effusion; assessment of the right lung base difficult due to kyphotic AP portable technique and overlying external leads and support apparatus. No new airspace opacity. IMPRESSION: 1. Suspect small layering right pleural effusion; assessment of the right lung base difficult due to kyphotic AP portable technique and overlying external leads and support apparatus. 2.  Endotracheal tube remains in position. 3.  No new airspace opacity. Procedures   2020/05/31>> Intubation 05/31/20>>Bronchoscopy 05/10/2020>> Extubation    Culture data/antimicrobials   May 31, 2020 MRSA by PCR Negative Zosyn 05-31-2020>>    Consults      Discharge Exam: BP (!) 141/87   Pulse 88   Temp 98.1 F (36.7 C) (Axillary)   Resp (!) 23   Ht 5\' 7"  (1.702 m)   Wt 48 kg   SpO2 96%   BMI 16.57 kg/m   General:Pleasant affect, NAD Skin:Warm  and dry, brisk capillary refill HEENT:normocephalic, sclera clear, mucus membranes moist Neck:supple, no JVD, no bruits  Heart:S1S2 RRR without murmur, gallup, rub or click Lungs:clear without rales, rhonchi, or wheezes NKN:LZJQ, non tender, + BS, do not palpate liver spleen or masses Ext:no lower ext edema, 2+ pedal pulses, 2+ radial pulses Neuro:alert and oriented  X 3, MAE, follows commands, + facial symmetry  Labs at discharge   Lab Results  Component Value Date   CREATININE 0.56 05/10/2020   BUN 17 05/10/2020   NA 138 05/10/2020   K 5.0 05/10/2020   CL 103 05/10/2020   CO2 25 05/10/2020   Lab Results  Component Value Date   WBC 12.6 (H) 05/10/2020   HGB 13.2 05/10/2020   HCT 41.2 05/10/2020   MCV 102.5 (H) 05/10/2020   PLT 146 (L) 05/10/2020   Lab Results  Component Value Date   ALT 10 05/14/2020   AST 22 05/19/2020   ALKPHOS 94 05/26/2020   BILITOT 0.7 05/26/2020   Lab Results  Component Value Date   INR 1.2 06/02/2020   INR 1.20 10/20/2016   INR 1.07 10/19/2016    Current radiological studies    DG Chest Port 1 View  Result Date: 05/10/2020 CLINICAL DATA:  Respiratory failure EXAM: PORTABLE CHEST 1 VIEW COMPARISON:  05/18/2020 FINDINGS: Endotracheal tube remains in position. Suspect small layering right pleural effusion; assessment of the right lung base difficult due to kyphotic AP portable technique and overlying external leads and support apparatus. No new airspace opacity. IMPRESSION: 1. Suspect small layering right pleural effusion; assessment of the right lung base difficult due to kyphotic AP portable technique and overlying external leads and support apparatus. 2.  Endotracheal tube remains in position. 3.  No new airspace opacity. Electronically Signed   By: Lauralyn Primes M.D.   On: 05/10/2020 08:15   DG Chest Portable 1 View  Result Date: 06/01/2020 CLINICAL DATA:  Unresponsive intubated EXAM: PORTABLE CHEST 1 VIEW COMPARISON:  12/27/2016, CT 12/28/2012 FINDINGS: Endotracheal tube tip about 1.2 cm superior to the carina. Examination is limited by positioning, patient is rotated and the left upper extremity obscures the left mid to lower chest. No focal consolidation or pleural effusion. No definitive pneumothorax. Chronic fracture deformity of the right humeral neck. Cardiomediastinal silhouette within normal limits with  aortic atherosclerosis. IMPRESSION: 1. Endotracheal tube tip about 1.2 cm superior to carina. 2. Limited exam secondary to positioning and habitus. No gross areas of acute airspace disease Electronically Signed   By: Jasmine Pang M.D.   On: 05/17/2020 19:28    Disposition:  Stable for discharge home with Hospice Care       Allergies as of 05/10/2020      Reactions   Ether Other (See Comments)   Hard to wake up after procedure   Amitriptyline Rash   Penicillins Rash   Has patient had a PCN reaction causing immediate rash, facial/tongue/throat swelling, SOB or lightheadedness with hypotension: No Has patient had a PCN reaction causing severe rash involving mucus membranes or skin necrosis: No Has patient had a PCN reaction that required hospitalization No Has patient had a PCN reaction occurring within the last 10 years: No If all of the above answers are "NO", then may proceed with Cephalosporin use.    Med Rec must be completed prior to using this [facility]***        Follow-up appointment    Discharge Condition:    stable  Physician Statement:   The Patient was  personally examined, the discharge assessment and plan has been personally reviewed and I agree with ACNP Eunice Winecoff's assessment and plan. 35 minutes of time have been dedicated to discharge assessment, planning and discharge instructions.   Signed: Bevelyn Ngo, AGACNP-BC 05/10/2020, 12:58 PM

## 2020-05-10 NOTE — Progress Notes (Addendum)
Consultation Note Date: 05/10/2020   Patient Name: Yolanda Shaw  DOB: July 24, 1931  MRN: [id number]  Age / Sex: 87 y.o., female  PCP: Wenda Low, MD Referring Physician: Julian Hy, DO  Reason for Consultation: Establishing goals of care  HPI/Patient Profile: 87 y.o. female  with past medical history of dementia, multiple hip fractures, bedbound status, on Hospice with [facility], HTN admitted on 05/11/2020 with respiratory distress after choking event. She was intubated in the ED. Bronchoscopy completed with findings of copious thin secretions. Palliative medicine consulted for goals of care.    Clinical Assessment and Goals of Care: Met at the bedside with patient's daughter, Yolanda Shaw. Patient in bed, opens eyes.  Per Yolanda Shaw-  Patient lives at home with her where she has assistance from primary caregivers.  She is retired from working as the Radiation protection practitioner for [employer]. She valued her intellect and independence. Yolanda Shaw shares she enjoys words and wordsmithing. Patient's dementia is very advanced- she is bedbound, she does speak intelligibly, she has upper extremity contractures and rigidity. She eats pureed food and smoothies that [employer] for her.  Per Kim's report she has large pressure wounds on her shoulder and back.  We discussed goals of care and expectations.  Yolanda Shaw is hopeful that patient will be liberated from the vent and would be able to return home with Hospice care.  Patient was orginally enrolled with Hospice for "financial support" with caregiving.  We discussed the Hospice philosophy- caring for patient's at end of life and supporting them through end of life with comfort and dignity.  Yolanda Shaw feels that patient is fighting to hang on, and that she will die when she is ready. Her hope is that patient will just go to sleep and not wake up.  We discussed the trajectory of dementia and how the  patient's status will likely be worse after this insult- as well and the likelihood of ongoing aspiration, and progression of difficulty swallowing. Encouraged Kim to speak with her brother and begin to consider if patient does not do well after extubation- how aggressive her care should be. Kim agrees with provider recommendations that patient should not be reintubated.  I further encouraged her to consider her choices regarding treating a recurrent pneumonia with antibiotics vs treating patient symptomatically and keeping her comfortable as she heads towards end of life.  For now- Kim elects to treat all reversible illnesses.   Primary Decision Maker NEXT OF KIN- daughter Yolanda Shaw and her brother    SUMMARY OF RECOMMENDATIONS -Limited code -One way extubation when CCM feels patient is ready- recommend full DNR after extubation -Hopeful to return home with hospice once stable -PMT will contact [facility] to update them -?wound RN consult for pressure wounds on back and shoulder- will defer to primary team -PMT will follow for continued [place] post extubation- I am concerned she will likely continue to have recurrent aspiration after extubation- encouraged Kim to be prepared for this and to consider how aggressive patient would choose to be  Code Status/Advance Care Planning:  Limited code   Prognosis:    Unable to determine  Discharge Planning: To Be Determined   Review of Systems  Unable to perform ROS: Intubated    Physical Exam Vitals and nursing note reviewed.  Constitutional:      Appearance: She is ill-appearing.     Comments: Frail, cachetic  Musculoskeletal:     Comments: Upper extremity and truncal rigidity  Neurological:     Comments: lethargic     Palliative Assessment/Data: 10%     Thank you for this consult. Palliative medicine will continue to follow and assist as needed.   Time In: 0944 Time Out: 1101 Time Total:77 mins  Greater than 50%  of this time  was spent counseling and coordinating care related to the above assessment and plan.  Signed by: Mariana Kaufman, AGNP-C Palliative Medicine    Please contact Palliative Medicine Team phone at [phone] for questions and concerns.  For individual provider: See Shea Evans

## 2020-05-10 NOTE — Procedures (Signed)
Extubation Procedure Note  Patient Details:   Name: Yolanda Shaw DOB: 1931/12/20 MRN: [id number]   Airway Documentation:    Vent end date: 05/10/20 Vent end time: 1130   Evaluation  O2 sats: stable throughout Complications: No apparent complications Patient did tolerate procedure well. Bilateral Breath Sounds: Clear,Diminished   No   Positive cuff leak noted. Patient placed on [place] 3L with humidity, no stridor noted. Family at bedside.  Rayburn Felt 05/10/2020, 11:38 AM

## 2020-05-10 NOTE — Progress Notes (Signed)
Hospitalized Hospice RN note: [facility] [phone]  Yolanda Shaw is [facility] current hospice patient with a CTI of Alzheimer's Dementia. Family initiated EMS for patient having AMS with possible aspiration and respiratory distress. Patient saturations dropped to the 40s and she was unresponsive in the ED where she had bronchoscopy and intubated. This is related admission per [facility] MD.   Visited patient at bedside where she was responsive to voice. Plan per bedside RN is to extubate and monitor before discharging home. Spoke with daughter Yolanda Shaw who stated she is aware of patients status and plans to D/C home tomorrow if all goes well with PT and Speech. Daughter Yolanda Shaw initially asked for full code but after further discussions, agreed to continued supportive care with mechanical ventilation, using ACLS drugs as needed; however, NO CPR due to the risks of further harm that come with this.  VS: 143/76, HR 103, RR 24, 90% [place] 3L Labs:  Glucose: 126 (H) BUN: 17 Creatinine: 0.56 Calcium: 8.3 (L) WBC: 12.6 (H) MCV: 102.5 (H) Platelets: 146 (L)  Diagnostics: IMPRESSION: 1. Suspect small layering right pleural effusion; assessment of the right lung base difficult due to kyphotic AP portable technique and overlying external leads and support apparatus.  2.  Endotracheal tube remains in position.  3.  No new airspace opacity.   Electronically Signed   By: Lauralyn Primes M.D.   On: 05/10/2020 08:15 Problem List: Acute hypoxic respiratory failure with concern for foreign body aspiration - bedside bronch with no foreign body noted.  Unclear whether positive pressure could have pushed said foreign body into distal bronchi.  Per family, she does have a hx of aspiration and she could have easily aspirated her food this evening prior to events that occurred (she only eats baby food). - Admit to ICU. - Allow sedation and paralytics to wear off then attempt extubation if she meets criteria. - Empiric Zosyn for now. - Send  sputum culture. - Bronchial hygiene.  Hx anxiety, dementia. - Continue home Divalproex, Duloxetine, Quetiapine starting tomorrow. - Hold home Alprazolam.  IV: piperacillin-tazobactam (ZOSYN) IVPB 3.375 g Dose: 3.375 g Freq: Every 8 hours Route: IV  D/C planning: return home with hospice GOC:  clear.  pt partial code: no CPR, no reintubation IDT updated.  Family:  updated daughter Yolanda Shaw on patients status.   Thank you Yolanda Shaw, BSN, [facility] [phone]

## 2020-05-10 NOTE — Progress Notes (Signed)
NAME:  Yolanda Shaw, MRN:  [id number], DOB:  05-Oct-1931, LOS: 1 ADMISSION DATE:  05/20/2020, CONSULTATION DATE:  05/08/2020 REFERRING MD:  [place] CHIEF COMPLAINT:  Hypoxia   History of Present Illness:  Yolanda Shaw is a 87 y.o. F with PMH of Dementia, HTN, multiple fx's of pelvis, anxiety, AKI.  She presented to [facility] ED 5/5 with respiratory distress and hypoxic respiratory failure.  EMS was called to her home for unresponsiveness and upon their arrival, she did have a pulse but required BVM.  Sats were in the 40's.  They attempted intubation but met resistance.  She was brought to ED where she was later intubated.  There was a concern for foreign body aspiration.  PCCM was called for bedside bronchoscopy and admission.  Dr. Carlis Abbott performed bedside bronch but there was no foreign body found.  Plan was to admit her to the ICU and once paralytics from intubation are worn off, goa head with extubation if she meets criteria.  Per discussion with family, pt does have a hx of aspiration and they feel she could have aspirated her baby food this evening since she was fine prior to eating, then suddenly began to have distress.  They also note that she enrolled with hospice in November 2021 due to being bed ridden and multiple medical problems.  Daughter [name] initially asked for full code but after further discussions, agreed to continued supportive care with mechanical ventilation, using ACLS drugs as needed; however, NO CPR due to the risks of further harm that come with this.   Pertinent  Medical History   has a past medical history of AKI (acute kidney injury) ([place]), Anemia, Anxiety, Arthritis, Bilateral pubic rami fractures ([place]) (06/29/2014), Closed fracture of right proximal humerus, Closed stable fracture of multiple pubic rami ([place]) (02/17/2016), Dementia ([place]), Dementia with behavioral disturbance ([place]), Essential hypertension, Fracture of left pubis ([place]), Ischium fracture ([place]), Pelvic fracture ([place])  (06/30/2014), Pressure ulcer (06/30/2014), Thrombocytopenia ([place]) (06/30/2014), and Tremor (06/30/2014).  Significant Hospital Events: Including procedures, antibiotic start and stop dates in addition to other pertinent events   5/5 > admit.  Micro Data: COVID 5/5 >  Sputum 5/5 >  Antibiotics: Zosyn 5/5 >   Interim History / Subjective:  Daughter at bedside, patient sleepy, mildly responsive. At home wakes up 10AM, goes to bed around 6:30pm, naps during the day. Has nursing care at home.  Objective   Blood pressure 132/78, pulse (!) 102, temperature (!) 97.3 F (36.3 C), temperature source Axillary, resp. rate 20, height '5\' 7"'  (1.702 m), weight 48 kg, SpO2 97 %.    Vent Mode: PSV;CPAP FiO2 (%):  [40 %-100 %] 40 % Set Rate:  [16 bmp-18 bmp] 18 bmp Vt Set:  [490 mL] 490 mL PEEP:  [5 cmH20] 5 cmH20 Pressure Support:  [8 cmH20] 8 cmH20 Plateau Pressure:  [20 UVO53-66 cmH20] 21 cmH20   Intake/Output Summary (Last 24 hours) at 05/10/2020 1814 Last data filed at 05/10/2020 1600 Gross per 24 hour  Intake 803.45 ml  Output 400 ml  Net 403.45 ml   Filed Weights   05/20/2020 1845  Weight: 48 kg    Examination: General: frail, cachectic appearing elderly woman lying in bed in NAD Neuro: not on sedation, tracking, but not very interactive during exam. Not gagging on ETT. HEENT: [place]/AT, eyes anicteric, ETT in place Cardiovascular: S1S2, RRR Lungs: synchronous with MV, CTAB Abdomen: soft, NT, ND Musculoskeletal: minimal muscle mass, no edema  Skin: thin skin, wounds not examined.  Resolved Hospital Problem list     Assessment & Plan:   Acute hypoxic respiratory failure due to aspiration while eating. Presented with severe hypoxia. Family wanted intubated and she had been full code despite hospice. Bedside bronch with no foreign body noted. Per family, she does have a hx of aspiration and she could have easily aspirated her food this evening prior to events that occurred (she only eats  baby food). - LTVV -SAT & SBT> passed, extubated to [place]. Discussed with her daughter [name] that it is not beneficial to continue both allowing her to eat and planning to intubate when this happens. This will happen again. She is likely frequently aspirating and I fear that with her frailty she is high risk for poor recovery despite maximal care. MV is likely causing suffering rather than benefit. -Stop antibiotics given normal CXR. -Per daughter's request, SLP consult. I discussed with her hospice team that I am concerned there is going to be no good solution to this problem.  -Palliative Care consult- I agree with their recommendations.  Hx anxiety, dementia. - Continue home Divalproex, Duloxetine, Quetiapine. - Hold home Alprazolam.    Best practice (right click and "Reselect all SmartList Selections" daily)  Diet:  NPO Pain/Anxiety/Delirium protocol (if indicated): No VAP protocol (if indicated): Not indicated DVT prophylaxis: Subcutaneous Heparin GI prophylaxis: N/A and PPI Glucose control:  SSI No Central venous access:  N/A Arterial line:  N/A Foley:  N/A Mobility:  OOB  PT consulted: N/A Last date of multidisciplinary goals of care discussion: daughter updated today at bedside Code Status:  limited - NO CPR. Disposition: ICU  Labs   CBC: Recent Labs  Lab 05/15/2020 2003 06/02/2020 2123 05/27/2020 2321 05/10/20 0212  WBC  --  5.4  --  12.6*  NEUTROABS  --  4.5  --   --   HGB 12.6 13.2 12.2 13.2  HCT 37.0 42.3 36.0 41.2  MCV  --  105.0*  --  102.5*  PLT  --  PLATELET CLUMPS NOTED ON SMEAR, UNABLE TO ESTIMATE  --  146*    Basic Metabolic Panel: Recent Labs  Lab 05/26/2020 2003 05/19/2020 2123 05/08/2020 2321 05/10/20 0212  NA 140 138 137 138  K 3.4* 4.7 4.6 5.0  CL  --  104  --  103  CO2  --  22  --  25  GLUCOSE  --  142*  --  126*  BUN  --  16  --  17  CREATININE  --  0.55  --  0.56  CALCIUM  --  8.0*  --  8.3*  MG  --   --   --  2.0  PHOS  --   --   --  3.5      This patient is critically ill with multiple organ system failure which requires frequent high complexity decision making, assessment, support, evaluation, and titration of therapies. This was completed through the application of advanced monitoring technologies and extensive interpretation of multiple databases. During this encounter critical care time was devoted to patient care services described in this note for 36 minutes.   Julian Hy, DO 05/10/20 6:24 PM [facility]

## 2020-05-10 NOTE — TOC Initial Note (Signed)
Transition of Care [facility]) - Initial/Assessment Note    Patient Details  Name: Yolanda Shaw MRN: [id number] Date of Birth: [birth year]  Transition of Care [facility]) CM/SW Contact:    Bess Kinds, RN Phone Number: [phone] 05/10/2020, 12:52 PM  Clinical Narrative:                  Notified by MD of patient readiness to transition home with hospice care. Spoke with patient's daughter, Selena Batten. Verified patient active with [facility] hospice. Has private caregivers. Caregivers are not available for today, but can resume regular schedule tomorrow at 10 am. Reached out to [facility] to advise - pending f/u. Patient will need ambulance hospice transport through [facility]. Daughter advised that patient is not a DNR at this time. TOC following for transition needs.   Expected Discharge Plan: Home w Hospice Care Barriers to Discharge: Other (comment) (caregiver arrangements for tomorrow)   Patient Goals and CMS Choice Patient states their goals for this hospitalization and ongoing recovery are:: return home with [facility] hospice CMS Medicare.gov Compare Post Acute Care list provided to:: Patient Represenative (must comment) Choice offered to / list presented to : NA  Expected Discharge Plan and Services Expected Discharge Plan: Home w Hospice Care In-house Referral: Hospice / Palliative Care Discharge Planning Services: CM Consult Post Acute Care Choice: NA Living arrangements for the past 2 months: Single Family Home                 DME Arranged: N/A DME Agency: NA       HH Arranged: NA HH Agency: NA        Prior Living Arrangements/Services Living arrangements for the past 2 months: Single Family Home            Need for Family Participation in Patient Care: Yes (Comment) Care giver support system in place?: Yes (comment) Current home services: DME Criminal Activity/Legal Involvement Pertinent to Current Situation/Hospitalization: No - Comment as needed  Activities of Daily  Living      Permission Sought/Granted                  Emotional Assessment              Admission diagnosis:  Respiratory failure with hypoxia (HCC) [J96.91] Patient Active Problem List   Diagnosis Date Noted  . Encounter for postanesthesia care 05/10/2020  . Respiratory failure with hypoxia (HCC) 06/04/2020  . Acute encephalopathy   . Aspiration pneumonia (HCC)   . Acute blood loss anemia   . Closed left hip fracture, initial encounter (HCC) 10/19/2016  . Hip fracture (HCC) 10/19/2016  . Anemia 03/02/2016  . Closed stable fracture of multiple pubic rami (HCC) 02/17/2016  . Closed fracture of right proximal humerus   . Essential hypertension   . AKI (acute kidney injury) (HCC)   . Dementia without behavioral disturbance (HCC)   . Pelvic fracture (HCC) 06/30/2014  . Thrombocytopenia (HCC) 06/30/2014  . Tremor 06/30/2014  . Pressure ulcer 06/30/2014  . Fracture of left pubis (HCC)   . Ischium fracture (HCC)   . Left hip pain   . Bilateral pubic rami fractures (HCC) 06/29/2014  . Atrial fibrillation (HCC) 03/02/2014   PCP:  Georgann Housekeeper, MD Pharmacy:   [facility] - [place], [place] - [address] [place] [address] Phone: [phone] Fax: [phone]  [facility] - [place] ([place]), [place] - [address] [place] ([place]) [place] [address]  Phone: [phone] Fax: [phone]     Social Determinants of Health (SDOH) Interventions    Readmission Risk Interventions No flowsheet data found.

## 2020-05-10 NOTE — Progress Notes (Signed)
eLink Physician-Brief Progress Note Patient Name: Dorrine Montone DOB: [birth year] MRN: [id number]   Date of Service  05/10/2020  HPI/Events of Note  Anxiety - Patient is on Xanax at home, however, she is NPO at this time.   eICU Interventions  Plan: 1. Versed 0.25 mg IV X 1 now.      Intervention Category Major Interventions: Delirium, psychosis, severe agitation - evaluation and management  Sevastian Witczak [place] 05/10/2020, 10:30 PM

## 2020-05-10 NOTE — Consult Note (Signed)
WOC Nurse Consult Note: Patient receiving care in [phone] Probable discharge back home with hospice Reason for Consult: Spine and sacral wounds Wound type: See flow sheet Pressure Injury POA: Yes Measurement: See flow chart Dressing procedure/placement/frequency: Clean sacral and spinal wounds with NS, pat dry. Apply moistened saline gauze, cover with 4 x 4 or abd pads and secure with medipore tape. Change twice daily.  Monitor the wound area(s) for worsening of condition such as: Signs/symptoms of infection, increase in size, development of or worsening of odor, development of pain, or increased pain at the affected locations.   Notify the medical team if any of these develop.  Thank you for the consult. WOC nurse will not follow at this time.   Please re-consult the WOC team if needed.  Renaldo Reel Katrinka Blazing, MSN, RN, CMSRN, Angus Seller, [facility] Wound Treatment Associate Pager [phone]

## 2020-05-11 DIAGNOSIS — Z7189 Other specified counseling: Secondary | ICD-10-CM | POA: Diagnosis not present

## 2020-05-11 DIAGNOSIS — F0391 Unspecified dementia with behavioral disturbance: Secondary | ICD-10-CM

## 2020-05-11 DIAGNOSIS — J9601 Acute respiratory failure with hypoxia: Secondary | ICD-10-CM | POA: Diagnosis not present

## 2020-05-11 MED ORDER — ALPRAZOLAM 0.25 MG PO TABS
0.2500 mg | ORAL_TABLET | Freq: Three times a day (TID) | ORAL | Status: DC | PRN
  Administered 2020-05-11: 0.25 mg via ORAL
  Filled 2020-05-11: qty 1

## 2020-05-11 MED ORDER — DILTIAZEM HCL-DEXTROSE 125-5 MG/125ML-% IV SOLN (PREMIX)
5.0000 mg/h | INTRAVENOUS | Status: DC
  Filled 2020-05-11: qty 125

## 2020-05-11 MED ORDER — CHLORHEXIDINE GLUCONATE 0.12 % MT SOLN
15.0000 mL | Freq: Two times a day (BID) | OROMUCOSAL | Status: DC
  Administered 2020-05-11: 15 mL via OROMUCOSAL
  Filled 2020-05-11 (×2): qty 15

## 2020-05-11 MED ORDER — HALOPERIDOL LACTATE 5 MG/ML IJ SOLN
0.5000 mg | Freq: Three times a day (TID) | INTRAMUSCULAR | Status: DC
  Administered 2020-05-11 – 2020-05-12 (×5): 0.5 mg via INTRAVENOUS
  Filled 2020-05-11 (×5): qty 1

## 2020-05-11 MED ORDER — DIAZEPAM 5 MG/ML IJ SOLN
2.5000 mg | Freq: Three times a day (TID) | INTRAMUSCULAR | Status: DC
  Administered 2020-05-11 – 2020-05-12 (×5): 2.5 mg via INTRAVENOUS
  Filled 2020-05-11 (×5): qty 2

## 2020-05-11 MED ORDER — HEPARIN (PORCINE) 25000 UT/250ML-% IV SOLN
650.0000 [IU]/h | INTRAVENOUS | Status: DC
  Administered 2020-05-11: 650 [IU]/h via INTRAVENOUS
  Filled 2020-05-11: qty 250

## 2020-05-11 MED ORDER — FOOD THICKENER (SIMPLYTHICK HONEY): 1.0000 | ORAL | Status: DC | PRN

## 2020-05-11 MED ORDER — FENTANYL CITRATE (PF) 100 MCG/2ML IJ SOLN: 12.5000 ug | INTRAMUSCULAR | Status: DC | PRN

## 2020-05-11 MED ORDER — ORAL CARE MOUTH RINSE: 15.0000 mL | Freq: Two times a day (BID) | OROMUCOSAL | Status: DC

## 2020-05-11 MED ORDER — AMIODARONE HCL IN DEXTROSE 360-4.14 MG/200ML-% IV SOLN
30.0000 mg/h | INTRAVENOUS | Status: DC
  Administered 2020-05-12 (×2): 30 mg/h via INTRAVENOUS
  Filled 2020-05-11: qty 200

## 2020-05-11 MED ORDER — AMIODARONE LOAD VIA INFUSION
150.0000 mg | Freq: Once | INTRAVENOUS | Status: AC
  Administered 2020-05-11: 150 mg via INTRAVENOUS
  Filled 2020-05-11: qty 83.34

## 2020-05-11 MED ORDER — AMIODARONE HCL IN DEXTROSE 360-4.14 MG/200ML-% IV SOLN
60.0000 mg/h | INTRAVENOUS | Status: AC
  Administered 2020-05-11 – 2020-05-12 (×2): 60 mg/h via INTRAVENOUS
  Filled 2020-05-11: qty 400

## 2020-05-11 MED ORDER — VALPROATE SODIUM 100 MG/ML IV SOLN
125.0000 mg | Freq: Two times a day (BID) | INTRAVENOUS | Status: DC
  Administered 2020-05-11 – 2020-05-12 (×3): 125 mg via INTRAVENOUS
  Filled 2020-05-11 (×4): qty 1.25

## 2020-05-11 NOTE — Progress Notes (Signed)
eLink Physician-Brief Progress Note Patient Name: Yolanda Shaw DOB: 1931-09-09 MRN: [id number]   Date of Service  05/11/2020  HPI/Events of Note  AFIB with RVR - Ventricular rate = 140-150. BP = 99/69 with MAP = 79  eICU Interventions  Plan: 1. Amiodarone IV load and infusion. 2. BMP and Mg++ level STAT. 3. Heparin IV infusion per pharmacy consult. 4. Cycle Troponin.     Intervention Category Major Interventions: Arrhythmia - evaluation and management  Jayziah Bankhead Eugene 05/11/2020, 10:30 PM

## 2020-05-11 NOTE — Progress Notes (Signed)
NAME:  Yolanda Shaw, MRN:  [id number], DOB:  1931/12/06, LOS: 2 ADMISSION DATE:  05/08/2020, CONSULTATION DATE:  05/10/2020 REFERRING MD:  Tegeler CHIEF COMPLAINT:  Hypoxia   History of Present Illness:  Yolanda Shaw is a 87 y.o. F with PMH of Dementia, HTN, multiple fx's of pelvis, anxiety, AKI.  She presented to [facility] ED 5/5 with respiratory distress and hypoxic respiratory failure.  EMS was called to her home for unresponsiveness and upon their arrival, she did have a pulse but required BVM.  Sats were in the 40's.  They attempted intubation but met resistance.  She was brought to ED where she was later intubated.  There was a concern for foreign body aspiration.  PCCM was called for bedside bronchoscopy and admission.  Dr. Carlis Abbott performed bedside bronch but there was no foreign body found.  Plan was to admit her to the ICU and once paralytics from intubation are worn off, goa head with extubation if she meets criteria.  Per discussion with family, pt does have a hx of aspiration and they feel she could have aspirated her baby food this evening since she was fine prior to eating, then suddenly began to have distress.  They also note that she enrolled with hospice in November 2021 due to being bed ridden and multiple medical problems.  Daughter Yolanda Shaw initially asked for full code but after further discussions, agreed to continued supportive care with mechanical ventilation, using ACLS drugs as needed; however, NO CPR due to the risks of further harm that come with this.   Pertinent  Medical History   has a past medical history of AKI (acute kidney injury) ([place]), Anemia, Anxiety, Arthritis, Bilateral pubic rami fractures ([place]) (06/29/2014), Closed fracture of right proximal humerus, Closed stable fracture of multiple pubic rami ([place]) (02/17/2016), Dementia ([place]), Dementia with behavioral disturbance ([place]), Essential hypertension, Fracture of left pubis ([place]), Ischium fracture ([place]), Pelvic fracture ([place])  (06/30/2014), Pressure ulcer (06/30/2014), Thrombocytopenia ([place]) (06/30/2014), and Tremor (06/30/2014).  Significant Hospital Events: Including procedures, antibiotic start and stop dates in addition to other pertinent events   5/5 > admit  Micro Data: COVID 5/5 >  Sputum 5/5 >  Antibiotics: Zosyn 5/5 >   Interim History / Subjective:  Agitated overnight, received IV benzos. Swallow study this morning- passed with recommendation for dysphagia 1, thickened liquids. She coughed and desaturated with taking pills in pudding after, now requiring NRB. Her daughter Yolanda Shaw is at bedside.  Objective   Blood pressure (!) 156/81, pulse 96, temperature (!) 97.3 F (36.3 C), temperature source Axillary, resp. rate (!) 42, height '5\' 7"'  (1.702 m), weight 48 kg, SpO2 90 %.        Intake/Output Summary (Last 24 hours) at 05/11/2020 1402 Last data filed at 05/11/2020 1100 Gross per 24 hour  Intake 1044.67 ml  Output 250 ml  Net 794.67 ml   Filed Weights   05/22/2020 1845  Weight: 48 kg    Examination:   General: frail, cachectic appearing elderly woman lying in bed in NAD Neuro: awake, but not very interactive. Agitated once on NRB. Speaking more to her daughter. Very weak.  HEENT: temporal wasting, otherwise normocephalic Cardiovascular: O1B5, RRR Lungs: wet sounding voice, no throat clearing. Tachypneic, no accessory muscle use. Saturating in 70-80s on [place]>> NRB. Some rhonchi. Abdomen: soft, ND Musculoskeletal: minimal muscle mass, no edema.  Skin: thin skin, bed sores not examined.  Resolved Hospital Problem list     Assessment & Plan:   Acute hypoxic respiratory  failure due to aspiration while eating. Recurrent aspiration episode this morning with poor sensation in her throat- wasn't gagging on ETT and isn't trying to clear her throat when she has retained secretions on exam.  Life threating respiratory failure, requiring NRB to recover saturations.  - SLP evaluation, back to NPO now. All meds  switched to IV. -Palliative Care consult- I agree with their recommendations.  Hx anxiety, dementia with behavioral disturbances. - Home meds transitioned to IV- depakote, seroquel switched to haldol, Xanax to ativan. Unfortunately no substitute for duloxetine.  Decubitus ulcers, present at admission -wet to dry dressings  Failure to thrive, severe protein energy malnutrition -Limited options with advanced dementia. I discussed with her daughter why a PEG is not indicated.  GoC -I had a lengthy discussion with Yolanda Shaw about my concerns for future aspiration episodes and her mother's frailty. [place] MOST form signed. Not planning on reintubating this admission. We will plan on weaning down her oxygen today and her going home tomorrow with oxygen. She is going to have a meeting with her siblings regarding their plans for Yolanda Shaw's care.   Best practice (right click and "Reselect all SmartList Selections" daily)  Diet:  NPO Pain/Anxiety/Delirium protocol (if indicated): No VAP protocol (if indicated): Not indicated DVT prophylaxis: Subcutaneous Heparin GI prophylaxis: N/A Glucose control:  SSI No Central venous access:  N/A Arterial line:  N/A Foley:  N/A Mobility:  OOB  PT consulted: N/A Last date of multidisciplinary goals of care discussion: daughter updated 5/7 at bedside Code Status:  DNR , [place] MOST signed 5/7- daughter has copy Disposition: ICU  Labs   CBC: Recent Labs  Lab 05/23/2020 2003 05/21/2020 2123 05/08/2020 2321 05/10/20 0212  WBC  --  5.4  --  12.6*  NEUTROABS  --  4.5  --   --   HGB 12.6 13.2 12.2 13.2  HCT 37.0 42.3 36.0 41.2  MCV  --  105.0*  --  102.5*  PLT  --  PLATELET CLUMPS NOTED ON SMEAR, UNABLE TO ESTIMATE  --  146*    Basic Metabolic Panel: Recent Labs  Lab 05/26/2020 2003 05/21/2020 2123 05/08/2020 2321 05/10/20 0212  NA 140 138 137 138  K 3.4* 4.7 4.6 5.0  CL  --  104  --  103  CO2  --  22  --  25  GLUCOSE  --  142*  --  126*  BUN  --  16  --  17   CREATININE  --  0.55  --  0.56  CALCIUM  --  8.0*  --  8.3*  MG  --   --   --  2.0  PHOS  --   --   --  3.5    This patient is critically ill with multiple organ system failure which requires frequent high complexity decision making, assessment, support, evaluation, and titration of therapies. This was completed through the application of advanced monitoring technologies and extensive interpretation of multiple databases. During this encounter critical care time was devoted to patient care services described in this note for 44 minutes.   Julian Hy, DO 05/11/20 2:02 PM [facility]

## 2020-05-11 NOTE — Progress Notes (Signed)
Had severe desaturation episode after taking crushed pills with pudding. Despite escalating oxygen flow rate, she progressively dropped into the 70s, requiring NRB. She remains in the mid to upper 80s on NRB, still with a wet voice. Her daughter is at bedside and we discussed goals of care and the options moving forward. Short term she does not want her reintubated, but if she got home and had a good quality of life for several months, she would consider reintubation if this seemed like an isolated event. She understand my concern that this won't be. We discussed switching all meds that we can off pills to SL or dissolving meds or patches. Here we can use IV. She understands she cannot go home on NRB. She would like to delay discharge to tomorrow. We reviewed and signed the [place] MOST form- this had previously ben reviewed with her by her hospice RN. She is adamant she does not want her mom to go through CPR. RN present thgouhout discussion  DNR order updated in epic.   Steffanie Dunn, DO 05/11/20 1:05 PM [facility]

## 2020-05-11 NOTE — Evaluation (Signed)
Clinical/Bedside Swallow Evaluation Patient Details  Name: Yolanda Shaw MRN: [id number] Date of Birth: September 11, 1931  Today's Date: 05/11/2020 Time: SLP Start Time (ACUTE ONLY): 1025 SLP Stop Time (ACUTE ONLY): 1055 SLP Time Calculation (min) (ACUTE ONLY): 30 min  Past Medical History:  Past Medical History:  Diagnosis Date  . AKI (acute kidney injury) (HCC)   . Anemia    low iron  . Anxiety    panic attacks  . Arthritis   . Bilateral pubic rami fractures (HCC) 06/29/2014  . Closed fracture of right proximal humerus   . Closed stable fracture of multiple pubic rami (HCC) 02/17/2016  . Dementia (HCC)   . Dementia with behavioral disturbance (HCC)   . Essential hypertension   . Fracture of left pubis (HCC)   . Ischium fracture (HCC)   . Pelvic fracture (HCC) 06/30/2014  . Pressure ulcer 06/30/2014  . Thrombocytopenia (HCC) 06/30/2014  . Tremor 06/30/2014   Past Surgical History:  Past Surgical History:  Procedure Laterality Date  . ADENOIDECTOMY    . INTRAMEDULLARY (IM) NAIL INTERTROCHANTERIC Left 10/19/2016   Procedure: INTRAMEDULLARY (IM) NAIL INTERTROCHANTRIC;  Surgeon: Cammy Copa, MD;  Location: [facility] OR;  Service: Orthopedics;  Laterality: Left;  . SHOULDER SURGERY     right  . TONSILLECTOMY    . WRIST SURGERY     HPI:  Yolanda Shaw is a 87 y.o. female with a hx of Alzheimer's Dementia. who presents with respiratory failure, altered mental status, and concern for aspiration.  Pt was intubated from 5/5-5/6.  There was a concern for foreign body aspiration; however when bronchoscopy was performed there was no foreign body found.   Assessment / Plan / Recommendation Clinical Impression  Pt was seen for a bedside swallow evaluation following possible aspiration event and brief intubation.  Pt presents with a cognitive-based oral dysphagia with a possible pharyngeal dysphagia.  Daughter was present at bedside and was able to provide the pt's hx.  Daughter stated that pt  typically consumes purees, smoothies, and thickened liquids at home (she was unsure as to the level of thickness).  Thickened liquids were secondary to the pt's texture preference and to increase oral control rather than secondary to a pharyngeal dysphagia.  Pt has not had a swallow evaluation in the past per daughter report.  Unable to perform oral mechanism examination secondary to pt's decreased ability to follow commands.  Pt was seen with minimal trials of puree, thin liquid, and honey-thick liquid.  Liquids were presented via siphoned straw sip secondary to pt refusing spoon sips and being unable to draw liquid from the straw independently during this evaluation.  Suspect prolonged AP transport of puree and honey-thick liquid, with two swallows observed per puree bolus.  Suspect that multiple swallows were secondary to pharyngeal residue.  No overt s/sx of aspiration were observed with minimal PO trials; however, pt is at an increased risk for aspiration secondary to cognitive deficits.  Of note, pt was observed to have mildly congested inspirations at baseline in the absence of PO.  PO trials were not noted to increase congestion. Recommend initiation of dysphagia 1 (puree) solids and honey-thick liquids (per family preference) with medications administered crushed in puree.  SLP Visit Diagnosis: Dysphagia, unspecified (R13.10)    Aspiration Risk  Moderate aspiration risk;Mild aspiration risk    Diet Recommendation Dysphagia 1 (Puree);Honey-thick liquid   Liquid Administration via: Spoon;Cup;Straw Medication Administration: Crushed with puree Supervision: Full supervision/cueing for compensatory strategies;Staff to assist with self feeding Compensations: Minimize  environmental distractions;Slow rate;Small sips/bites    Other  Recommendations Oral Care Recommendations: Oral care BID;Staff/trained caregiver to provide oral care Other Recommendations: Order thickener from pharmacy   Follow up  Recommendations 24 hour supervision/assistance      Frequency and Duration min 2x/week  2 weeks       Prognosis Prognosis for Safe Diet Advancement: Guarded Barriers to Reach Goals: Cognitive deficits      Swallow Study   General HPI: Yolanda Shaw is a 87 y.o. female with a hx of Alzheimer's Dementia. who presents with respiratory failure, altered mental status, and concern for aspiration.  Pt was intubated from 5/5-5/6.  There was a concern for foreign body aspiration; however when bronchoscopy was perfomed there was no foreign body found. Type of Study: Bedside Swallow Evaluation Previous Swallow Assessment: None Diet Prior to this Study: NPO Temperature Spikes Noted: No Respiratory Status: Nasal cannula History of Recent Intubation: Yes Length of Intubations (days): 1 days Date extubated: 05/10/20 Behavior/Cognition: Alert;Cooperative;Confused;Agitated Oral Cavity Assessment: Within Functional Limits Oral Care Completed by SLP: No Oral Cavity - Dentition: Adequate natural dentition Self-Feeding Abilities: Needs assist Patient Positioning: Upright in bed Baseline Vocal Quality: Low vocal intensity Volitional Cough: Cognitively unable to elicit Volitional Swallow: Unable to elicit    Oral/Motor/Sensory Function Overall Oral Motor/Sensory Function: Other (comment) (Unable to assess secondary to decreased ability to follow commands)   Ice Chips Ice chips: Not tested   Thin Liquid Thin Liquid: Within functional limits Presentation: Straw    Nectar Thick Nectar Thick Liquid: Not tested   Honey Thick Honey Thick Liquid: Within functional limits Presentation: Straw   Puree Puree: Impaired Presentation: Spoon Oral Phase Functional Implications: Prolonged oral transit Pharyngeal Phase Impairments: Multiple swallows   Solid     Solid: Not tested     Villa Herb M.S., CCC-SLP Acute Rehabilitation Services Office: [phone]  Shanon Rosser Charyl Minervini 05/11/2020,11:28 AM

## 2020-05-11 NOTE — Progress Notes (Addendum)
[facility] 3M09 [facility]) Hospitalized Hospice Liaison note  Yolanda Shaw is [facility] current hospice patient with a terminal diagnosis of Alzheimer's Dementia. Family initiated EMS for patient having AMS with possible aspiration and respiratory distress. Patient saturations dropped to the 40s and she was unresponsive in the ED where she had bronchoscopy and intubated. This is related admission per [facility] MD.   Visited patient and daughter Yolanda Shaw at bedside. Pt minimally responsive, SpO2 88-89% on NRB, RR 26-28, [id number].  No acute distress noted.  Yolanda Shaw verbalizes desire to giver her mother every chance, reports understanding that she is declining but shares hope that she can become more alert so that she can eat when she returns home.  Active listening provided.  Pt remains partial code inpatient but has [place] DNR form at bedside for time of discharge. Exchanged report with bedside RN.  Pt remains appropriate for inpatient status due to need for ongoing IV therapies.  VS: 143/71 (91), HR 105, RR 28, 88% 10L NRB Labs:none new Diagnostics: none new Problem List: Acute hypoxic respiratory failure due to aspiration while eating. Recurrent aspiration episode this morning with poor sensation in her throat- wasn't gagging on ETT and isn't trying to clear her throat when she has retained secretions on exam.  Life threating respiratory failure, requiring NRB to recover saturations.  - SLP evaluation, back to NPO now. All meds switched to IV. -Palliative Care consult- I agree with their recommendations.  Hx anxiety, dementia with behavioral disturbances. - Home meds transitioned to IV- depakote, seroquel switched to haldol, Xanax to ativan. Unfortunately no substitute for duloxetine.  Decubitus ulcers, present at admission -wet to dry dressings  Failure to thrive, severe protein energy malnutrition -Limited options with advanced dementia. I discussed with her daughter why a PEG is not  indicated.   IV:LR@50mL /hr continuous, diazepam 2.5 mg PIV q8h, haldol 0.5mg  PIV q8h, valproate 125 mg PIV BID   D/C planning:return home with hospice GOC: clear. pt partial code: no CPR, no reintubation IDT updated.  Family: updated daughter Yolanda Shaw on patients status by phone and at bedside.   Thank you for the opportunity to participate in this patient's care.     Gillian Scarce, BSN, RN [facility] Liaison [phone] (24h on call)

## 2020-05-11 NOTE — TOC Progression Note (Signed)
Transition of Care [facility]) - Progression Note    Patient Details  Name: Yolanda Shaw MRN: [id number] Date of Birth: 01-Aug-1931  [facility] CM/SW Contact  Marlena Barbato, Johnnette Litter, [place] Phone Number: 05/11/2020, 2:15 PM  Clinical Narrative: [facility] team for discharge planning. Saw patient at bedside. She is resting with eyes closed. Daughter- Lalla Brothers at bedside. Spoke to her about discharge plan for home with hospice services- [facility] and to be transported via [facility]. Daughter verbalizes understanding. She asks that she is informed of when EMS is called so she can coordinate caregiver to be in the home to receive the patient. Collaborated with nurse Vernona Rieger and Dr. Chestine Spore about discharge plan. Was informed patient is on O2 and will require O2 for transport to home. Spoke again with daughter. She called hospice coordinator- Chrislyn regarding O2. TOC CM also spoke with Chrislyn to confirm O2 will be delivered to home and to bedside for transport. Paperwork for transport was provided to the Secondary school teacher on the unit. After further collaboration with the medical staff and changes in the patient's condition today's discharge was postponed until tomorrow. Will continue to monitor for discharge planning.   Expected Discharge Plan: Home w Hospice Care Barriers to Discharge: Other (comment) (caregiver arrangements for tomorrow)  Expected Discharge Plan and Services Expected Discharge Plan: Home w Hospice Care In-house Referral: Hospice / Palliative Care Discharge Planning Services: CM Consult Post Acute Care Choice: NA Living arrangements for the past 2 months: Single Family Home                 DME Arranged: N/A DME Agency: NA       HH Arranged: NA HH Agency: NA         Social Determinants of Health (SDOH) Interventions    Readmission Risk Interventions No flowsheet data found.

## 2020-05-11 NOTE — Progress Notes (Signed)
ANTICOAGULATION CONSULT NOTE  Pharmacy Consult for heparin Indication: atrial fibrillation  Allergies  Allergen Reactions  . Ether Other (See Comments)    Hard to wake up after procedure  . Amitriptyline Rash  . Penicillins Rash    Has patient had a PCN reaction causing immediate rash, facial/tongue/throat swelling, SOB or lightheadedness with hypotension: No Has patient had a PCN reaction causing severe rash involving mucus membranes or skin necrosis: No Has patient had a PCN reaction that required hospitalization No Has patient had a PCN reaction occurring within the last 10 years: No If all of the above answers are "NO", then may proceed with Cephalosporin use.    Patient Measurements: Height: 5\' 7"  (170.2 cm) Weight: 48 kg (105 lb 13.1 oz) IBW/kg (Calculated) : 61.6 Heparin Dosing Weight: 48kg  Vital Signs: Temp: 99.3 F (37.4 C) (05/07 1930) Temp Source: Axillary (05/07 1930) BP: 104/62 (05/07 2200) Pulse Rate: 123 (05/07 2200)  Labs: Recent Labs    05-25-2020 2123 05/25/2020 2321 05/10/20 0212  HGB 13.2 12.2 13.2  HCT 42.3 36.0 41.2  PLT PLATELET CLUMPS NOTED ON SMEAR, UNABLE TO ESTIMATE  --  146*  LABPROT 15.0  --   --   INR 1.2  --   --   CREATININE 0.55  --  0.56    Estimated Creatinine Clearance: 38.3 mL/min (by C-G formula based on SCr of 0.56 mg/dL).   Medical History: Past Medical History:  Diagnosis Date  . AKI (acute kidney injury) (HCC)   . Anemia    low iron  . Anxiety    panic attacks  . Arthritis   . Bilateral pubic rami fractures (HCC) 06/29/2014  . Closed fracture of right proximal humerus   . Closed stable fracture of multiple pubic rami (HCC) 02/17/2016  . Dementia (HCC)   . Dementia with behavioral disturbance (HCC)   . Essential hypertension   . Fracture of left pubis (HCC)   . Ischium fracture (HCC)   . Pelvic fracture (HCC) 06/30/2014  . Pressure ulcer 06/30/2014  . Thrombocytopenia (HCC) 06/30/2014  . Tremor 06/30/2014      Assessment: 67 yoF with AFib, pharmacy to start heparin drip. SQ heparin given recently so will defer bolus, CBC yesterday wnl.  Goal of Therapy:  Heparin level 0.3-0.7 units/ml Monitor platelets by anticoagulation protocol: Yes   Plan:  Heparin 650 units/h Check heparin level in 8h CBC daily   88, PharmD, BCPS, [facility] Clinical Pharmacist [phone] Please check AMION for all [facility] Pharmacy numbers 05/11/2020

## 2020-05-12 ENCOUNTER — Inpatient Hospital Stay (HOSPITAL_COMMUNITY)

## 2020-05-12 DIAGNOSIS — F0391 Unspecified dementia with behavioral disturbance: Secondary | ICD-10-CM | POA: Diagnosis not present

## 2020-05-12 DIAGNOSIS — J9601 Acute respiratory failure with hypoxia: Secondary | ICD-10-CM | POA: Diagnosis not present

## 2020-05-12 DIAGNOSIS — L8990 Pressure ulcer of unspecified site, unspecified stage: Secondary | ICD-10-CM | POA: Insufficient documentation

## 2020-05-12 LAB — CBC
HCT: 37.4 % (ref 36.0–46.0)
Hemoglobin: 12.3 g/dL (ref 12.0–15.0)
MCH: 32.7 pg (ref 26.0–34.0)
MCHC: 32.9 g/dL (ref 30.0–36.0)
MCV: 99.5 fL (ref 80.0–100.0)
Platelets: 185 10*3/uL (ref 150–400)
RBC: 3.76 MIL/uL — ABNORMAL LOW (ref 3.87–5.11)
RDW: 15.8 % — ABNORMAL HIGH (ref 11.5–15.5)
WBC: 9.5 10*3/uL (ref 4.0–10.5)
nRBC: 0 % (ref 0.0–0.2)

## 2020-05-12 LAB — MAGNESIUM: Magnesium: 2 mg/dL (ref 1.7–2.4)

## 2020-05-12 LAB — BASIC METABOLIC PANEL
Anion gap: 11 (ref 5–15)
BUN: 20 mg/dL (ref 8–23)
CO2: 21 mmol/L — ABNORMAL LOW (ref 22–32)
Calcium: 8.5 mg/dL — ABNORMAL LOW (ref 8.9–10.3)
Chloride: 104 mmol/L (ref 98–111)
Creatinine, Ser: 0.64 mg/dL (ref 0.44–1.00)
GFR, Estimated: >60 mL/min (ref >60)
Glucose, Bld: 101 mg/dL — ABNORMAL HIGH (ref 70–99)
Potassium: 4.4 mmol/L (ref 3.5–5.1)
Sodium: 136 mmol/L (ref 135–145)

## 2020-05-12 LAB — HEPARIN LEVEL (UNFRACTIONATED): Heparin Unfractionated: 0.74 IU/mL — ABNORMAL HIGH (ref 0.30–0.70)

## 2020-05-12 LAB — TROPONIN I (HIGH SENSITIVITY)
Troponin I (High Sensitivity): 36 ng/L — ABNORMAL HIGH (ref <18)
Troponin I (High Sensitivity): 39 ng/L — ABNORMAL HIGH (ref <18)

## 2020-05-12 MED ORDER — FENTANYL CITRATE (PF) 100 MCG/2ML IJ SOLN: 12.5000 ug | INTRAMUSCULAR | Status: DC | PRN

## 2020-05-12 MED ORDER — LORAZEPAM 2 MG/ML IJ SOLN
1.0000 mg | INTRAMUSCULAR | Status: DC | PRN
  Administered 2020-05-12: 1 mg via INTRAVENOUS
  Filled 2020-05-12: qty 1

## 2020-05-12 MED ORDER — SODIUM CHLORIDE 0.9 % IV BOLUS
500.0000 mL | Freq: Once | INTRAVENOUS | Status: AC
  Administered 2020-05-12: 500 mL via INTRAVENOUS

## 2020-05-12 MED ORDER — SODIUM CHLORIDE 0.9 % IV SOLN: INTRAVENOUS | Status: DC

## 2020-05-12 MED ORDER — LORAZEPAM 2 MG/ML IJ SOLN: 1.0000 mg | INTRAMUSCULAR | Status: DC | PRN

## 2020-05-12 NOTE — Progress Notes (Signed)
Pt placed on HHFNC  and she is tolerating it well at this time. O2 sats still in the upper 86-88 with NRB.

## 2020-05-12 NOTE — Progress Notes (Signed)
eLink Physician-Brief Progress Note Patient Name: Yolanda Shaw DOB: [birth year] MRN: [id number]   Date of Service  05/12/2020  HPI/Events of Note  Oliguria - Bladder scan with no residual.   eICU Interventions  Plan: 1. Bolus with 0.9 NaCl 500 mL IV over 1 hour now.      Intervention Category Major Interventions: Other:  Deina Lipsey Dennard Nip 05/12/2020, 1:05 AM

## 2020-05-12 NOTE — Progress Notes (Signed)
RT attempted twice to get ABG but was unable to do so. Dr. Alfredo Bach is aware and DC order.

## 2020-05-12 NOTE — Significant Event (Addendum)
OVERNIGHT COVERAGE CRITICAL CARE PROGRESS NOTE  CTSP re: increasing oxygen requirement.  The patient is currently on 45 LPM + 100% NRB with SpO2 85%.  Increasing flow to 60 LPM, resulted in improvement to SpO2 92%.  CXR and ABG PENDING.  Daughter at the bedside.  She is aware of the events leading up to her hospitalization.  She states that the patient was originally made a DNR, specifically to avoid CPR and chest compressions.  However, the patient's daughter now adds that she feels that the patient should not be intubated in the event of further decline in respiratory status.  She states that she understands that this means that the patient has "very little chance of recovering and will most likely die during this hospitalization" and "the main priority now [should be] to make sure she is not anxious or suffering".  She would like for ongoing care to continue but with assurances of medications to relieve any sense of dyspnea or anxiety.  Consequently, we will continue on the current course of therapy with no escalation of care.  The patient is currently on fentanyl PRN and Valium q 8 hr scheduled.  Will add Ativan PRN.  CODE STATUS: DNR/DNI with COMFORT MEASURES  Marcelle Smiling, MD Board Certified by the ABIM, Pulmonary Diseases & Critical Care Medicine

## 2020-05-12 NOTE — Plan of Care (Signed)

## 2020-05-12 NOTE — Progress Notes (Addendum)
eLink Physician-Brief Progress Note Patient Name: Yolanda Shaw DOB: Jun 10, 1931 MRN: [id number]   Date of Service  05/12/2020  HPI/Events of Note  Called d/t increasing O2 requirements. Now on HFNC + NRB mask with sat = 87-90%. Patient is DNR/DNI, however, daughter is in the room and thinking about intubation.   eICU Interventions  Plan: 1. ABG STAT. 2. Portable CXR STAT. 3. Will request that the PCCM ground team evaluate the patient at bedside.     Intervention Category Major Interventions: Other:  Lenell Antu 05/12/2020, 4:30 AM

## 2020-05-12 NOTE — Progress Notes (Signed)
Hospital Liaison GIP note for current hospice patient who was admitted to [facility] on 5.5.22 with Acute Respiratory Failure.  Patient remains in the CCU. Over night, patient's respiratory status declined requiring patient to be placed on HHFNC at 44L/M with oxygen saturations 88%.  At [phone], patient continued with oxygen saturations down to 83% and oxygen increased to 60L/m.  Patient also remains on 100% Non-rebreather mask.  Sats up to 92% with changes.  Dr. Ardeth Perfect had a discussion with patient's daughter at the bedside to discuss goals of care in light of the patients decline.  After discussion with Dr. Ardeth Perfect,  the daughter requested patient be made DNR and transition to full comfort measures with the understanding that this means that the patient has "very little chance of recovering and will most likely die during this hospitalization" and "the main priority now [should be] to make sure she is not anxious or suffering".  She would like for ongoing care to continue but with assurances of medications to relieve any sense of dyspnea or anxiety.  Consequently, we will continue on the current course of therapy with no escalation of care.  The patient is currently on fentanyl PRN and scheduled Valium q 8 hr.    Ativan prn was added with a dose of 1mg  IV received at 1032 this am for anxiety/respiratory distress. Patient remains on scheduled Valium 2.5mg  IV every 8 hours. Amiodorone and Heparin discontinued.  Patient is unresponsive.  Anticipate hospital death as patient is actively dying.  Significant Hospital Events: Respiratory arrest requiring intubation. Including procedures, antibiotic start and stop dates in addition to other pertinent events   5/5 > admit  Micro Data: COVID 5/5 >  Sputum 5/5 >  Antibiotics: Zosyn 5/5 > 5/6  Interim History / Subjective:  Overnight had worsening VS, made full comfort care. Family at bedside this morning- daughter and s/o, son and wife. Grandsons visited  overnight.  Objective   Blood pressure (!) 172/84, pulse 100, temperature (!) 96.5 F (35.8 C), temperature source Axillary, resp. rate (!) 22, height 5\' 7"  (1.702 m), weight 41.4 kg, SpO2 90 %.    >  FiO2 (%):  [100 %] 100 %     Intake/Output Summary (Last 24 hours) at 05/12/2020 0828 Last data filed at 05/12/2020 0600    Gross per 24 hour  Intake 1248.34 ml  Output 160 ml  Net 1088.34 ml       Filed Weights   05/20/2020 1845 05/12/20 0352  Weight: 48 kg 41.4 kg    Examination:   General: frail cachectic elderly woman lying in bed in NAD, appears comfortable Neuro: asleep, not arousing during examination HEENT: temporal wasting Cardiovascular: S1S2, RRR Lungs: controlled work of breathing, on NRB and HFNC, no tachypnea. Few rhonchi Abdomen: thin, soft, NT Musculoskeletal: no peripheral edema, no cyanosis Skin: very thin skin, wounds not examined  Resolved Hospital Problem list     Assessment & Plan:   Acute hypoxic respiratory failure due to aspiration while eating. Recurrent aspiration episode on 5/7. Likely has silent aspiration and poor sensation in her throat- wasn't gagging on ETT and isn't trying to clear her throat when she has retained secretions on previous exams.  Life threating respiratory failure, saturating in 70s on NRB.  - Agree with comfort focused care. Her family understands she is terminal.  Hx anxiety, dementia with behavioral disturbances. - Home meds transitioned to IV- depakote, seroquel switched to haldol, Xanax to ativan.  -fentanyl, ativan PRN for pain, anxiety,  air hunger.  Decubitus ulcers, present at admission -wet to dry dressings -turn for comfort  Failure to thrive, severe protein energy malnutrition  GoC -Full comfort-focused care is appropriate. Liberalized visitation. She is actively dying.    Diet:  NPO Pain/Anxiety/Delirium protocol (if indicated): No VAP protocol (if indicated): Not indicated DVT prophylaxis:  Subcutaneous Heparin GI prophylaxis: N/A Glucose control:  SSI No Central venous access:  N/A Arterial line:  N/A Foley:  N/A Mobility:  OOB  PT consulted: N/A Last date of multidisciplinary goals of care discussion: daughter & son updated 5/8 at bedside Code Status:  DNR ,  MOST signed 5/7- daughter has copy Disposition: Expect hospital death.   CBC:  WBC 9.5, RBC 3.76, Hgb 12.3, Hct 37.4, Platelets 185 Troponin: 36 Heparin level: 0.74  Will continue to follow through final disposition.  Norris Cross RN Clinical Nurse Liaison [facility] [phone]

## 2020-05-12 NOTE — Progress Notes (Signed)
   05/12/20 1226  Clinical Encounter Type  Visited With Patient and family together  Visit Type Patient actively dying;Spiritual support  Referral From Nurse  Consult/Referral To Chaplain  Spiritual Encounters  Spiritual Needs Emotional;Prayer  Stress Factors  Family Stress Factors Major life changes;Loss  Ms. Lyttle Nurse called and informed she is on comfort care and the family requested the chaplain to come and have prayer.  Offered comfort, support, and prayer.  Very heartfelt and the family was pleased.  Chaplain Rumaldo Difatta Morgan-Simpson [phone]

## 2020-05-12 NOTE — Progress Notes (Signed)
NAME:  Yolanda Shaw, MRN:  [id number], DOB:  [birth year], LOS: 3 ADMISSION DATE:  05/17/2020, CONSULTATION DATE:  05/16/2020 REFERRING MD:  Teg[place] CHIEF COMPLAINT:  Hypoxia   History of Present Illness:  Yolanda Shaw is a 87 y.o. F with PMH of Dementia, HTN, multiple fx's of pelvis, anxiety, AKI.  She presented to [facility] ED 5/5 with respiratory distress and hypoxic respiratory failure.  EMS was called to her home for unresponsiveness and upon their arrival, she did have a pulse but required BVM.  Sats were in the 40's.  They attempted intubation but met resistance.  She was brought to ED where she was later intubated.  There was a concern for foreign body aspiration.  PCCM was called for bedside bronchoscopy and admission.  Dr. Carlis Abbott performed bedside bronch but there was no foreign body found.  Plan was to admit her to the ICU and once paralytics from intubation are worn off, goa head with extubation if she meets criteria.  Per discussion with family, pt does have a hx of aspiration and they feel she could have aspirated her baby food this evening since she was fine prior to eating, then suddenly began to have distress.  They also note that she enrolled with hospice in November 2021 due to being bed ridden and multiple medical problems.  Daughter [name] initially asked for full code but after further discussions, agreed to continued supportive care with mechanical ventilation, using ACLS drugs as needed; however, NO CPR due to the risks of further harm that come with this.   Pertinent  Medical History   has a past medical history of AKI (acute kidney injury) ([place]), Anemia, Anxiety, Arthritis, Bilateral pubic rami fractures ([place]) (06/29/2014), Closed fracture of right proximal humerus, Closed stable fracture of multiple pubic rami ([place]) (02/17/2016), Dementia ([place]), Dementia with behavioral disturbance ([place]), Essential hypertension, Fracture of left pubis ([place]), Ischium fracture ([place]), Pelvic fracture ([place])  (06/30/2014), Pressure ulcer (06/30/2014), Thrombocytopenia ([place]) (06/30/2014), and Tremor (06/30/2014).  Significant Hospital Events: Including procedures, antibiotic start and stop dates in addition to other pertinent events   5/5 > admit  Micro Data: COVID 5/5 >  Sputum 5/5 >  Antibiotics: Zosyn 5/5 > 5/6  Interim History / Subjective:  Overnight had worsening VS, made full comfort care. Family at bedside this morning- daughter and s/o, son and wife. Grandsons visited overnight.  Objective   Blood pressure (!) 172/84, pulse 100, temperature (!) 96.5 F (35.8 C), temperature source Axillary, resp. rate (!) 22, height _0  (1.702 m), weight 41.4 kg, SpO2 90 %.    FiO2 (%):  [100 %] 100 %   Intake/Output Summary (Last 24 hours) at 05/12/2020 0828 Last data filed at 05/12/2020 0600 Gross per 24 hour  Intake 1248.34 ml  Output 160 ml  Net 1088.34 ml   Filed Weights   05/14/2020 1845 05/12/20 0352  Weight: 48 kg 41.4 kg    Examination:   General: frail cachectic elderly woman lying in bed in NAD, appears comfortable Neuro: asleep, not arousing during examination HEENT: temporal wasting Cardiovascular: S1S2, RRR Lungs: controlled work of breathing, on NRB and HFNC, no tachypnea. Few rhonchi Abdomen: thin, soft, NT Musculoskeletal: no peripheral edema, no cyanosis Skin: very thin skin, wounds not examined  Resolved Hospital Problem list     Assessment & Plan:   Acute hypoxic respiratory failure due to aspiration while eating. Recurrent aspiration episode on 5/7. Likely has silent aspiration and poor sensation in her throat- wasn't gagging on ETT  and isn't trying to clear her throat when she has retained secretions on previous exams.  Life threating respiratory failure, saturating in 70s on NRB.  - Agree with comfort focused care. Her family understands she is terminal.  Hx anxiety, dementia with behavioral disturbances. - Home meds transitioned to IV- depakote, seroquel  switched to haldol, Xanax to ativan.  -fentanyl, ativan PRN for pain, anxiety, air hunger.  Decubitus ulcers, present at admission -wet to dry dressings -turn for comfort  Failure to thrive, severe protein energy malnutrition  GoC -Full comfort-focused care is appropriate. Liberalized visitation. She is actively dying.  Best practice (right click and "Reselect all SmartList Selections" daily)  Diet:  NPO Pain/Anxiety/Delirium protocol (if indicated): No VAP protocol (if indicated): Not indicated DVT prophylaxis: Subcutaneous Heparin GI prophylaxis: N/A Glucose control:  SSI No Central venous access:  N/A Arterial line:  N/A Foley:  N/A Mobility:  OOB  PT consulted: N/A Last date of multidisciplinary goals of care discussion: daughter & son updated 5/8 at bedside Code Status:  DNR , [place] MOST signed 5/7- daughter has copy Disposition: ICU  Labs   CBC: Recent Labs  Lab 05/10/2020 2003 05/20/2020 2123 05/21/2020 2321 05/10/20 0212 05/12/20 0036  WBC  --  5.4  --  12.6* 9.5  NEUTROABS  --  4.5  --   --   --   HGB 12.6 13.2 12.2 13.2 12.3  HCT 37.0 42.3 36.0 41.2 37.4  MCV  --  105.0*  --  102.5* 99.5  PLT  --  PLATELET CLUMPS NOTED ON SMEAR, UNABLE TO ESTIMATE  --  146* 412    Basic Metabolic Panel: Recent Labs  Lab 05/12/2020 2003 05/15/2020 2123 05/08/2020 2321 05/10/20 0212 05/11/20 2316  NA 140 138 137 138 136  K 3.4* 4.7 4.6 5.0 4.4  CL  --  104  --  103 104  CO2  --  22  --  25 21*  GLUCOSE  --  142*  --  126* 101*  BUN  --  16  --  17 20  CREATININE  --  0.55  --  0.56 0.64  CALCIUM  --  8.0*  --  8.3* 8.5*  MG  --   --   --  2.0 2.0  PHOS  --   --   --  3.5  --       Julian Hy, DO 05/12/20 8:28 AM [place] [facility]

## 2020-05-16 ENCOUNTER — Telehealth: Payer: Self-pay | Admitting: Critical Care Medicine

## 2020-05-16 NOTE — Telephone Encounter (Signed)
Patient daughter called back - does not want to pursue the accidental death policies - closing message -pr

## 2020-06-05 NOTE — Progress Notes (Signed)
Patient went asystole at 0018. Magdalene River, RN and Rebbeca Paul, RN listened for breath and heart sounds. None were auscultated. Family at bedside. ELink notified.

## 2020-06-05 DEATH — deceased

## 2020-07-05 NOTE — Discharge Summary (Signed)
Physician Discharge Summary  Patient ID: Yolanda Shaw MRN: [id number] DOB/AGE: 85-Dec-1933 85 y.o.  Admit date: 05/10/2020 Discharge date: 06/07/2020  Admission Diagnoses: Acute hypoxic respiratory failure Respiratory arrest due to aspiration Aspiration pneumonitis vs pneumonia Decubitus ulcers Dementia with behavioral disturbances Failure to thrive anxiety  Discharge Diagnoses:  Principal Problem:   Respiratory failure with hypoxia (HCC) Active Problems:   Dementia without behavioral disturbance (HCC)   Aspiration pneumonia (HCC)   Encounter for postanesthesia care   Pressure injury of skin Respiratory arrest due to aspiration  Discharged Condition: deceased  Hospital Course: [name] was admitted after an aspiration episode at home with respiratory arrest requiring BVM with EMS. She was intubated in the ED and underwent bronchoscopy to evaluate for retained foreign bodies. None were found. She was extubated the following morning and weaned down on oxygen, but continued to require supplemental O2. Due to family's request she was evaluated by SLP that day in preparation for going home. She had an aspiration episode causing rapidly increasing oxygen requirements and refractory hypoxia. Family understood that her aspiration episodes were likely chronic and previously undiagnosed, and this would not be a reversible condition. They decided to withdraw care and she expired on 2020/05/23 at 12:18AM.  Wound care was consulted to manage decubitus ulcers that were present on admission.  Consults:  Wound care SLP  Significant Diagnostic Studies:  ABG    Component Value Date/Time   PHART 7.421 06/04/2020 2321   PCO2ART 34.8 05/15/2020 2321   PO2ART 80 (L) 05/30/2020 2321   HCO3 22.9 05/12/2020 2321   TCO2 24 05/28/2020 2321   ACIDBASEDEF 1.0 05/18/2020 2321   O2SAT 97.0 05/24/2020 2321    BMP Latest Ref Rng & Units 05/11/2020 05/10/2020 05/06/2020  Glucose 70 - 99 mg/dL 696(V) 893(Y) -  BUN  8 - 23 mg/dL 20 17 -  Creatinine 1.01 - 1.00 mg/dL 7.51 0.25 -  Sodium 852 - 145 mmol/L 136 138 137  Potassium 3.5 - 5.1 mmol/L 4.4 5.0 4.6  Chloride 98 - 111 mmol/L 104 103 -  CO2 22 - 32 mmol/L 21(L) 25 -  Calcium 8.9 - 10.3 mg/dL 7.7(O) 8.3(L) -     CXR 05/12/20: IMPRESSION: 1. Suboptimal due to kyphotic positioning. 2. Extubated. Increased veiling opacity at both lung bases compatible with increasing pleural effusions with atelectasis or consolidation. No pneumothorax or pulmonary edema is evident.   Treatments:  Respiratory therapy-- MV, CPT, NST, aggressive noninvasive oxygen support Antibiotics IVF Wound care   Disposition: funeral home of the family's choosing    Signed: Steffanie Dunn 06/07/2020, 7:03 AM
